# Patient Record
Sex: Male | Born: 1945 | Race: White | Hispanic: No | Marital: Married | Smoking: Never smoker
Health system: Southern US, Community
[De-identification: ages and names within clinical notes are randomized; demographics above are authoritative.]

## PROBLEM LIST (undated history)

## (undated) DIAGNOSIS — Z96653 Presence of artificial knee joint, bilateral: Secondary | ICD-10-CM

## (undated) DIAGNOSIS — N320 Bladder-neck obstruction: Secondary | ICD-10-CM

## (undated) DIAGNOSIS — N400 Enlarged prostate without lower urinary tract symptoms: Secondary | ICD-10-CM

## (undated) DIAGNOSIS — G43909 Migraine, unspecified, not intractable, without status migrainosus: Secondary | ICD-10-CM

## (undated) DIAGNOSIS — M179 Osteoarthritis of knee, unspecified: Secondary | ICD-10-CM

## (undated) DIAGNOSIS — E559 Vitamin D deficiency, unspecified: Secondary | ICD-10-CM

## (undated) DIAGNOSIS — Z973 Presence of spectacles and contact lenses: Secondary | ICD-10-CM

## (undated) DIAGNOSIS — M1710 Unilateral primary osteoarthritis, unspecified knee: Secondary | ICD-10-CM

## (undated) DIAGNOSIS — R399 Unspecified symptoms and signs involving the genitourinary system: Secondary | ICD-10-CM

## (undated) DIAGNOSIS — Z974 Presence of external hearing-aid: Secondary | ICD-10-CM

## (undated) DIAGNOSIS — N529 Male erectile dysfunction, unspecified: Secondary | ICD-10-CM

## (undated) DIAGNOSIS — H409 Unspecified glaucoma: Secondary | ICD-10-CM

## (undated) DIAGNOSIS — E785 Hyperlipidemia, unspecified: Secondary | ICD-10-CM

## (undated) HISTORY — DX: Osteoarthritis of knee, unspecified: M17.9

## (undated) HISTORY — PX: KNEE ARTHROSCOPY W/ MENISCECTOMY: SHX1879

## (undated) HISTORY — DX: Male erectile dysfunction, unspecified: N52.9

## (undated) HISTORY — DX: Presence of artificial knee joint, bilateral: Z96.653

## (undated) HISTORY — DX: Migraine, unspecified, not intractable, without status migrainosus: G43.909

## (undated) HISTORY — PX: APPENDECTOMY: SHX54

## (undated) HISTORY — PX: HERNIA REPAIR: SHX51

## (undated) HISTORY — PX: CARDIAC CATHETERIZATION: SHX172

## (undated) HISTORY — PX: VASECTOMY: SHX75

## (undated) HISTORY — DX: Unilateral primary osteoarthritis, unspecified knee: M17.10

---

## 1979-03-09 HISTORY — PX: VASECTOMY: SHX75

## 1979-03-09 HISTORY — PX: APPENDECTOMY: SHX54

## 2001-03-30 ENCOUNTER — Ambulatory Visit (HOSPITAL_COMMUNITY): Admission: RE | Admit: 2001-03-30 | Discharge: 2001-03-30 | Payer: Self-pay | Admitting: Gastroenterology

## 2003-11-23 ENCOUNTER — Encounter: Admission: RE | Admit: 2003-11-23 | Discharge: 2004-02-21 | Payer: Self-pay | Admitting: Family Medicine

## 2004-11-10 ENCOUNTER — Emergency Department (HOSPITAL_COMMUNITY): Admission: EM | Admit: 2004-11-10 | Discharge: 2004-11-10 | Payer: Self-pay | Admitting: Emergency Medicine

## 2007-11-09 ENCOUNTER — Ambulatory Visit (HOSPITAL_BASED_OUTPATIENT_CLINIC_OR_DEPARTMENT_OTHER): Admission: RE | Admit: 2007-11-09 | Discharge: 2007-11-09 | Payer: Self-pay | Admitting: Orthopedic Surgery

## 2007-11-09 ENCOUNTER — Encounter (INDEPENDENT_AMBULATORY_CARE_PROVIDER_SITE_OTHER): Payer: Self-pay | Admitting: Orthopedic Surgery

## 2008-01-26 ENCOUNTER — Inpatient Hospital Stay (HOSPITAL_BASED_OUTPATIENT_CLINIC_OR_DEPARTMENT_OTHER): Admission: RE | Admit: 2008-01-26 | Discharge: 2008-01-26 | Payer: Self-pay | Admitting: Cardiology

## 2008-10-04 ENCOUNTER — Ambulatory Visit (HOSPITAL_BASED_OUTPATIENT_CLINIC_OR_DEPARTMENT_OTHER): Admission: RE | Admit: 2008-10-04 | Discharge: 2008-10-04 | Payer: Self-pay | Admitting: Orthopedic Surgery

## 2010-10-18 LAB — POCT HEMOGLOBIN-HEMACUE: Hemoglobin: 15.7 g/dL (ref 13.0–17.0)

## 2010-11-20 NOTE — Cardiovascular Report (Signed)
NAME:  Nicholas Mahoney, Nicholas Mahoney NO.:  [id number]   MEDICAL RECORD NO.:  [id number]          PATIENT TYPE:  OIB   LOCATION:  1961                         FACILITY:  [facility]   PHYSICIAN:  Jake Bathe, MD      DATE OF BIRTH:  July 01, 1946   DATE OF PROCEDURE:  01/26/2008  DATE OF DISCHARGE:                            CARDIAC CATHETERIZATION   PROCEDURES:  1. Left heart catheterization.  2. Selective coronary angiography.  3. Left ventriculogram.   INDICATIONS:  A 65 year old male with symptoms of angina and abnormal  nuclear stress test showing inferior wall ischemia of mild degree with  normal ejection fraction.  He also has a small LDL particle size.   PROCEDURE DETAILS:  Informed consent was obtained.  The patient was  placed on the catheterization table and prepped in a sterile fashion.  Lidocaine 1% was used for infiltration of the right groin for local  anesthesia.  After visualizing the femoral head with fluoroscopy, a 4-  French sheath was placed in the right femoral artery using the modified  Seldinger technique.  A Judkins left #4 catheter was used to selectively  cannulate the left main artery.  Multiple views with hand injection of  Omnipaque were obtained.  This catheter was exchanged over the wire for  a no torque Williams right catheter, which was used to selectively  cannulate the right coronary artery.  Multiple views with hand injection  of Omnipaque were obtained.  This catheter was exchanged for an angled  pigtail over the wire, which was used across the aortic valve into the  left ventricle.  Hemodynamics obtained.  Utilizing 30 mL of dye with  power injection in the RAO position, the left ventriculogram was  obtained.  Following the procedure, sheath and catheters were removed.  Manual compression held.  The patient tolerated the procedure well with  no difficulties.   FINDINGS:  1. Left main artery - no angiographically significant coronary artery     disease.  2. Left anterior descending artery - no angiographically significant      coronary artery disease, two large diagonal branches wraps around      the apex.  3. Circumflex artery, no angiographically significant coronary artery      disease.  There are three obtuse marginal branches.  The first of      which has a very proximal takeoff.  If anything, there was very      minor narrowing of the ostium of the circumflex less than 10%.  4. Right coronary artery - this is a large dominant vessel giving rise      to the PDA.  There is no angiographically significant coronary      artery disease.  5. Left ventriculogram - normal left ventricular ejection fraction      estimated at 60% with no wall motion abnormalities.  No significant      mitral regurgitation.  6. Hemodynamics.  Left ventricular systolic pressure was 98 with a      left ventricular end-diastolic pressure of 13 mmHg.  Aortic      pressure was 98/52 with  a mean pressures at 72 mmHg.  There was no      gradient between the left ventricle and aorta.   IMPRESSION:  1. No angiographically significant coronary artery disease.  2. Normal left ventricular ejection fraction with no wall motion      abnormalities.  3. False positive nuclear stress test with no evidence of CAD in the      inferior wall distribution.   PLAN:  We will continue Zocor to hopefully increase LDL particle size,  may need to add Niaspan or fenofibrate.  We will be rechecking lipids in  the clinic.  Okay with me to stop metoprolol given that there is no  evidence of significant stenosis.  We will see back in 2 weeks.      Jake Bathe, MD  Electronically Signed     MCS/MEDQ  D:  01/26/2008  T:  01/27/2008  Job:  [id number]   cc:   Pam Drown, M.D.

## 2010-11-20 NOTE — Op Note (Signed)
NAME:  Nicholas Mahoney, Nicholas Mahoney NO.:  [id number]   MEDICAL RECORD NO.:  [id number]          PATIENT TYPE:  AMB   LOCATION:  DSC                          FACILITY:  [facility]   PHYSICIAN:  Robert A. Thurston Hole, M.D. DATE OF BIRTH:  Aug 22, 1945   DATE OF PROCEDURE:  11/09/2007  DATE OF DISCHARGE:                               OPERATIVE REPORT   PREOPERATIVE DIAGNOSES:  1. 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  2. Right knee possible gout.   POSTOPERATIVE DIAGNOSES:  1. 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  2. Right knee possible gout.   PROCEDURES:  1. Right knee examination under anesthesia followed by orthoscopic      partial medial and lateral meniscectomies.  2. Right knee chondroplasty with partial synovectomy.  3. Right knee synovial biopsy.   SURGEON:  Elana Alm. Thurston Hole, MD   ASSISTANT:  Julien Girt, PA   ANESTHESIA:  General   OPERATIVE TIME:  45 minutes.   COMPLICATIONS:  None.   INDICATION FOR PROCEDURE:  Mr. Roethler is a 65 year old man who has had  significant right knee pain for the past 3 months, much worse over the  past 2-3 weeks with examined x-rays consistent with meniscal tearing,  chondromalacia, and synovitis.  He has failed conservative care, and he  is now to undergo arthroscopy.   DESCRIPTION:  Mr. Graeff was brought to the operating room on Nov 09, 2007, after a knee block was placed in holding area by anesthesia.  He  was placed on operating table in supine position.  His right knee was  examined under anesthesia.  He had full range of motion with stable  ligamentous exam with normal patellar tracking.  The right leg was  prepped using sterile DuraPrep and draped using sterile technique.  Originally, through an anterolateral portal, the arthroscope with a pump  attached was placed and through an anteromedial portal, an arthroscopic  probe  was placed.  On initial inspection of the medial compartment, he  had 30-40% grade 3 chondromalacia with some articular cartilage  crystalline type deposits which was debrided and some of these were sent  for samples to evaluate him for possible gout.  Medial meniscus showed  tearing in the posterior and medial horn, of which 50%  was resected  back to stable rim.  This also had crystalline type deposits and it is  well and portions of this were also sent for biopsies as well.  Intercondylar notch inspected.  Anterior and posterior crucial ligaments  were normal.  Lateral compartment was inspected.  The articular  cartilage showed 30% grade 3 chondromalacia, which was debrided.  Lateral meniscus tearing of posterolateral and anterior horn of which 30-  40% was resected back to a stable rim.  Patellofemoral joint showed 30-  40% grade 3 chondromalacia, which was debrided.  The patella tracked  normally.  Moderate synovitis and medial and lateral gutters were  debrided and again, the synovial biopsies were taken to evaluate for  crystal deposits and  gout.  Otherwise, the medial and lateral gutters  were free of pathology.  At this point, it was felt that all pathology  have been satisfactorily addressed.  The instruments were removed.  The  portals were closed with 3-0 nylon suture and injected with 0.25%  Marcaine with epinephrine and 4 mg of morphine.  Sterile dressings were  applied, and the patient awakened and taken to the recovery room in  stable condition.   FOLLOWUP CARE:  Mr. Oelkers will be followed as an outpatient on  Vicodin and Indocin.  He will be seen back in the office in a week for  sutures out and followup.      Robert A. Thurston Hole, M.D.  Electronically Signed     RAW/MEDQ  D:  11/09/2007  T:  11/09/2007  Job:  [id number]

## 2010-11-20 NOTE — Op Note (Signed)
NAME:  Nicholas Mahoney, Nicholas Mahoney NO.:  [id number]   MEDICAL RECORD NO.:  [id number]          PATIENT TYPE:  AMB   LOCATION:  DSC                          FACILITY:  [facility]   PHYSICIAN:  Robert A. Thurston Hole, M.D. DATE OF BIRTH:  10-Jun-1946   DATE OF PROCEDURE:  10/04/2008  DATE OF DISCHARGE:                               OPERATIVE REPORT   PREOPERATIVE DIAGNOSES:  1. Left knee medial and lateral meniscal tears with chondromalacia and      synovitis.  2. Left knee chondrocalcinosis.   POSTOPERATIVE DIAGNOSES:  1. Left knee medial and lateral meniscal tears with chondromalacia and      synovitis.  2. Left knee chondrocalcinosis.   PROCEDURE:  1. Left knee examination under anesthesia followed by arthroscopic      partial medial and lateral meniscectomies.  2. Left knee chondroplasty with partial synovectomy.   SURGEON:  Elana Alm. Thurston Hole, M.D.   ASSISTANT:  Julien Girt, P.A.   ANESTHESIA:  General.   OPERATIVE TIME:  30 minutes.   COMPLICATIONS:  None.   INDICATION FOR PROCEDURE:  Nicholas Mahoney is a 65 year old gentleman who  has had significant left knee pain for the past 3-4 months, increasing  in nature with examined x-rays consistent with meniscal tearing with  chondromalacia, chondrocalcinosis, and synovitis.  He has failed  conservative care, and he is now to undergo arthroscopy.   DESCRIPTION:  Nicholas Mahoney was brought to the operating room on October 04, 2008, placed on operating table in supine position.  After a knee  block has been placed in holding area by anesthesia.  He was placed in  general anesthesia.  His left knee was examined.  He had full range of  motion.  He has stable at ligamentous exam with normal patellar  tracking.  He received Ancef 1 g IV preoperatively for prophylaxis.  His  left leg was prepped using sterile DuraPrep and draped using sterile  technique.  Originally, through an anterolateral portal, the arthroscope  with a pump  attached was placed and through an anteromedial portal, an  arthroscopic probe was placed.  On initial inspection of the medial  compartment, he was found to have 30-40% grade 3 chondromalacia which  was debrided.  Medial meniscus showed tearing in the posterior and  medial horn and along with calcific deposits consistent with  chondrocalcinosis.  A 50% of posterior medial horn was resected back to  a stable rim.  Intercondylar notch inspected.  The anterior and  posterior crucial ligaments were normal.  Lateral compartment was  inspected.  Deposits which 30-40% was resected back to a stable rim.  Patellofemoral joint showed grade 1 and 2 chondromalacia.  The patella  tracked normally.  Moderate synovitis and medial and lateral gutters  were debrided otherwise just free of pathology.  After this was done, it  was felt that all pathology have been satisfactorily addressed.  The  instruments were removed.  The portals were closed with 3-0 nylon  suture.  Sterile dressings were applied.  The patient awakened and taken  to the recovery room in stable condition.   FOLLOWUP  CARE:  Nicholas Mahoney will be followed as an outpatient on  Vicodin and Mobic.  Seen back in the office in a week for sutures out  and followup.      Robert A. Thurston Hole, M.D.  Electronically Signed     RAW/MEDQ  D:  10/04/2008  T:  10/05/2008  Job:  [id number]

## 2010-11-23 NOTE — Procedures (Signed)
[facility]  Patient:    Nicholas Mahoney, Nicholas Mahoney Visit Number: [id number] MRN: [id number]          Service Type: END Location: ENDO Attending Physician:  Orland Mustard Proc. Date: 03/30/01 Admit Date:  03/30/2001   CC:         Heather Roberts, M.D.   Procedure Report  PROCEDURE:  Colonoscopy.  SCOPE:  Olympus adult video colonoscope.  MEDICATIONS:  Fentanyl 75 mcg, Versed 6 mg IV.  INDICATION:  Colon cancer screening.  DESCRIPTION OF PROCEDURE:  The procedure had been explained to the patient and consent obtained.  With the patient in the left lateral decubitus position, the Olympus adult video colonoscope was inserted and advanced under direct visualization.  The prep was quite good.  We were able to reach the cecum without difficulty.  The appendiceal orifice and the ileocecal valve were seen.  The scope was withdrawn.  The cecum, ascending colon, hepatic flexure, transverse colon, splenic flexure, descending, and sigmoid colon were seen well upon removal.  No polyps or other lesions were seen.  There was no significant diverticulosis.  Only small if any internal hemorrhoids.  The scope was withdrawn.  The patient tolerated the procedure well.  ASSESSMENT:  Essentially normal colonoscopy.  PLAN:  Routine follow-up with yearly Hemoccults.  Sigmoid in five years. Consider colon in 10 years. Attending Physician:  Orland Mustard DD:  03/30/01 TD:  03/30/01 Job: [id number]

## 2011-07-23 DIAGNOSIS — F4321 Adjustment disorder with depressed mood: Secondary | ICD-10-CM | POA: Diagnosis not present

## 2011-08-12 DIAGNOSIS — D4959 Neoplasm of unspecified behavior of other genitourinary organ: Secondary | ICD-10-CM | POA: Diagnosis not present

## 2011-08-15 DIAGNOSIS — N139 Obstructive and reflux uropathy, unspecified: Secondary | ICD-10-CM | POA: Diagnosis not present

## 2011-08-15 DIAGNOSIS — N529 Male erectile dysfunction, unspecified: Secondary | ICD-10-CM | POA: Diagnosis not present

## 2011-08-22 DIAGNOSIS — L570 Actinic keratosis: Secondary | ICD-10-CM | POA: Diagnosis not present

## 2011-09-19 DIAGNOSIS — J069 Acute upper respiratory infection, unspecified: Secondary | ICD-10-CM | POA: Diagnosis not present

## 2011-10-03 DIAGNOSIS — H18519 Endothelial corneal dystrophy, unspecified eye: Secondary | ICD-10-CM | POA: Diagnosis not present

## 2011-11-28 DIAGNOSIS — Z0389 Encounter for observation for other suspected diseases and conditions ruled out: Secondary | ICD-10-CM | POA: Diagnosis not present

## 2011-11-28 DIAGNOSIS — E785 Hyperlipidemia, unspecified: Secondary | ICD-10-CM | POA: Diagnosis not present

## 2011-11-28 DIAGNOSIS — N529 Male erectile dysfunction, unspecified: Secondary | ICD-10-CM | POA: Diagnosis not present

## 2011-12-18 DIAGNOSIS — M109 Gout, unspecified: Secondary | ICD-10-CM | POA: Diagnosis not present

## 2011-12-18 DIAGNOSIS — M779 Enthesopathy, unspecified: Secondary | ICD-10-CM | POA: Diagnosis not present

## 2012-03-05 DIAGNOSIS — L819 Disorder of pigmentation, unspecified: Secondary | ICD-10-CM | POA: Diagnosis not present

## 2012-03-05 DIAGNOSIS — Z85828 Personal history of other malignant neoplasm of skin: Secondary | ICD-10-CM | POA: Diagnosis not present

## 2012-03-05 DIAGNOSIS — D239 Other benign neoplasm of skin, unspecified: Secondary | ICD-10-CM | POA: Diagnosis not present

## 2012-03-05 DIAGNOSIS — L570 Actinic keratosis: Secondary | ICD-10-CM | POA: Diagnosis not present

## 2012-03-05 DIAGNOSIS — L821 Other seborrheic keratosis: Secondary | ICD-10-CM | POA: Diagnosis not present

## 2012-03-05 DIAGNOSIS — D1801 Hemangioma of skin and subcutaneous tissue: Secondary | ICD-10-CM | POA: Diagnosis not present

## 2012-03-05 DIAGNOSIS — D485 Neoplasm of uncertain behavior of skin: Secondary | ICD-10-CM | POA: Diagnosis not present

## 2012-03-30 DIAGNOSIS — Z23 Encounter for immunization: Secondary | ICD-10-CM | POA: Diagnosis not present

## 2012-04-27 DIAGNOSIS — Z79899 Other long term (current) drug therapy: Secondary | ICD-10-CM | POA: Diagnosis not present

## 2012-04-27 DIAGNOSIS — Z0000 Encounter for general adult medical examination without abnormal findings: Secondary | ICD-10-CM | POA: Diagnosis not present

## 2012-04-27 DIAGNOSIS — E785 Hyperlipidemia, unspecified: Secondary | ICD-10-CM | POA: Diagnosis not present

## 2012-04-27 DIAGNOSIS — G609 Hereditary and idiopathic neuropathy, unspecified: Secondary | ICD-10-CM | POA: Diagnosis not present

## 2012-06-23 DIAGNOSIS — Z20828 Contact with and (suspected) exposure to other viral communicable diseases: Secondary | ICD-10-CM | POA: Diagnosis not present

## 2012-06-23 DIAGNOSIS — E785 Hyperlipidemia, unspecified: Secondary | ICD-10-CM | POA: Diagnosis not present

## 2012-06-23 DIAGNOSIS — G43009 Migraine without aura, not intractable, without status migrainosus: Secondary | ICD-10-CM | POA: Diagnosis not present

## 2012-06-23 DIAGNOSIS — N400 Enlarged prostate without lower urinary tract symptoms: Secondary | ICD-10-CM | POA: Diagnosis not present

## 2012-06-23 DIAGNOSIS — Z79899 Other long term (current) drug therapy: Secondary | ICD-10-CM | POA: Diagnosis not present

## 2012-06-23 DIAGNOSIS — N529 Male erectile dysfunction, unspecified: Secondary | ICD-10-CM | POA: Diagnosis not present

## 2012-06-23 DIAGNOSIS — Z131 Encounter for screening for diabetes mellitus: Secondary | ICD-10-CM | POA: Diagnosis not present

## 2012-06-23 DIAGNOSIS — M533 Sacrococcygeal disorders, not elsewhere classified: Secondary | ICD-10-CM | POA: Diagnosis not present

## 2012-07-11 DIAGNOSIS — J019 Acute sinusitis, unspecified: Secondary | ICD-10-CM | POA: Diagnosis not present

## 2012-08-24 DIAGNOSIS — N401 Enlarged prostate with lower urinary tract symptoms: Secondary | ICD-10-CM | POA: Diagnosis not present

## 2012-08-24 DIAGNOSIS — N138 Other obstructive and reflux uropathy: Secondary | ICD-10-CM | POA: Diagnosis not present

## 2012-08-25 DIAGNOSIS — R5383 Other fatigue: Secondary | ICD-10-CM | POA: Diagnosis not present

## 2012-08-25 DIAGNOSIS — R5381 Other malaise: Secondary | ICD-10-CM | POA: Diagnosis not present

## 2012-08-25 DIAGNOSIS — G4700 Insomnia, unspecified: Secondary | ICD-10-CM | POA: Diagnosis not present

## 2012-08-25 DIAGNOSIS — G43109 Migraine with aura, not intractable, without status migrainosus: Secondary | ICD-10-CM | POA: Diagnosis not present

## 2012-08-25 DIAGNOSIS — N529 Male erectile dysfunction, unspecified: Secondary | ICD-10-CM | POA: Diagnosis not present

## 2012-08-26 ENCOUNTER — Emergency Department (INDEPENDENT_AMBULATORY_CARE_PROVIDER_SITE_OTHER): Payer: Medicare Other

## 2012-08-26 ENCOUNTER — Emergency Department (INDEPENDENT_AMBULATORY_CARE_PROVIDER_SITE_OTHER)
Admission: EM | Admit: 2012-08-26 | Discharge: 2012-08-26 | Disposition: A | Discharge status: Home / Self Care | Payer: Medicare Other | Source: Home / Self Care | Attending: Family Medicine | Admitting: Family Medicine

## 2012-08-26 ENCOUNTER — Encounter (HOSPITAL_COMMUNITY): Payer: Self-pay

## 2012-08-26 DIAGNOSIS — S81009A Unspecified open wound, unspecified knee, initial encounter: Secondary | ICD-10-CM

## 2012-08-26 DIAGNOSIS — N138 Other obstructive and reflux uropathy: Secondary | ICD-10-CM | POA: Diagnosis not present

## 2012-08-26 DIAGNOSIS — N529 Male erectile dysfunction, unspecified: Secondary | ICD-10-CM | POA: Diagnosis not present

## 2012-08-26 DIAGNOSIS — S81811A Laceration without foreign body, right lower leg, initial encounter: Secondary | ICD-10-CM

## 2012-08-26 DIAGNOSIS — Z23 Encounter for immunization: Secondary | ICD-10-CM

## 2012-08-26 DIAGNOSIS — S99929A Unspecified injury of unspecified foot, initial encounter: Secondary | ICD-10-CM | POA: Diagnosis not present

## 2012-08-26 DIAGNOSIS — S8990XA Unspecified injury of unspecified lower leg, initial encounter: Secondary | ICD-10-CM | POA: Diagnosis not present

## 2012-08-26 DIAGNOSIS — S91009A Unspecified open wound, unspecified ankle, initial encounter: Secondary | ICD-10-CM | POA: Diagnosis not present

## 2012-08-26 MED ORDER — HYDROCODONE-ACETAMINOPHEN 5-325 MG PO TABS: 1.0000 | ORAL_TABLET | Freq: Three times a day (TID) | ORAL | Status: DC | PRN

## 2012-08-26 MED ORDER — ONDANSETRON 4 MG PO TBDP
4.0000 mg | ORAL_TABLET | Freq: Once | ORAL | Status: AC
  Administered 2012-08-26: 4 mg via ORAL

## 2012-08-26 MED ORDER — TETANUS-DIPHTH-ACELL PERTUSSIS 5-2.5-18.5 LF-MCG/0.5 IM SUSP
0.5000 mL | Freq: Once | INTRAMUSCULAR | Status: AC
  Administered 2012-08-26: 0.5 mL via INTRAMUSCULAR

## 2012-08-26 MED ORDER — CEPHALEXIN 500 MG PO CAPS: 500.0000 mg | ORAL_CAPSULE | Freq: Three times a day (TID) | ORAL | Status: DC

## 2012-08-26 MED ORDER — ONDANSETRON 4 MG PO TBDP
ORAL_TABLET | ORAL | Status: AC
  Filled 2012-08-26: qty 2

## 2012-08-26 MED ORDER — HYDROCODONE-ACETAMINOPHEN 5-325 MG PO TABS
ORAL_TABLET | ORAL | Status: AC
  Filled 2012-08-26: qty 1

## 2012-08-26 MED ORDER — IBUPROFEN 600 MG PO TABS: 600.0000 mg | ORAL_TABLET | Freq: Three times a day (TID) | ORAL | Status: DC | PRN

## 2012-08-26 MED ORDER — TETANUS-DIPHTH-ACELL PERTUSSIS 5-2.5-18.5 LF-MCG/0.5 IM SUSP
INTRAMUSCULAR | Status: AC
  Filled 2012-08-26: qty 0.5

## 2012-08-26 MED ORDER — HYDROCODONE-ACETAMINOPHEN 5-325 MG PO TABS
1.0000 | ORAL_TABLET | Freq: Once | ORAL | Status: AC
  Administered 2012-08-26: 1 via ORAL

## 2012-08-26 NOTE — ED Notes (Signed)
Vitals obtained by xray student 

## 2012-08-26 NOTE — ED Notes (Signed)
Volunteer for [employer], states he slipped, fell, struck right mid tibial prominence on building materials, sustained 10cm x 10 cm V shaped laceration, minimal bleeding at present, no current hematoma. NAD. Denies other injury

## 2012-08-31 NOTE — ED Provider Notes (Signed)
History     CSN: [id number]  Arrival date & time 08/26/12  1347   First MD Initiated Contact with Patient 08/26/12 1423      Chief Complaint  Patient presents with  . Extremity Laceration    (Consider location/radiation/quality/duration/timing/severity/associated sxs/prior treatment) HPI Comments: 67 y/o non diabetic, non smoker male comes with a laceration in right lower leg. Patient is a Agricultural consultant for [employer] and was standing over a bath tub when his foot slipped down hitting his right tibia against the tub causing a big cut. Patient states there was a lot of bleeding but it stooped now. He is weight bearing on right lower extremity. Needs tetanus booster. No head trauma or other body injuries.   Past Medical History  Diagnosis Date  . High cholesterol     History reviewed. No pertinent past surgical history.  History reviewed. No pertinent family history.  History  Substance Use Topics  . Smoking status: Never Smoker   . Smokeless tobacco: Not on file  . Alcohol Use: 0.0 oz/week    1-2 Glasses of wine per week      Review of Systems  HENT:       No head trauma  Eyes: Negative for visual disturbance.  Skin: Positive for wound.       As per HPI  Neurological: Negative for dizziness, seizures, syncope, weakness, numbness and headaches.  All other systems reviewed and are negative.    Allergies  Review of patient's allergies indicates no known allergies.  Home Medications   Current Outpatient Rx  Name  Route  Sig  Dispense  Refill  . aspirin 81 MG tablet   Oral   Take 81 mg by mouth daily.         . ergocalciferol (VITAMIN D2) 50000 UNITS capsule   Oral   Take 50,000 Units by mouth once a week.         . simvastatin (ZOCOR) 10 MG tablet   Oral   Take 10 mg by mouth at bedtime.         . cephALEXin (KEFLEX) 500 MG capsule   Oral   Take 1 capsule (500 mg total) by mouth 3 (three) times daily.   21 capsule   0   .  HYDROcodone-acetaminophen (NORCO/VICODIN) 5-325 MG per tablet   Oral   Take 1 tablet by mouth every 8 (eight) hours as needed for pain.   15 tablet   0   . ibuprofen (ADVIL,MOTRIN) 600 MG tablet   Oral   Take 1 tablet (600 mg total) by mouth every 8 (eight) hours as needed for pain.   20 tablet   0     BP 103/64  Pulse 66  Temp(Src) 98.1 F (36.7 C) (Oral)  Resp 20  SpO2 99%  Physical Exam  Nursing note and vitals reviewed. Constitutional: He is oriented to person, place, and time. He appears well-developed and well-nourished. No distress.  HENT:  Head: Normocephalic and atraumatic.  Eyes: EOM are normal. Pupils are equal, round, and reactive to light.  Neck: Neck supple.  Cardiovascular: Normal rate, regular rhythm and normal heart sounds.   Pulmonary/Chest: Effort normal and breath sounds normal.  Musculoskeletal:  Right lower extremity: there is a flap laceration abot 10x10cm (V shaped) located anterior to mid tibial bone. Flap is retracted exposing the muscle fascia. Mild bleeding. No obvious hematoma. Tibial bone appears intact.  Neurological: He is alert and oriented to person, place, and time.  Skin:  He is not diaphoretic.  Flap laceration in anterior mid right tibial bone as per Musculoeskeletal exam.    ED Course  LACERATION REPAIR Performed by: Sharin Grave Authorized by: Sharin Grave Consent: Verbal consent obtained. Risks and benefits: risks, benefits and alternatives were discussed Consent given by: patient Patient understanding: patient states understanding of the procedure being performed Patient consent: the patient's understanding of the procedure matches consent given Body area: lower extremity Location details: left lower leg Laceration length: 20 cm Foreign bodies: no foreign bodies Tendon involvement: none Nerve involvement: none Vascular damage: no Anesthesia: local infiltration Local anesthetic: lidocaine 1% with  epinephrine Anesthetic total: 5 ml Preparation: Patient was prepped and draped in the usual sterile fashion. Irrigation solution: saline Amount of cleaning: standard Degree of undermining: minimal Skin closure: 3-0 nylon and Steri-Strips Subcutaneous closure: 6-0 fast-absorbing plain gut Number of sutures: 19 Technique: simple Approximation: close Approximation difficulty: complex Dressing: non-adhesive packing strip Patient tolerance: Patient tolerated the procedure well with no immediate complications.   (including critical care time)  Labs Reviewed - No data to display No results found.   1. Laceration of lower leg, right       MDM  Large flap laceration s/p repair today. TDaP provided. No bone fx on x-rays. Patient was given hydrocodone/acetaminophen 325mg /5 mg and ondansetron here. Wife will drive home. Prescribed Norco, ibuprofen and cephalexin. Wound care instructions discussed with patient ans provided in writing.  Supportive care and red flags that should prompt his return discussed with patient and provided in writing.  Asked to return in 10 days for suture removal.        Sharin Grave, MD 08/31/12 [phone]

## 2012-09-07 ENCOUNTER — Encounter (HOSPITAL_COMMUNITY): Payer: Self-pay

## 2012-09-07 ENCOUNTER — Emergency Department (INDEPENDENT_AMBULATORY_CARE_PROVIDER_SITE_OTHER)
Admission: EM | Admit: 2012-09-07 | Discharge: 2012-09-07 | Disposition: A | Discharge status: Home / Self Care | Payer: Medicare Other | Source: Home / Self Care

## 2012-09-07 DIAGNOSIS — Z4802 Encounter for removal of sutures: Secondary | ICD-10-CM | POA: Diagnosis not present

## 2012-09-07 NOTE — ED Notes (Signed)
Here for wound check and poss suture removal; wound edges eschar present w reddened margins

## 2012-09-07 NOTE — ED Provider Notes (Signed)
History     CSN: [id number]  Arrival date & time 09/07/12  1321   First MD Initiated Contact with Patient 09/07/12 1330      Chief Complaint  Patient presents with  . Wound Check    (Consider location/radiation/quality/duration/timing/severity/associated sxs/prior treatment) HPI Comments: 67 year old man presents for suture removal. Approximately 12 days ago he fell from a ladder and produced a V-shaped laceration to the right shin. The wound was sutured with nonabsorbable nylon. A portion of the the superficial epidermis is not completely closed. He has been 12 days. There is mild hyperemia along the wound edges but no signs of infection, no puffiness or drainage.   Past Medical History  Diagnosis Date  . High cholesterol     History reviewed. No pertinent past surgical history.  History reviewed. No pertinent family history.  History  Substance Use Topics  . Smoking status: Never Smoker   . Smokeless tobacco: Not on file  . Alcohol Use: 0.0 oz/week    1-2 Glasses of wine per week      Review of Systems  All other systems reviewed and are negative.    Allergies  Review of patient's allergies indicates no known allergies.  Home Medications   Current Outpatient Rx  Name  Route  Sig  Dispense  Refill  . aspirin 81 MG tablet   Oral   Take 81 mg by mouth daily.         . cephALEXin (KEFLEX) 500 MG capsule   Oral   Take 1 capsule (500 mg total) by mouth 3 (three) times daily.   21 capsule   0   . ergocalciferol (VITAMIN D2) 50000 UNITS capsule   Oral   Take 50,000 Units by mouth once a week.         Marland Kitchen HYDROcodone-acetaminophen (NORCO/VICODIN) 5-325 MG per tablet   Oral   Take 1 tablet by mouth every 8 (eight) hours as needed for pain.   15 tablet   0   . ibuprofen (ADVIL,MOTRIN) 600 MG tablet   Oral   Take 1 tablet (600 mg total) by mouth every 8 (eight) hours as needed for pain.   20 tablet   0   . simvastatin (ZOCOR) 10 MG tablet   Oral  Take 10 mg by mouth at bedtime.           BP 133/81  Pulse 74  Temp(Src) 97.9 F (36.6 C) (Oral)  Resp 22  SpO2 98%  Physical Exam  Nursing note and vitals reviewed. Constitutional: He is oriented to person, place, and time. He appears well-developed and well-nourished. No distress.  Pulmonary/Chest: Effort normal.  Neurological: He is alert and oriented to person, place, and time.  Skin: Skin is warm and dry.  Psychiatric: He has a normal mood and affect.    ED Course  Procedures (including critical care time)  Labs Reviewed - No data to display No results found.   1. Visit for suture removal       MDM  All sutures were removed. Because the epidermis was not completely closed, although well approximated, I applied Steri-Strips along the wound edges. Maintain these for 3-4 more days. Try to keep it dry does; not need to apply any sort of ointment. Any signs of infection recheck promptly.         Hayden Rasmussen, NP 09/07/12 [phone]

## 2012-09-07 NOTE — ED Provider Notes (Signed)
Medical screening examination/treatment/procedure(s) were performed by resident physician or non-physician practitioner and as supervising physician I was immediately available for consultation/collaboration.   KINDL,JAMES DOUGLAS MD.   James D Kindl, MD 09/07/12 1638 

## 2012-09-22 DIAGNOSIS — N400 Enlarged prostate without lower urinary tract symptoms: Secondary | ICD-10-CM | POA: Diagnosis not present

## 2012-09-22 DIAGNOSIS — E291 Testicular hypofunction: Secondary | ICD-10-CM | POA: Diagnosis not present

## 2012-10-28 DIAGNOSIS — E785 Hyperlipidemia, unspecified: Secondary | ICD-10-CM | POA: Diagnosis not present

## 2012-10-28 DIAGNOSIS — Z79899 Other long term (current) drug therapy: Secondary | ICD-10-CM | POA: Diagnosis not present

## 2012-11-03 DIAGNOSIS — Z79899 Other long term (current) drug therapy: Secondary | ICD-10-CM | POA: Diagnosis not present

## 2012-11-03 DIAGNOSIS — E291 Testicular hypofunction: Secondary | ICD-10-CM | POA: Diagnosis not present

## 2012-11-03 DIAGNOSIS — N400 Enlarged prostate without lower urinary tract symptoms: Secondary | ICD-10-CM | POA: Diagnosis not present

## 2012-11-04 DIAGNOSIS — H2589 Other age-related cataract: Secondary | ICD-10-CM | POA: Diagnosis not present

## 2012-11-27 DIAGNOSIS — E785 Hyperlipidemia, unspecified: Secondary | ICD-10-CM | POA: Diagnosis not present

## 2012-11-27 DIAGNOSIS — Z0389 Encounter for observation for other suspected diseases and conditions ruled out: Secondary | ICD-10-CM | POA: Diagnosis not present

## 2012-11-27 DIAGNOSIS — R609 Edema, unspecified: Secondary | ICD-10-CM | POA: Diagnosis not present

## 2013-02-10 ENCOUNTER — Other Ambulatory Visit: Payer: Self-pay

## 2013-02-16 DIAGNOSIS — N400 Enlarged prostate without lower urinary tract symptoms: Secondary | ICD-10-CM | POA: Diagnosis not present

## 2013-02-16 DIAGNOSIS — E291 Testicular hypofunction: Secondary | ICD-10-CM | POA: Diagnosis not present

## 2013-02-16 DIAGNOSIS — N529 Male erectile dysfunction, unspecified: Secondary | ICD-10-CM | POA: Diagnosis not present

## 2013-03-11 DIAGNOSIS — L570 Actinic keratosis: Secondary | ICD-10-CM | POA: Diagnosis not present

## 2013-03-11 DIAGNOSIS — L819 Disorder of pigmentation, unspecified: Secondary | ICD-10-CM | POA: Diagnosis not present

## 2013-03-11 DIAGNOSIS — D1801 Hemangioma of skin and subcutaneous tissue: Secondary | ICD-10-CM | POA: Diagnosis not present

## 2013-03-11 DIAGNOSIS — Z85828 Personal history of other malignant neoplasm of skin: Secondary | ICD-10-CM | POA: Diagnosis not present

## 2013-03-11 DIAGNOSIS — L821 Other seborrheic keratosis: Secondary | ICD-10-CM | POA: Diagnosis not present

## 2013-03-11 DIAGNOSIS — D239 Other benign neoplasm of skin, unspecified: Secondary | ICD-10-CM | POA: Diagnosis not present

## 2013-03-30 DIAGNOSIS — E291 Testicular hypofunction: Secondary | ICD-10-CM | POA: Diagnosis not present

## 2013-04-03 ENCOUNTER — Other Ambulatory Visit: Payer: Self-pay | Admitting: Cardiology

## 2013-04-03 DIAGNOSIS — E785 Hyperlipidemia, unspecified: Secondary | ICD-10-CM

## 2013-04-03 DIAGNOSIS — Z79899 Other long term (current) drug therapy: Secondary | ICD-10-CM

## 2013-04-23 DIAGNOSIS — Z23 Encounter for immunization: Secondary | ICD-10-CM | POA: Diagnosis not present

## 2013-05-10 ENCOUNTER — Other Ambulatory Visit (INDEPENDENT_AMBULATORY_CARE_PROVIDER_SITE_OTHER): Payer: Medicare Other

## 2013-05-10 DIAGNOSIS — Z79899 Other long term (current) drug therapy: Secondary | ICD-10-CM | POA: Diagnosis not present

## 2013-05-10 DIAGNOSIS — E785 Hyperlipidemia, unspecified: Secondary | ICD-10-CM

## 2013-05-10 LAB — LIPID PANEL
Cholesterol: 140 mg/dL (ref 0–200)
VLDL: 13.6 mg/dL (ref 0.0–40.0)

## 2013-05-13 ENCOUNTER — Telehealth: Payer: Self-pay | Admitting: *Deleted

## 2013-05-13 ENCOUNTER — Other Ambulatory Visit: Payer: Self-pay

## 2013-05-13 ENCOUNTER — Telehealth: Payer: Self-pay | Admitting: Cardiology

## 2013-05-13 NOTE — Telephone Encounter (Signed)
Following up     Pt would like a  MyChart pass code and please call him back around 11:30am or later with his test results .   He can take the call then.

## 2013-05-13 NOTE — Telephone Encounter (Signed)
lmtcb for lab results. Number provided 

## 2013-05-14 ENCOUNTER — Encounter: Payer: Self-pay | Admitting: Cardiology

## 2013-05-17 NOTE — Telephone Encounter (Signed)
Left message for patient to call the office

## 2013-05-25 DIAGNOSIS — E291 Testicular hypofunction: Secondary | ICD-10-CM | POA: Diagnosis not present

## 2013-05-25 DIAGNOSIS — Z79899 Other long term (current) drug therapy: Secondary | ICD-10-CM | POA: Diagnosis not present

## 2013-05-31 DIAGNOSIS — H903 Sensorineural hearing loss, bilateral: Secondary | ICD-10-CM | POA: Diagnosis not present

## 2013-06-08 ENCOUNTER — Other Ambulatory Visit: Payer: Self-pay | Admitting: Cardiology

## 2013-06-08 ENCOUNTER — Telehealth: Payer: Self-pay | Admitting: Cardiology

## 2013-06-08 MED ORDER — SIMVASTATIN 20 MG PO TABS: 20.0000 mg | ORAL_TABLET | Freq: Every day | ORAL | Status: DC

## 2013-06-08 NOTE — Telephone Encounter (Signed)
Follow up      Pt has been trying for 7 days to get this refill [facility]. [address].   Pt  Needs this today. Pt is upset now.

## 2013-06-08 NOTE — Telephone Encounter (Signed)
New Problem:  Pt states he wants to speak with Nicholas Mahoney about his statin refill.

## 2013-06-08 NOTE — Telephone Encounter (Signed)
Patient needs simvastatin refill sent into [facility] on [address].  Patient actually on simvastatin 20 mg qd, not 10 mg qd.  Med list updated, and refill sent in.

## 2013-06-10 NOTE — Telephone Encounter (Signed)
Three attempts to contact patient if patient calls the office ,advise patient that he can call the MyChart customer service line and they can provide him with a access code to get into MyChart.

## 2013-06-11 DIAGNOSIS — L821 Other seborrheic keratosis: Secondary | ICD-10-CM | POA: Diagnosis not present

## 2013-06-24 DIAGNOSIS — E785 Hyperlipidemia, unspecified: Secondary | ICD-10-CM | POA: Diagnosis not present

## 2013-06-24 DIAGNOSIS — E559 Vitamin D deficiency, unspecified: Secondary | ICD-10-CM | POA: Diagnosis not present

## 2013-06-24 DIAGNOSIS — Z79899 Other long term (current) drug therapy: Secondary | ICD-10-CM | POA: Diagnosis not present

## 2013-06-24 DIAGNOSIS — G43109 Migraine with aura, not intractable, without status migrainosus: Secondary | ICD-10-CM | POA: Diagnosis not present

## 2013-06-24 DIAGNOSIS — N529 Male erectile dysfunction, unspecified: Secondary | ICD-10-CM | POA: Diagnosis not present

## 2013-06-24 DIAGNOSIS — G479 Sleep disorder, unspecified: Secondary | ICD-10-CM | POA: Diagnosis not present

## 2013-06-24 DIAGNOSIS — IMO0002 Reserved for concepts with insufficient information to code with codable children: Secondary | ICD-10-CM | POA: Diagnosis not present

## 2013-06-24 DIAGNOSIS — N400 Enlarged prostate without lower urinary tract symptoms: Secondary | ICD-10-CM | POA: Diagnosis not present

## 2013-09-01 DIAGNOSIS — N401 Enlarged prostate with lower urinary tract symptoms: Secondary | ICD-10-CM | POA: Diagnosis not present

## 2013-09-01 DIAGNOSIS — N138 Other obstructive and reflux uropathy: Secondary | ICD-10-CM | POA: Diagnosis not present

## 2013-09-01 DIAGNOSIS — N139 Obstructive and reflux uropathy, unspecified: Secondary | ICD-10-CM | POA: Diagnosis not present

## 2013-09-06 DIAGNOSIS — N401 Enlarged prostate with lower urinary tract symptoms: Secondary | ICD-10-CM | POA: Diagnosis not present

## 2013-09-06 DIAGNOSIS — N138 Other obstructive and reflux uropathy: Secondary | ICD-10-CM | POA: Diagnosis not present

## 2013-09-06 DIAGNOSIS — N529 Male erectile dysfunction, unspecified: Secondary | ICD-10-CM | POA: Diagnosis not present

## 2013-09-06 DIAGNOSIS — N139 Obstructive and reflux uropathy, unspecified: Secondary | ICD-10-CM | POA: Diagnosis not present

## 2013-09-07 DIAGNOSIS — R5381 Other malaise: Secondary | ICD-10-CM | POA: Diagnosis not present

## 2013-09-07 DIAGNOSIS — E291 Testicular hypofunction: Secondary | ICD-10-CM | POA: Diagnosis not present

## 2013-09-07 DIAGNOSIS — H918X9 Other specified hearing loss, unspecified ear: Secondary | ICD-10-CM | POA: Diagnosis not present

## 2013-09-07 DIAGNOSIS — R5383 Other fatigue: Secondary | ICD-10-CM | POA: Diagnosis not present

## 2013-09-07 DIAGNOSIS — N400 Enlarged prostate without lower urinary tract symptoms: Secondary | ICD-10-CM | POA: Diagnosis not present

## 2013-10-25 DIAGNOSIS — Z79899 Other long term (current) drug therapy: Secondary | ICD-10-CM | POA: Diagnosis not present

## 2013-10-25 DIAGNOSIS — E291 Testicular hypofunction: Secondary | ICD-10-CM | POA: Diagnosis not present

## 2013-10-28 DIAGNOSIS — Z79899 Other long term (current) drug therapy: Secondary | ICD-10-CM | POA: Diagnosis not present

## 2013-10-28 DIAGNOSIS — E291 Testicular hypofunction: Secondary | ICD-10-CM | POA: Diagnosis not present

## 2013-11-19 DIAGNOSIS — H2589 Other age-related cataract: Secondary | ICD-10-CM | POA: Diagnosis not present

## 2014-01-19 DIAGNOSIS — R198 Other specified symptoms and signs involving the digestive system and abdomen: Secondary | ICD-10-CM | POA: Diagnosis not present

## 2014-02-10 IMAGING — CR DG TIBIA/FIBULA 2V*R*
3 series · 3 of 3 positions shown · non-contrast
Comparison: None.

CLINICAL DATA: Injury

RIGHT TIBIA AND FIBULA - 2 VIEW

[view not recorded (1 of 3)]
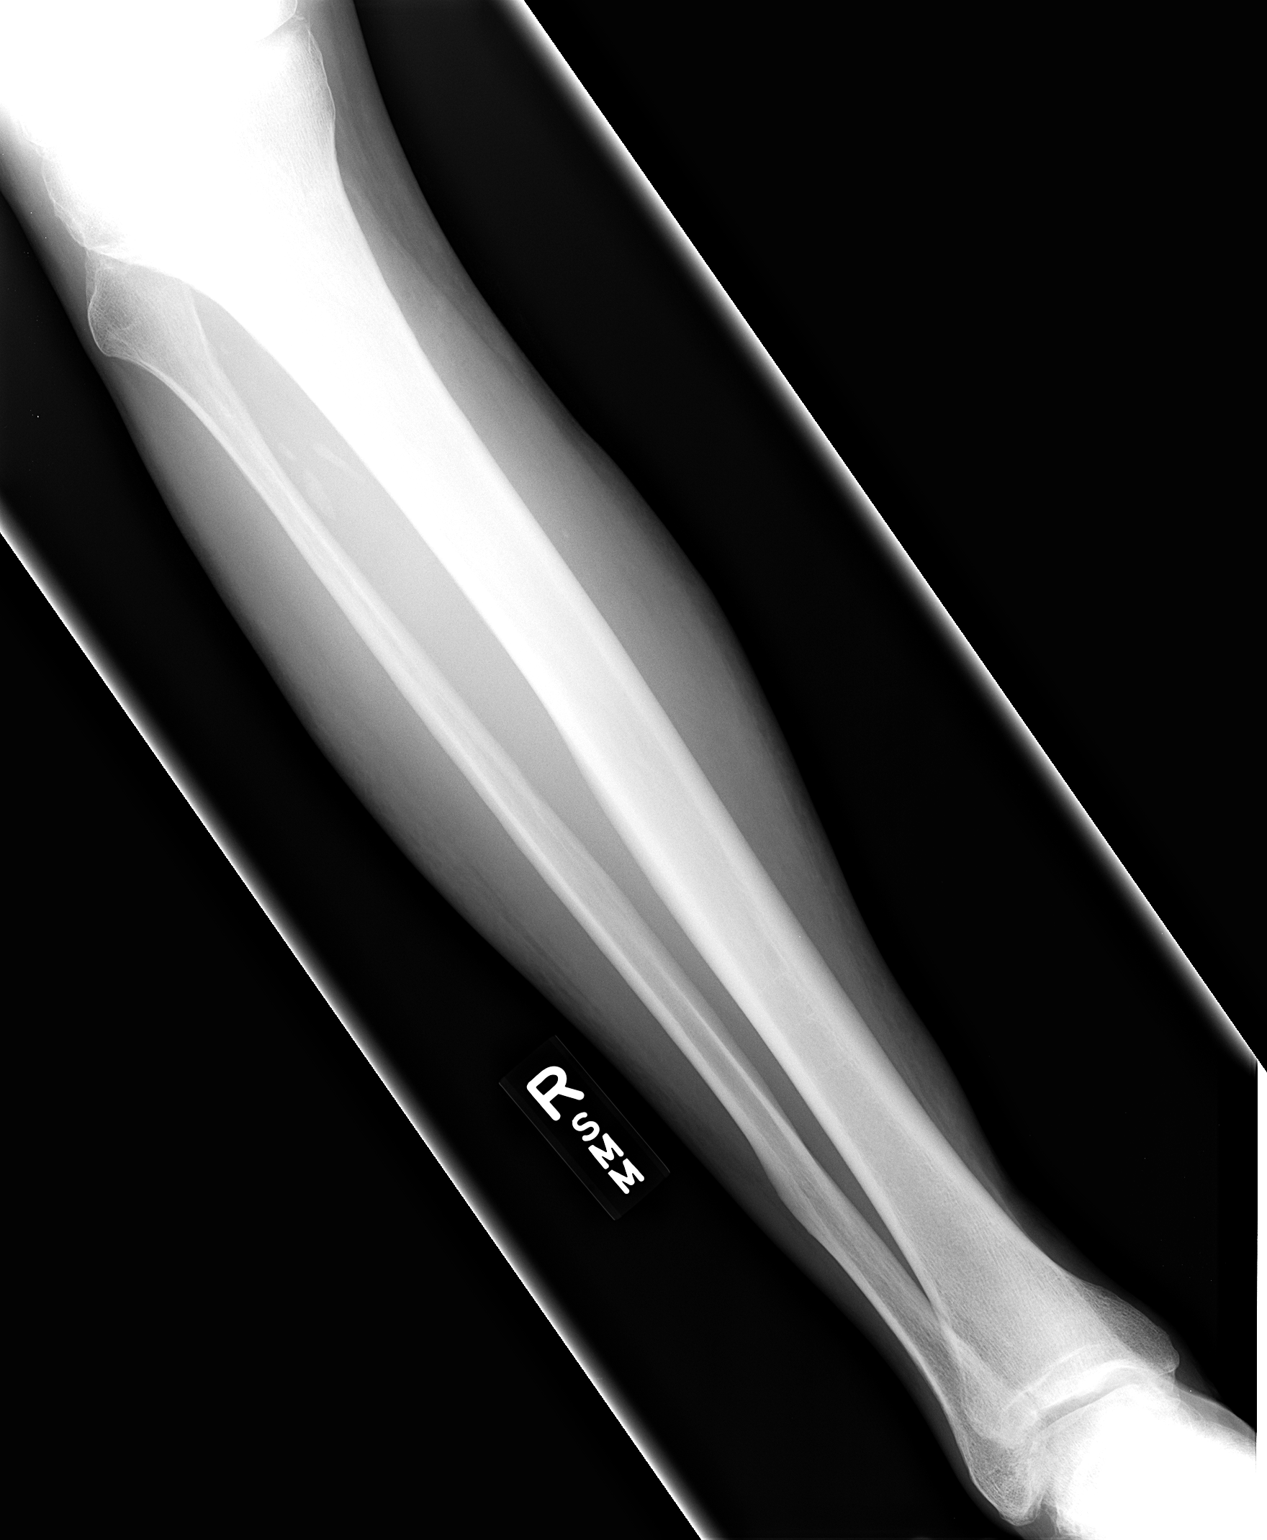

[view not recorded (2 of 3)]
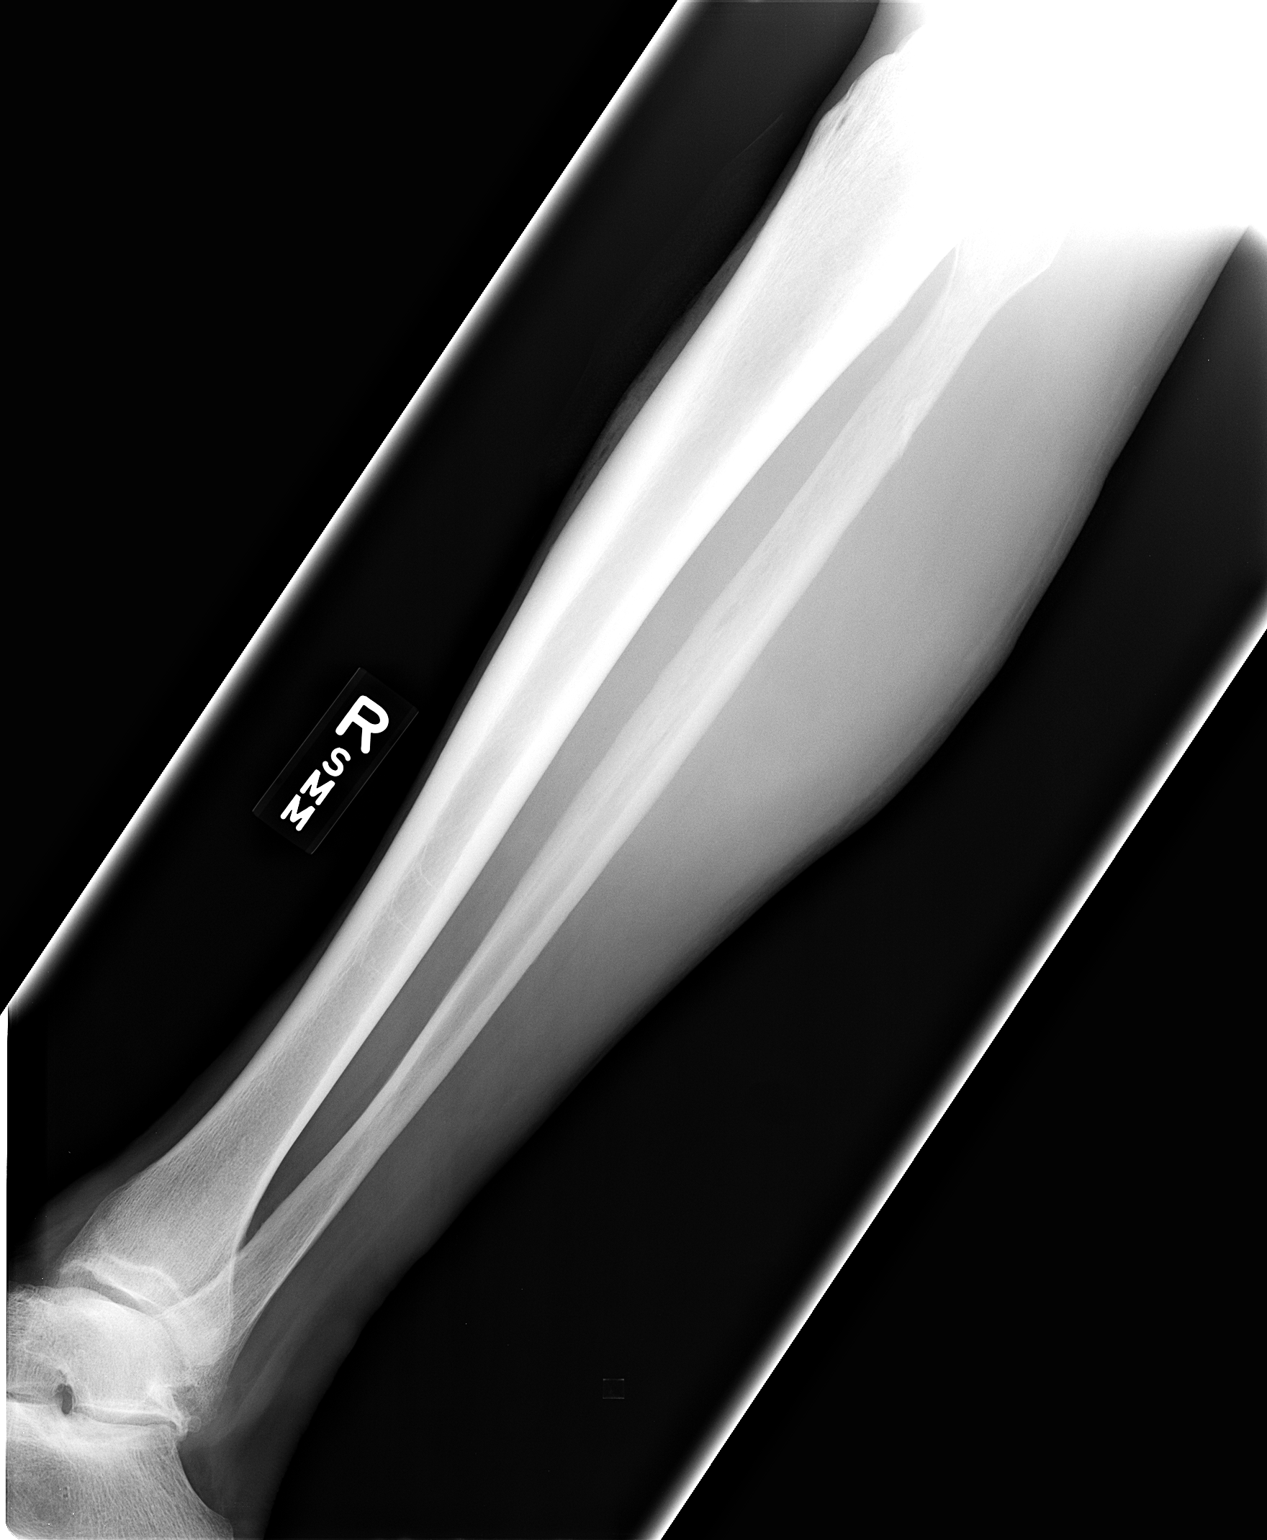

[view not recorded (3 of 3)]
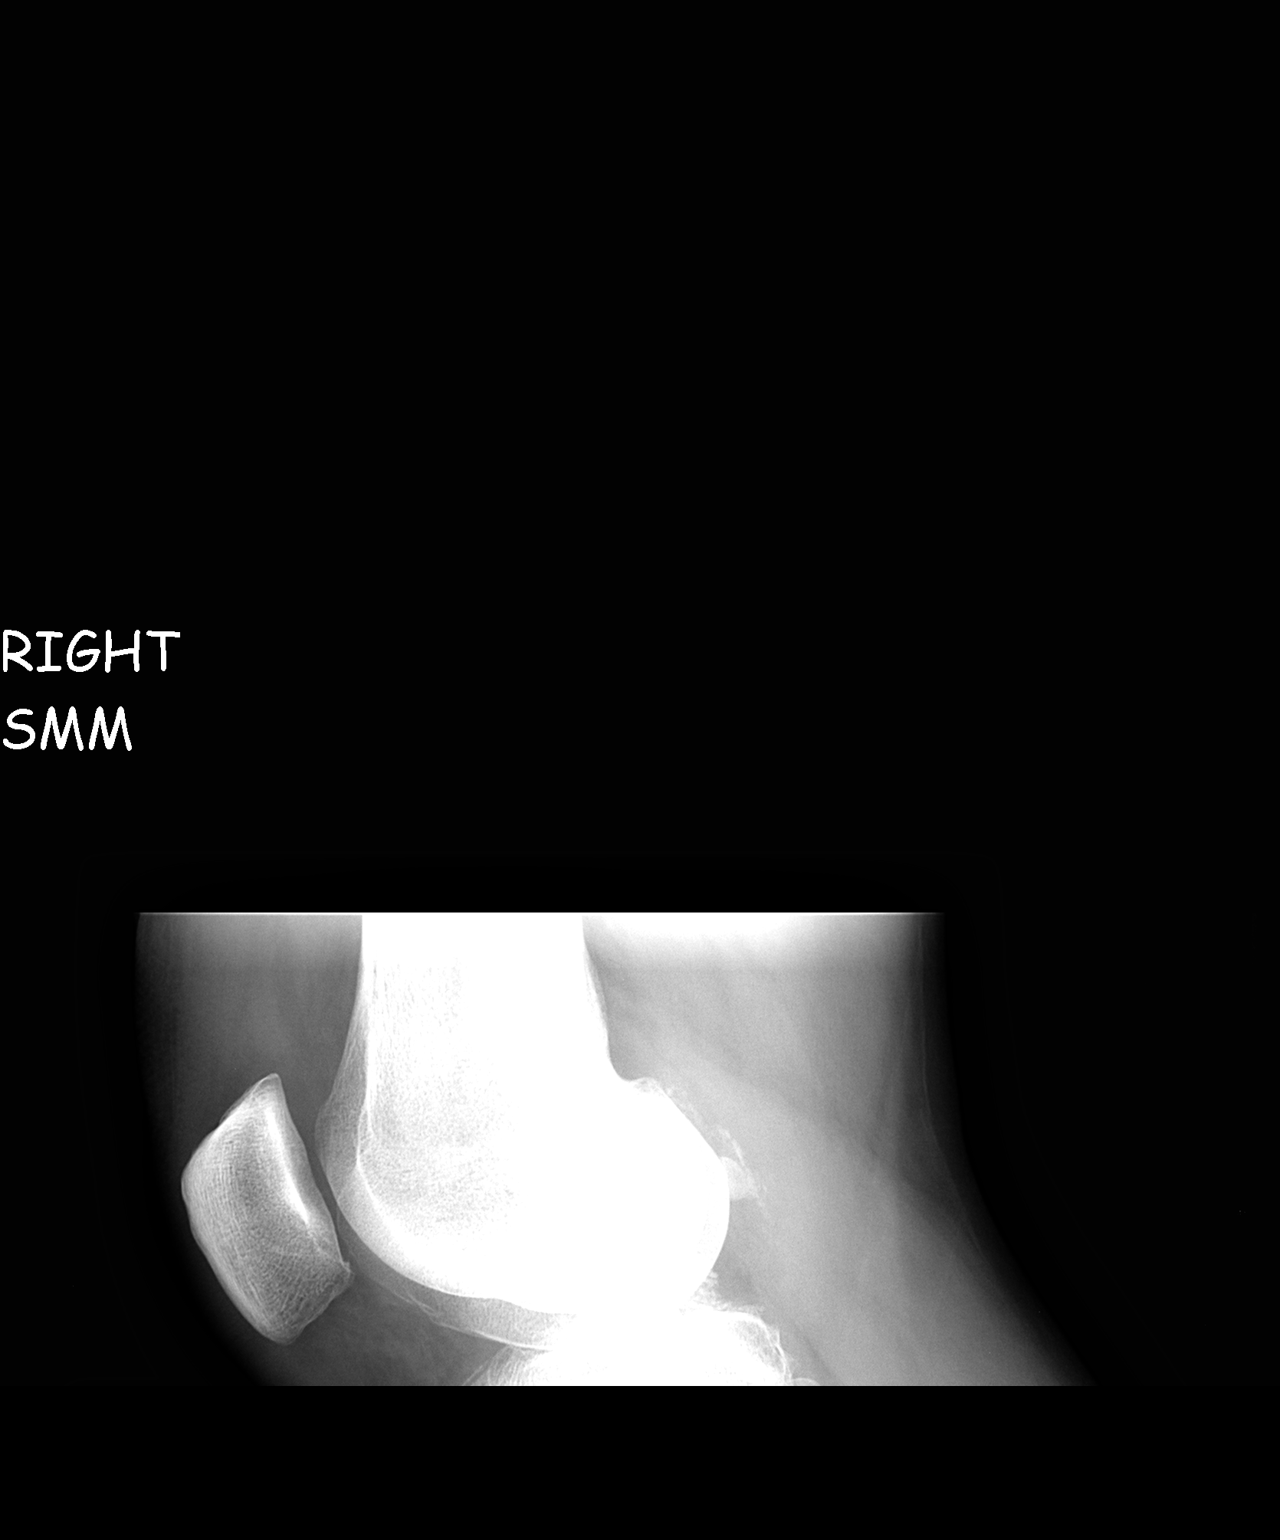

[3 of 3 positions shown; findings below may reference images not displayed]

FINDINGS: No acute fracture and no dislocation.  Vascular
calcifications are noted.
IMPRESSION: No acute bony pathology.

## 2014-02-22 DIAGNOSIS — E291 Testicular hypofunction: Secondary | ICD-10-CM | POA: Diagnosis not present

## 2014-02-22 DIAGNOSIS — N400 Enlarged prostate without lower urinary tract symptoms: Secondary | ICD-10-CM | POA: Diagnosis not present

## 2014-02-22 DIAGNOSIS — Z79899 Other long term (current) drug therapy: Secondary | ICD-10-CM | POA: Diagnosis not present

## 2014-02-22 DIAGNOSIS — R198 Other specified symptoms and signs involving the digestive system and abdomen: Secondary | ICD-10-CM | POA: Diagnosis not present

## 2014-02-22 DIAGNOSIS — Z1211 Encounter for screening for malignant neoplasm of colon: Secondary | ICD-10-CM | POA: Diagnosis not present

## 2014-03-08 DIAGNOSIS — Z1211 Encounter for screening for malignant neoplasm of colon: Secondary | ICD-10-CM | POA: Diagnosis not present

## 2014-04-12 DIAGNOSIS — Z23 Encounter for immunization: Secondary | ICD-10-CM | POA: Diagnosis not present

## 2014-04-30 ENCOUNTER — Encounter: Payer: Self-pay | Admitting: *Deleted

## 2014-05-31 DIAGNOSIS — D1801 Hemangioma of skin and subcutaneous tissue: Secondary | ICD-10-CM | POA: Diagnosis not present

## 2014-05-31 DIAGNOSIS — L821 Other seborrheic keratosis: Secondary | ICD-10-CM | POA: Diagnosis not present

## 2014-05-31 DIAGNOSIS — L814 Other melanin hyperpigmentation: Secondary | ICD-10-CM | POA: Diagnosis not present

## 2014-05-31 DIAGNOSIS — Z85828 Personal history of other malignant neoplasm of skin: Secondary | ICD-10-CM | POA: Diagnosis not present

## 2014-05-31 DIAGNOSIS — D239 Other benign neoplasm of skin, unspecified: Secondary | ICD-10-CM | POA: Diagnosis not present

## 2014-05-31 DIAGNOSIS — L570 Actinic keratosis: Secondary | ICD-10-CM | POA: Diagnosis not present

## 2014-05-31 DIAGNOSIS — I868 Varicose veins of other specified sites: Secondary | ICD-10-CM | POA: Diagnosis not present

## 2014-06-27 DIAGNOSIS — R51 Headache: Secondary | ICD-10-CM | POA: Diagnosis not present

## 2014-06-27 DIAGNOSIS — Z0000 Encounter for general adult medical examination without abnormal findings: Secondary | ICD-10-CM | POA: Diagnosis not present

## 2014-06-27 DIAGNOSIS — Z23 Encounter for immunization: Secondary | ICD-10-CM | POA: Diagnosis not present

## 2014-06-27 DIAGNOSIS — G479 Sleep disorder, unspecified: Secondary | ICD-10-CM | POA: Diagnosis not present

## 2014-06-27 DIAGNOSIS — G250 Essential tremor: Secondary | ICD-10-CM | POA: Diagnosis not present

## 2014-06-27 DIAGNOSIS — N529 Male erectile dysfunction, unspecified: Secondary | ICD-10-CM | POA: Diagnosis not present

## 2014-06-27 DIAGNOSIS — N400 Enlarged prostate without lower urinary tract symptoms: Secondary | ICD-10-CM | POA: Diagnosis not present

## 2014-06-27 DIAGNOSIS — E559 Vitamin D deficiency, unspecified: Secondary | ICD-10-CM | POA: Diagnosis not present

## 2014-06-27 DIAGNOSIS — E785 Hyperlipidemia, unspecified: Secondary | ICD-10-CM | POA: Diagnosis not present

## 2014-06-27 DIAGNOSIS — Z1389 Encounter for screening for other disorder: Secondary | ICD-10-CM | POA: Diagnosis not present

## 2014-07-21 DIAGNOSIS — E291 Testicular hypofunction: Secondary | ICD-10-CM | POA: Diagnosis not present

## 2014-07-21 DIAGNOSIS — N400 Enlarged prostate without lower urinary tract symptoms: Secondary | ICD-10-CM | POA: Diagnosis not present

## 2014-08-30 DIAGNOSIS — E291 Testicular hypofunction: Secondary | ICD-10-CM | POA: Diagnosis not present

## 2014-08-30 DIAGNOSIS — Z79899 Other long term (current) drug therapy: Secondary | ICD-10-CM | POA: Diagnosis not present

## 2014-09-08 DIAGNOSIS — N401 Enlarged prostate with lower urinary tract symptoms: Secondary | ICD-10-CM | POA: Diagnosis not present

## 2014-09-13 DIAGNOSIS — R3915 Urgency of urination: Secondary | ICD-10-CM | POA: Diagnosis not present

## 2014-09-13 DIAGNOSIS — N138 Other obstructive and reflux uropathy: Secondary | ICD-10-CM | POA: Diagnosis not present

## 2014-09-13 DIAGNOSIS — N5201 Erectile dysfunction due to arterial insufficiency: Secondary | ICD-10-CM | POA: Diagnosis not present

## 2014-09-13 DIAGNOSIS — N401 Enlarged prostate with lower urinary tract symptoms: Secondary | ICD-10-CM | POA: Diagnosis not present

## 2014-12-27 DIAGNOSIS — Z79899 Other long term (current) drug therapy: Secondary | ICD-10-CM | POA: Diagnosis not present

## 2014-12-27 DIAGNOSIS — E291 Testicular hypofunction: Secondary | ICD-10-CM | POA: Diagnosis not present

## 2015-01-02 ENCOUNTER — Other Ambulatory Visit: Payer: Self-pay

## 2015-01-03 DIAGNOSIS — Z79899 Other long term (current) drug therapy: Secondary | ICD-10-CM | POA: Diagnosis not present

## 2015-01-03 DIAGNOSIS — E291 Testicular hypofunction: Secondary | ICD-10-CM | POA: Diagnosis not present

## 2015-03-20 DIAGNOSIS — Z23 Encounter for immunization: Secondary | ICD-10-CM | POA: Diagnosis not present

## 2015-04-03 ENCOUNTER — Other Ambulatory Visit: Payer: Self-pay | Admitting: Family Medicine

## 2015-04-03 ENCOUNTER — Ambulatory Visit
Admission: RE | Admit: 2015-04-03 | Discharge: 2015-04-03 | Disposition: A | Discharge status: Home / Self Care | Payer: Medicare Other | Source: Ambulatory Visit | Attending: Family Medicine | Admitting: Family Medicine

## 2015-04-03 DIAGNOSIS — M545 Low back pain: Secondary | ICD-10-CM

## 2015-04-03 DIAGNOSIS — M5136 Other intervertebral disc degeneration, lumbar region: Secondary | ICD-10-CM | POA: Diagnosis not present

## 2015-04-19 DIAGNOSIS — N401 Enlarged prostate with lower urinary tract symptoms: Secondary | ICD-10-CM | POA: Diagnosis not present

## 2015-04-19 DIAGNOSIS — N138 Other obstructive and reflux uropathy: Secondary | ICD-10-CM | POA: Diagnosis not present

## 2015-04-19 DIAGNOSIS — R339 Retention of urine, unspecified: Secondary | ICD-10-CM | POA: Diagnosis not present

## 2015-04-19 DIAGNOSIS — N139 Obstructive and reflux uropathy, unspecified: Secondary | ICD-10-CM | POA: Diagnosis not present

## 2015-05-04 DIAGNOSIS — N139 Obstructive and reflux uropathy, unspecified: Secondary | ICD-10-CM | POA: Diagnosis not present

## 2015-05-04 DIAGNOSIS — R3915 Urgency of urination: Secondary | ICD-10-CM | POA: Diagnosis not present

## 2015-05-23 DIAGNOSIS — H52223 Regular astigmatism, bilateral: Secondary | ICD-10-CM | POA: Diagnosis not present

## 2015-05-23 DIAGNOSIS — H5203 Hypermetropia, bilateral: Secondary | ICD-10-CM | POA: Diagnosis not present

## 2015-05-23 DIAGNOSIS — H524 Presbyopia: Secondary | ICD-10-CM | POA: Diagnosis not present

## 2015-05-23 DIAGNOSIS — H25813 Combined forms of age-related cataract, bilateral: Secondary | ICD-10-CM | POA: Diagnosis not present

## 2015-05-23 DIAGNOSIS — H1851 Endothelial corneal dystrophy: Secondary | ICD-10-CM | POA: Diagnosis not present

## 2015-05-23 DIAGNOSIS — H0011 Chalazion right upper eyelid: Secondary | ICD-10-CM | POA: Diagnosis not present

## 2015-05-26 DIAGNOSIS — N401 Enlarged prostate with lower urinary tract symptoms: Secondary | ICD-10-CM | POA: Diagnosis not present

## 2015-05-26 DIAGNOSIS — N138 Other obstructive and reflux uropathy: Secondary | ICD-10-CM | POA: Diagnosis not present

## 2015-05-26 DIAGNOSIS — R8271 Bacteriuria: Secondary | ICD-10-CM | POA: Diagnosis not present

## 2015-05-26 DIAGNOSIS — R3915 Urgency of urination: Secondary | ICD-10-CM | POA: Diagnosis not present

## 2015-06-05 ENCOUNTER — Other Ambulatory Visit: Payer: Self-pay | Admitting: Urology

## 2015-06-07 DIAGNOSIS — D1800 Hemangioma unspecified site: Secondary | ICD-10-CM | POA: Diagnosis not present

## 2015-06-07 DIAGNOSIS — Z85828 Personal history of other malignant neoplasm of skin: Secondary | ICD-10-CM | POA: Diagnosis not present

## 2015-06-07 DIAGNOSIS — D485 Neoplasm of uncertain behavior of skin: Secondary | ICD-10-CM | POA: Diagnosis not present

## 2015-06-07 DIAGNOSIS — L821 Other seborrheic keratosis: Secondary | ICD-10-CM | POA: Diagnosis not present

## 2015-06-07 DIAGNOSIS — L570 Actinic keratosis: Secondary | ICD-10-CM | POA: Diagnosis not present

## 2015-06-07 DIAGNOSIS — L814 Other melanin hyperpigmentation: Secondary | ICD-10-CM | POA: Diagnosis not present

## 2015-06-08 ENCOUNTER — Encounter (HOSPITAL_BASED_OUTPATIENT_CLINIC_OR_DEPARTMENT_OTHER): Payer: Self-pay | Admitting: *Deleted

## 2015-06-08 DIAGNOSIS — C4441 Basal cell carcinoma of skin of scalp and neck: Secondary | ICD-10-CM | POA: Diagnosis not present

## 2015-06-08 NOTE — Progress Notes (Signed)
NPO AFTER MN.  ARRIVE AT 1000.  NEEDS HG.  

## 2015-06-12 NOTE — H&P (Signed)
Reason For Visit    History of Present Illness                             F/u --    1-BPH, lower urinary tract symptoms He has had some mild obstructive uropathy with occasional nocturia x1, He has a little bit of a slow stream and some start and stopping at times. He tried tamsulosin but it did not make much of a difference and he did not like the effect it had on his ejaculation. His urgency is bothersome at times.   -Jan 2012 PSA 2.32   -Feb 2013 took tamsulosin again for occasional hesitancy, frequency and urgency but stopped at due to inefficacy. PVR = 54 mL. PSA 2.22. He started daily Cialis.  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 Patient started sildenafil 20 mg in 2015.    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 Also an appendectomy in 1980.     He retired from [employer] in 2010         Nov 2016 interval hx   The patient returned to continue management of lower urinary tract symptoms and BPH. Tried Myrbetriq last time.     I reviewed October 2016 urodynamics- report and the tracing. Patient with episode of retention.  Findings: Lower capacity bladder,  275 mL, normal sensation. Mild instability. He voided with a pressure of 52-53 cm of water, Q max 8 cc/s for equivocal to obstructive flow pattern. He emptied to completion. There was some trabeculation and small diverticuli. EMG was quiet.    Consider again alpha blockers, finasteride or procedure depending on cystoscopic findings.     Estimated bacteria in the urine today and some mild dysuria. He has not been on antibiotics. He complains of a weak stream but also urgency and frequency.  He took tamsulosin for a while but has been off it for about a week. He is not sure he didn't notice much improvement on tamsulosin.    I recommended a urine culture and start antibiotics with follow-up for cystoscopy but they were adamant about going ahead with cystoscopy today although we did discuss the risk of serious infection.         Past Medical History Problems  1. History of Microscopic hematuria (R31.29) 2. History of No Medical Problems  Surgical History Problems  1. History of Appendectomy 2. History of Surgery Spermatic Cord Excision Of Hydrocele 3. History of Surgery Spermatic Cord Excision Of Varicocele 4. History of Surgery Testis 5. History of Surgery Vas Deferens Vasectomy  Current Meds 1. Advil TABS;  Therapy: (Recorded:18Nov2016) to Recorded 2. Aspirin Low Dose 81 MG TABS;  Therapy: (Recorded:29Jul2008) to Recorded 3. Butalbital-ASA-Caff-Codeine CAPS;  Therapy: (Recorded:27Jul2011) to Recorded 4. Sildenafil Citrate 20 MG Oral Tablet; take  2   -   5  tablets as needed and  instructed; Last Rx:08Mar2016 Ordered 5. Simvastatin 20 MG Oral Tablet;  Therapy: (Recorded:02Nov2009) to Recorded 6. Vitamin D3 CAPS;  Therapy: (Recorded:18Nov2016) to Recorded 7. Zolpidem Tartrate 10 MG Oral Tablet; 1  tab;HS;only as needed;  Therapy: [phone] to (Last Rx:27Jul2011) Ordered  Allergies Medication  1. No Known Drug Allergies  Family History Problems  1. Family history of Acute Myocardial Infarction : Brother 2. Family history of Death In The Family Father   53- pancreatic ca 3. Family history of Death In The Family Mother : Mother   41- heart failure & cancer 4. Family history of Diabetes Mellitus : Brother 5. Family history of Family Health Status Number Of Children : Mother   2 daughters 54. Family history of Lymphoma : Mother  Social History Problems  1. Alcohol Use (History)   1 per month 2. Caffeine Use   1-2 tea daily 3. Marital History  - Currently Married 4. Never A Smoker 5. 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 No acute distress.  Pulmonary: No respiratory distress and normal respiratory rhythm and effort.  Cardiovascular: Heart rate and rhythm are normal . No peripheral edema.  Neuro/Psych:. Mood and affect are appropriate.    Results/Data Urine [Data Includes: Last 1 Day]   [id number]  COLOR YELLOW   APPEARANCE CLOUDY   SPECIFIC GRAVITY 1.020   pH 5.5   GLUCOSE NEGATIVE   BILIRUBIN NEGATIVE   KETONE NEGATIVE   BLOOD 1+   PROTEIN NEGATIVE   NITRITE NEGATIVE   LEUKOCYTE ESTERASE 2+   SQUAMOUS EPITHELIAL/HPF 0-5 HPF  WBC >60 WBC/HPF  RBC 3-10 RBC/HPF  BACTERIA MODERATE HPF  CRYSTALS NONE SEEN HPF  CASTS NONE SEEN LPF  Yeast NONE SEEN HPF   Procedure  Procedure: Cystoscopy   Informed Consent: Risks, benefits, and potential adverse events were discussed and informed consent was obtained from the patient.  Prep: The patient was prepped with betadine.  Procedure Note:  Urethral meatus:. No abnormalities.  Anterior urethra: No abnormalities.  Prostatic urethra: No abnormalities . The lateral prostatic lobes were enlarged.  Bladder: Visulization was clear. The ureteral orifices were in the normal anatomic position bilaterally and had clear efflux of urine. A systematic survey of the bladder demonstrated no bladder tumors or stones. The mucosa was smooth without abnormalities. The patient tolerated the procedure well.  Complications: None.    Assessment Assessed  1. Benign prostatic hyperplasia with urinary obstruction (N40.1,N13.8) 2. Urinary urgency (R39.15) 3. Bacteriuria, asymptomatic (R82.71)  Plan Bacteriuria, asymptomatic  1. URINE CULTURE; Status:In Progress - Specimen/Data Collected;   Done:  [id number] Benign prostatic hyperplasia with urinary obstruction  2. Cysto; Status:Canceled - Appointment,Date of Service;  Health Maintenance  3. UA With REFLEX; [Do Not Release]; Status:Complete;   [id number] 12:41PM  Discussion/Summary      BPH with a urgency-discussed with patient and wife he has a mix of overactive bladder and obstructive symptoms with visual obstruction on cystoscopy. We went over the nature risks and benefits of tamsulosin, adding finasteride, overactive bladder medications and procedures such as ureteral left, greenlight or TURP. All questions answered. He is tried several different medications and would like something more definitive. The patient like to proceed with greenlight photo vaporization. I discussed risk of bleeding, infection, stricture, ED, RGE and incontinence among others. Discussed in most cases flow symptoms improve, urgency and frequency can also improve but sometimes urgency and frequency remain the same and even worsened. Also discussed need for multiple procedures. All questions answered and again they would like  to proceed. I covered him with his Cipro today and 2 for tomorrow. Urine culture pending.      Signatures Electronically signed by : [name], M.D.; May 26 2015  2:36PM EST  Add: Urine Cx negative.

## 2015-06-13 ENCOUNTER — Ambulatory Visit (HOSPITAL_BASED_OUTPATIENT_CLINIC_OR_DEPARTMENT_OTHER): Payer: Medicare Other | Admitting: Anesthesiology

## 2015-06-13 ENCOUNTER — Encounter (HOSPITAL_BASED_OUTPATIENT_CLINIC_OR_DEPARTMENT_OTHER): Payer: Self-pay | Admitting: *Deleted

## 2015-06-13 ENCOUNTER — Encounter (HOSPITAL_BASED_OUTPATIENT_CLINIC_OR_DEPARTMENT_OTHER)
Admission: RE | Disposition: A | Discharge status: Home / Self Care | Payer: Self-pay | Source: Ambulatory Visit | Attending: Urology

## 2015-06-13 ENCOUNTER — Ambulatory Visit (HOSPITAL_BASED_OUTPATIENT_CLINIC_OR_DEPARTMENT_OTHER)
Admission: RE | Admit: 2015-06-13 | Discharge: 2015-06-13 | Disposition: A | Discharge status: Home / Self Care | Payer: Medicare Other | Source: Ambulatory Visit | Attending: Urology | Admitting: Urology

## 2015-06-13 DIAGNOSIS — N401 Enlarged prostate with lower urinary tract symptoms: Secondary | ICD-10-CM | POA: Diagnosis not present

## 2015-06-13 DIAGNOSIS — N138 Other obstructive and reflux uropathy: Secondary | ICD-10-CM | POA: Insufficient documentation

## 2015-06-13 DIAGNOSIS — R3912 Poor urinary stream: Secondary | ICD-10-CM | POA: Diagnosis not present

## 2015-06-13 DIAGNOSIS — N320 Bladder-neck obstruction: Secondary | ICD-10-CM | POA: Diagnosis not present

## 2015-06-13 DIAGNOSIS — Z791 Long term (current) use of non-steroidal anti-inflammatories (NSAID): Secondary | ICD-10-CM | POA: Diagnosis not present

## 2015-06-13 DIAGNOSIS — K219 Gastro-esophageal reflux disease without esophagitis: Secondary | ICD-10-CM | POA: Insufficient documentation

## 2015-06-13 DIAGNOSIS — R350 Frequency of micturition: Secondary | ICD-10-CM | POA: Insufficient documentation

## 2015-06-13 DIAGNOSIS — Z7982 Long term (current) use of aspirin: Secondary | ICD-10-CM | POA: Diagnosis not present

## 2015-06-13 DIAGNOSIS — R3915 Urgency of urination: Secondary | ICD-10-CM | POA: Diagnosis not present

## 2015-06-13 DIAGNOSIS — Z79899 Other long term (current) drug therapy: Secondary | ICD-10-CM | POA: Diagnosis not present

## 2015-06-13 DIAGNOSIS — R351 Nocturia: Secondary | ICD-10-CM | POA: Diagnosis not present

## 2015-06-13 DIAGNOSIS — K449 Diaphragmatic hernia without obstruction or gangrene: Secondary | ICD-10-CM | POA: Insufficient documentation

## 2015-06-13 HISTORY — DX: Benign prostatic hyperplasia without lower urinary tract symptoms: N40.0

## 2015-06-13 HISTORY — DX: Presence of external hearing-aid: Z97.4

## 2015-06-13 HISTORY — PX: GREEN LIGHT LASER TURP (TRANSURETHRAL RESECTION OF PROSTATE: SHX6260

## 2015-06-13 HISTORY — DX: Vitamin D deficiency, unspecified: E55.9

## 2015-06-13 HISTORY — DX: Bladder-neck obstruction: N32.0

## 2015-06-13 HISTORY — DX: Unspecified glaucoma: H40.9

## 2015-06-13 HISTORY — DX: Hyperlipidemia, unspecified: E78.5

## 2015-06-13 HISTORY — DX: Unspecified symptoms and signs involving the genitourinary system: R39.9

## 2015-06-13 HISTORY — DX: Presence of spectacles and contact lenses: Z97.3

## 2015-06-13 LAB — POCT HEMOGLOBIN-HEMACUE: HEMOGLOBIN: 15.9 g/dL (ref 13.0–17.0)

## 2015-06-13 SURGERY — GREEN LIGHT LASER TURP (TRANSURETHRAL RESECTION OF PROSTATE
Anesthesia: General

## 2015-06-13 MED ORDER — CEFAZOLIN SODIUM-DEXTROSE 2-3 GM-% IV SOLR
2.0000 g | INTRAVENOUS | Status: AC
  Administered 2015-06-13: 2 g via INTRAVENOUS
  Filled 2015-06-13: qty 50

## 2015-06-13 MED ORDER — TAMSULOSIN HCL 0.4 MG PO CAPS: 0.4000 mg | ORAL_CAPSULE | Freq: Every day | ORAL | Status: DC

## 2015-06-13 MED ORDER — CEFAZOLIN SODIUM-DEXTROSE 2-3 GM-% IV SOLR
INTRAVENOUS | Status: AC
Start: 2015-06-13 — End: 2015-06-13
  Filled 2015-06-13: qty 50

## 2015-06-13 MED ORDER — EPHEDRINE SULFATE 50 MG/ML IJ SOLN
INTRAMUSCULAR | Status: AC
  Filled 2015-06-13: qty 1

## 2015-06-13 MED ORDER — FENTANYL CITRATE (PF) 100 MCG/2ML IJ SOLN
INTRAMUSCULAR | Status: DC | PRN
  Administered 2015-06-13 (×2): 50 ug via INTRAVENOUS

## 2015-06-13 MED ORDER — CEFAZOLIN SODIUM 1-5 GM-% IV SOLN
1.0000 g | INTRAVENOUS | Status: DC
  Filled 2015-06-13: qty 50

## 2015-06-13 MED ORDER — DEXAMETHASONE SODIUM PHOSPHATE 10 MG/ML IJ SOLN
INTRAMUSCULAR | Status: AC
  Filled 2015-06-13: qty 1

## 2015-06-13 MED ORDER — EPHEDRINE SULFATE 50 MG/ML IJ SOLN
INTRAMUSCULAR | Status: DC | PRN
  Administered 2015-06-13: 10 mg via INTRAVENOUS
  Administered 2015-06-13: 5 mg via INTRAVENOUS

## 2015-06-13 MED ORDER — ASPIRIN 81 MG PO TABS: 81.0000 mg | ORAL_TABLET | Freq: Every day | ORAL | Status: AC

## 2015-06-13 MED ORDER — MIDAZOLAM HCL 2 MG/2ML IJ SOLN
INTRAMUSCULAR | Status: AC
  Filled 2015-06-13: qty 2

## 2015-06-13 MED ORDER — FENTANYL CITRATE (PF) 250 MCG/5ML IJ SOLN
INTRAMUSCULAR | Status: AC
  Filled 2015-06-13: qty 5

## 2015-06-13 MED ORDER — SODIUM CHLORIDE 0.9 % IR SOLN
Status: DC | PRN
  Administered 2015-06-13: 15000 mL via INTRAVESICAL

## 2015-06-13 MED ORDER — MIDAZOLAM HCL 5 MG/5ML IJ SOLN
INTRAMUSCULAR | Status: DC | PRN
  Administered 2015-06-13: 2 mg via INTRAVENOUS

## 2015-06-13 MED ORDER — FENTANYL CITRATE (PF) 100 MCG/2ML IJ SOLN
25.0000 ug | INTRAMUSCULAR | Status: DC | PRN
  Administered 2015-06-13 (×2): 25 ug via INTRAVENOUS
  Filled 2015-06-13: qty 1

## 2015-06-13 MED ORDER — CEPHALEXIN 500 MG PO CAPS: 500.0000 mg | ORAL_CAPSULE | Freq: Every day | ORAL | Status: DC

## 2015-06-13 MED ORDER — PROPOFOL 10 MG/ML IV BOLUS
INTRAVENOUS | Status: DC | PRN
Start: 2015-06-13 — End: 2015-06-13
  Administered 2015-06-13: 150 mg via INTRAVENOUS

## 2015-06-13 MED ORDER — OXYCODONE-ACETAMINOPHEN 5-325 MG PO TABS
ORAL_TABLET | ORAL | Status: AC
  Filled 2015-06-13: qty 1

## 2015-06-13 MED ORDER — ONDANSETRON HCL 4 MG/2ML IJ SOLN
INTRAMUSCULAR | Status: AC
  Filled 2015-06-13: qty 2

## 2015-06-13 MED ORDER — OXYCODONE-ACETAMINOPHEN 5-325 MG PO TABS
1.0000 | ORAL_TABLET | Freq: Once | ORAL | Status: AC
  Administered 2015-06-13: 1 via ORAL
  Filled 2015-06-13: qty 1

## 2015-06-13 MED ORDER — DEXAMETHASONE SODIUM PHOSPHATE 4 MG/ML IJ SOLN
INTRAMUSCULAR | Status: DC | PRN
  Administered 2015-06-13 (×2): 10 mg via INTRAVENOUS

## 2015-06-13 MED ORDER — PROPOFOL 10 MG/ML IV BOLUS
INTRAVENOUS | Status: AC
  Filled 2015-06-13: qty 20

## 2015-06-13 MED ORDER — LIDOCAINE HCL (CARDIAC) 20 MG/ML IV SOLN
INTRAVENOUS | Status: AC
  Filled 2015-06-13: qty 5

## 2015-06-13 MED ORDER — FENTANYL CITRATE (PF) 100 MCG/2ML IJ SOLN
INTRAMUSCULAR | Status: AC
  Filled 2015-06-13: qty 2

## 2015-06-13 MED ORDER — OXYCODONE-ACETAMINOPHEN 5-325 MG PO TABS: 1.0000 | ORAL_TABLET | ORAL | Status: DC | PRN

## 2015-06-13 MED ORDER — LACTATED RINGERS IV SOLN
INTRAVENOUS | Status: DC
  Administered 2015-06-13 (×2): via INTRAVENOUS
  Filled 2015-06-13: qty 1000

## 2015-06-13 MED ORDER — MEPERIDINE HCL 25 MG/ML IJ SOLN
6.2500 mg | INTRAMUSCULAR | Status: DC | PRN
  Filled 2015-06-13: qty 1

## 2015-06-13 MED ORDER — PROMETHAZINE HCL 25 MG/ML IJ SOLN
6.2500 mg | INTRAMUSCULAR | Status: DC | PRN
  Filled 2015-06-13: qty 1

## 2015-06-13 MED ORDER — LIDOCAINE HCL (CARDIAC) 20 MG/ML IV SOLN
INTRAVENOUS | Status: DC | PRN
  Administered 2015-06-13: 60 mg via INTRAVENOUS

## 2015-06-13 MED ORDER — CEFAZOLIN SODIUM-DEXTROSE 2-3 GM-% IV SOLR
INTRAVENOUS | Status: AC
  Filled 2015-06-13: qty 50

## 2015-06-13 MED ORDER — ONDANSETRON HCL 4 MG/2ML IJ SOLN
INTRAMUSCULAR | Status: DC | PRN
  Administered 2015-06-13: 4 mg via INTRAVENOUS

## 2015-06-13 SURGICAL SUPPLY — 27 items
BAG URINE DRAINAGE (UROLOGICAL SUPPLIES) ×2 IMPLANT
BAG URO CATCHER STRL LF (DRAPE) ×2 IMPLANT
CATH COUDE FOLEY 2W 5CC 18FR (CATHETERS) ×1 IMPLANT
CATH FOLEY 2WAY SLVR  5CC 18FR (CATHETERS)
CATH FOLEY 2WAY SLVR 5CC 18FR (CATHETERS) IMPLANT
CLOTH BEACON ORANGE TIMEOUT ST (SAFETY) ×2 IMPLANT
ELECT BIVAP BIPO 22/24 DONUT (ELECTROSURGICAL)
ELECT LOOP MED HF 24F 12D (CUTTING LOOP) IMPLANT
ELECTRD BIVAP BIPO 22/24 DONUT (ELECTROSURGICAL) IMPLANT
FEE RENTAL LASER GREENLIGHT (Laser) ×1 IMPLANT
GLOVE BIO SURGEON STRL SZ7.5 (GLOVE) ×4 IMPLANT
GOWN STRL REUS W/ TWL XL LVL3 (GOWN DISPOSABLE) ×1 IMPLANT
GOWN STRL REUS W/TWL XL LVL3 (GOWN DISPOSABLE) ×4
HOLDER FOLEY CATH W/STRAP (MISCELLANEOUS) ×1 IMPLANT
IV NS 1000ML (IV SOLUTION) ×2
IV NS 1000ML BAXH (IV SOLUTION) ×1 IMPLANT
IV NS IRRIG 3000ML ARTHROMATIC (IV SOLUTION) ×6 IMPLANT
IV SET EXTENSION GRAVITY 40 LF (IV SETS) ×2 IMPLANT
KIT ROOM TURNOVER WOR (KITS) ×2 IMPLANT
LASER FIBER /GREENLIGHT LASER (Laser) ×2 IMPLANT
LASER GREENLIGHT RENTAL P/PROC (Laser) ×2 IMPLANT
LOOP CUT BIPOLAR 24F LRG (ELECTROSURGICAL) IMPLANT
MANIFOLD NEPTUNE II (INSTRUMENTS) ×1 IMPLANT
PACK CYSTO (CUSTOM PROCEDURE TRAY) ×2 IMPLANT
SYR 30ML LL (SYRINGE) IMPLANT
SYRINGE IRR TOOMEY STRL 70CC (SYRINGE) ×1 IMPLANT
TUBE CONNECTING 12X1/4 (SUCTIONS) ×1 IMPLANT

## 2015-06-13 NOTE — Interval H&P Note (Signed)
History and Physical Interval Note:  06/13/2015 11:48 AM  Nicholas Mahoney  has presented today for surgery, with the diagnosis of BENIGN PROSTATIC HYPERPLASIA WITH BLADDER OUTLET OBSTRUCTION  The various methods of treatment have been discussed with the patient and family. After consideration of risks, benefits and other options for treatment, the patient has consented to  Procedure(s): GREEN LIGHT LASER TURP (TRANSURETHRAL RESECTION OF PROSTATE (N/A) as a surgical intervention .  The patient's history has been reviewed, patient examined, no change in status, stable for surgery.  I have reviewed the patient's chart and labs.  Questions were answered to the patient's satisfaction.  He's noted some gross hematuria since to cystoscopy. He asked if we would be doing a "biopsy of the prostate". I told him no, we would not be getting prostate tissue and also a greenlight or TURP does not "biopsy" the peripheral zone like a traditional transrectal ultrasound biopsy. We discussed the difference between the peripheral zone and transition zone and the need for continued follow-up for prostate cancer even after these procedures. I reviewed given his prior normal PSA and normal DRE the suspicion for prostate cancer would be low. Patient has had no dysuria or fever. He elects to proceed.   Katrinia Straker

## 2015-06-13 NOTE — Op Note (Signed)
Preoperative diagnosis: BPH Postoperative diagnosis: BPH  Procedure: Exam under anesthesia, greenlight photo vaporization of the prostate  Surgeon: Junious Silk  Anesthesia: General  Indication for procedure: 69 year old with history of BPH and voiding symptoms such as weak stream.  He had a history of gross hematuria and urinary tract infection we discussed management options and the nature risk and benefits of greenlight photo vaporization of the prostate and he elected to proceed.  Findings: On exam under anesthesia the penis was circumcised and without mass or lesion.  The testicles were descended bilaterally and palpably normal.  On digital rectal exam the prostate was enlarged to about 50 g without hard area or nodule.  All landmarks preserved.    On cystoscopy, The urethra was normal, the prostatic urethra was slightly elongated and there was lateral lobe hypertrophy with visual obstruction.  There was no median lobe.  The trigone and ureteral orifice easily in the normal orthotopic position and exhibited clear efflux.  The bladder appeared normal without mass or lesion.  There was no stone or foreign body in the bladder.  The mucosa appeared normal.  Description of procedure: After consent was obtained the patient was brought to the operating room.  After adequate anesthesia he was placed in lithotomy position and prepped and draped in the usual sterile fashion.  A timeout was performed to confirm the patient and procedure.  An exam under anesthesia was performed.  The cystoscope was passed per urethra and the bladder inspected.  The laser fiber was then passed and given the patient's anatomy I opted to make an incision at 6:00 down to the bladder neck and prostatic capsule to develop the depth.  This was brought down in an inverted Y-shaped in the usual hockey-stick incisions at the apex.  Because there was no median lobe but did not vaporize posteriorly.  The lateral lobes were then vaporized  from anterior to posterior bladder neck down to veru and back as needed.  This created an excellent channel.  There was good hemostasis at low pressure.  The bladder was refilled and the ureteral orifice season bladder again inspected and noted to be without injury.  The scope was removed and an 79 [place] coude Catheter was advanced without difficulty.  The balloon was inflated and seated at the bladder neck.  Drainage was light red and I held some gentle traction for a couple of minutes and the irrigation returned clear.  The patient was then placed supine and awakened and taken to the recovery room in stable condition.  Complications: None Blood loss: Minimal Specimens: None  Drains: 80 French coude catheter

## 2015-06-13 NOTE — Discharge Instructions (Addendum)
Foley Catheter Care, Adult A Foley catheter is a soft, flexible tube. This tube is placed into your bladder to drain pee (urine). If you go home with this catheter in place, follow the instructions below. TAKING CARE OF THE CATHETER  Wash your hands with soap and water.  Put soap and water on a clean washcloth.  Clean the skin where the tube goes into your body.  Clean away from the tube site.  Never wipe toward the tube.  Clean the area using a circular motion.  Remove all the soap. Pat the area dry with a clean towel. For males, reposition the skin that covers the end of the penis (foreskin).  Attach the tube to your leg with tape or a leg strap. Do not stretch the tube tight. If you are using tape, remove any stickiness left behind by past tape you used.  Keep the drainage bag below your hips. Keep it off the floor.  Check your tube during the day. Make sure it is working and draining. Make sure the tube does not curl, twist, or bend.  Do not pull on the tube or try to take it out. TAKING CARE OF THE DRAINAGE BAGS You will have a large overnight drainage bag and a small leg bag. You may wear the overnight bag any time. Never wear the small bag at night. Follow the directions below. Emptying the Drainage Bag Empty your drainage bag when it is  - full or at least 2-3 times a day.  Wash your hands with soap and water.  Keep the drainage bag below your hips.  Hold the dirty bag over the toilet or clean container.  Open the pour spout at the bottom of the bag. Empty the pee into the toilet or container. Do not let the pour spout touch anything.  Clean the pour spout with a gauze pad or cotton ball that has rubbing alcohol on it.  Close the pour spout.  Attach the bag to your leg with tape or a leg strap.  Wash your hands well. Changing the Drainage Bag Change your bag once a month or sooner if it starts to smell or look dirty.   Wash your hands with soap and  water.  Pinch the rubber tube so that pee does not spill out.  Disconnect the catheter tube from the drainage tube at the connection valve. Do not let the tubes touch anything.  Clean the end of the catheter tube with an alcohol wipe. Clean the end of a the drainage tube with a different alcohol wipe.  Connect the catheter tube to the drainage tube of the clean drainage bag.  Attach the new bag to the leg with tape or a leg strap. Avoid attaching the new bag too tightly.  Wash your hands well. Cleaning the Drainage Bag  Wash your hands with soap and water.  Wash the bag in warm, soapy water.  Rinse the bag with warm water.  Fill the bag with a mixture of white vinegar and water (1 cup vinegar to 1 quart warm water [.2 liter vinegar to 1 liter warm water]). Close the bag and soak it for 30 minutes in the solution.  Rinse the bag with warm water.  Hang the bag to dry with the pour spout open and hanging downward.  Store the clean bag (once it is dry) in a clean plastic bag.  Wash your hands well. PREVENT INFECTION  Wash your hands before and after touching your tube.  Take showers every day. Wash the skin where the tube enters your body. Do not take baths. Replace wet leg straps with dry ones, if this applies.  Do not use powders, sprays, or lotions on the genital area. Only use creams, lotions, or ointments as told by your doctor.  For females, wipe from front to back after going to the bathroom.  Drink enough fluids to keep your pee clear or pale yellow unless you are told not to have too much fluid (fluid restriction).  Do not let the drainage bag or tubing touch or lie on the floor.  Wear cotton underwear to keep the area dry. GET HELP IF:  Your pee is cloudy or smells unusually bad.  Your tube becomes clogged.  You are not draining pee into the bag or your bladder feels full.  Your tube starts to leak. GET HELP RIGHT AWAY IF:  You have pain, puffiness  (swelling), redness, or yellowish-white fluid (pus) where the tube enters the body.  You have pain in the belly (abdomen), legs, lower back, or bladder.  You have a fever.  You see blood fill the tube, or your pee is pink or red.  You feel sick to your stomach (nauseous), throw up (vomit), or have chills.  Your tube gets pulled out. MAKE SURE YOU:   Understand these instructions.  Will watch your condition.  Will get help right away if you are not doing well or get worse.   This information is not intended to replace advice given to you by your health care provider. Make sure you discuss any questions you have with your health care provider.   Document Released: 10/19/2012 Document Revised: 07/15/2014 Document Reviewed: 10/19/2012 Elsevier Interactive Patient Education 2016 [place] Instructions  Activity: Get plenty of rest for the remainder of the day. A responsible adult should stay with you for 24 hours following the procedure.  For the next 24 hours, DO NOT: -Drive a car -Paediatric nurse -Drink alcoholic beverages -Take any medication unless instructed by your physician -Make any legal decisions or sign important papers.  Meals: Start with liquid foods such as gelatin or soup. Progress to regular foods as tolerated. Avoid greasy, spicy, heavy foods. If nausea and/or vomiting occur, drink only clear liquids until the nausea and/or vomiting subsides. Call your physician if vomiting continues.  Special Instructions/Symptoms: Your throat may feel dry or sore from the anesthesia or the breathing tube placed in your throat during surgery. If this causes discomfort, gargle with warm salt water. The discomfort should disappear within 24 hours.  If you had a scopolamine patch placed behind your ear for the management of post- operative nausea and/or vomiting:  1. The medication in the patch is effective for 72 hours, after which it should be  removed.  Wrap patch in a tissue and discard in the trash. Wash hands thoroughly with soap and water. 2. You may remove the patch earlier than 72 hours if you experience unpleasant side effects which may include dry mouth, dizziness or visual disturbances. 3. Avoid touching the patch. Wash your hands with soap and water after contact with the patch.   Transurethral Resection of the Prostate, Care After Refer to this sheet in the next few weeks. These instructions provide you with information on caring for yourself after your procedure. Your caregiver also may give you specific instructions. Your treatment has been planned according to current medical practices, but complications sometimes occur. Call your caregiver  if you have any problems or questions after your procedure. HOME CARE INSTRUCTIONS  Recovery can take 4-6 weeks. Avoid alcohol, caffeinated drinks, and spicy foods for 2 weeks after your procedure. Drink enough fluids to keep your urine clear or pale yellow. Urinate as soon as you feel the urge to do so. Do not try to hold your urine for long periods of time. During recovery you may experience pain caused by bladder spasms, which result in a very intense urge to urinate. Take all medicines as directed by your caregiver, including medicines for pain. Try to limit the amount of pain medicines you take because it can cause constipation. If you do become constipated, do not strain to move your bowels. Straining can increase bleeding. Constipation can be minimized by increasing the amount fluids and fiber in your diet. Your caregiver also may prescribe a stool softener. Do not lift heavy objects (more than 5 lb [2.25 kg]) or perform exercises that cause you to strain for at least 1 month after your procedure. When sitting, you may want to sit in a soft chair or use a cushion. For the first 10 days after your procedure, avoid the following activities:  Running.  Strenuous work.  Long  walks.  Riding in a car for extended periods.  Sex. SEEK MEDICAL CARE IF:  You have difficulty urinating.  You have blood in your urine that does not go away after you rest or increase your fluid intake.  You have swelling in your penis or scrotum. SEEK IMMEDIATE MEDICAL CARE IF:   You are suddenly unable to urinate.  You notice blood clots in your urine.  You have chills.  You have a fever.  You have pain in your back or lower abdomen.  You have pain or swelling in your legs. MAKE SURE YOU:   Understand these instructions.  Will watch your condition.  Will get help right away if you are not doing well or get worse.   This information is not intended to replace advice given to you by your health care provider. Make sure you discuss any questions you have with your health care provider.   Document Released: 06/24/2005 Document Revised: 07/15/2014 Document Reviewed: 08/02/2011 Elsevier Interactive Patient Education [employer].

## 2015-06-13 NOTE — Brief Op Note (Signed)
06/13/2015  1:14 PM  PATIENT:  Nicholas Mahoney  69 y.o. male  PRE-OPERATIVE DIAGNOSIS:  BENIGN PROSTATIC HYPERPLASIA WITH BLADDER OUTLET OBSTRUCTION  POST-OPERATIVE DIAGNOSIS:  BENIGN PROSTATIC HYPERPLASIA WITH BLADDER OUTLET OBSTRUCTION  PROCEDURE:  Procedure(s): GREEN LIGHT LASER TURP (TRANSURETHRAL RESECTION OF PROSTATE (N/A) EUA - normal GU, nl DRE   SURGEON:  Surgeon(s) and Role:    * [name], MD - Primary   ANESTHESIA:   general  EBL:  Total I/O In: 1000 [I.V.:1000] Out: - minimal  DRAINS: 18 fr coude foley    SPECIMEN:  none    PATIENT DISPOSITION:  PACU - hemodynamically stable.   Plan d/c home

## 2015-06-13 NOTE — Transfer of Care (Signed)
Immediate Anesthesia Transfer of Care Note  Patient: Nicholas Mahoney  Procedure(s) Performed: Procedure(s) (LRB): GREEN LIGHT LASER TURP (TRANSURETHRAL RESECTION OF PROSTATE (N/A)  Patient Location: PACU  Anesthesia Type: General  Level of Consciousness: awake, oriented, sedated and patient cooperative  Airway & Oxygen Therapy: Patient Spontanous Breathing and Patient connected to face mask oxygen  Post-op Assessment: Report given to PACU RN and Post -op Vital signs reviewed and stable  Post vital signs: Reviewed and stable  Complications: No apparent anesthesia complications

## 2015-06-13 NOTE — Anesthesia Preprocedure Evaluation (Addendum)
Anesthesia Evaluation  Patient identified by MRN, date of birth, ID band Patient awake    Reviewed: Allergy & Precautions, NPO status , Patient's Chart, lab work & pertinent test results  Airway Mallampati: II  TM Distance: >3 FB Neck ROM: Full    Dental no notable dental hx. (+) Teeth Intact, Dental Advisory Given   Pulmonary neg pulmonary ROS,    Pulmonary exam normal breath sounds clear to auscultation       Cardiovascular Exercise Tolerance: Good negative cardio ROS Normal cardiovascular exam Rhythm:Regular Rate:Normal  Clean cardiac cath 2009   Neuro/Psych negative neurological ROS  negative psych ROS   GI/Hepatic negative GI ROS, Neg liver ROS, hiatal hernia, neg GERD  ,  Endo/Other  negative endocrine ROS  Renal/GU negative Renal ROS  negative genitourinary   Musculoskeletal negative musculoskeletal ROS (+)   Abdominal   Peds negative pediatric ROS (+)  Hematology negative hematology ROS (+)   Anesthesia Other Findings   Reproductive/Obstetrics negative OB ROS                            Anesthesia Physical Anesthesia Plan  ASA: II  Anesthesia Plan: General   Post-op Pain Management:    Induction: Intravenous  Airway Management Planned: LMA  Additional Equipment:   Intra-op Plan:   Post-operative Plan: Extubation in OR  Informed Consent: I have reviewed the patients History and Physical, chart, labs and discussed the procedure including the risks, benefits and alternatives for the proposed anesthesia with the patient or authorized representative who has indicated his/her understanding and acceptance.   Dental advisory given  Plan Discussed with: CRNA  Anesthesia Plan Comments:         Anesthesia Quick Evaluation

## 2015-06-13 NOTE — Anesthesia Procedure Notes (Signed)
Procedure Name: LMA Insertion Date/Time: 06/13/2015 12:18 PM Performed by: Wanita Chamberlain Pre-anesthesia Checklist: Patient identified, Timeout performed, Emergency Drugs available, Suction available and Patient being monitored Patient Re-evaluated:Patient Re-evaluated prior to inductionOxygen Delivery Method: Circle system utilized Preoxygenation: Pre-oxygenation with 100% oxygen Intubation Type: IV induction Ventilation: Mask ventilation without difficulty LMA: LMA inserted LMA Size: 4.0 Number of attempts: 1 Airway Equipment and Method: Bite block Placement Confirmation: breath sounds checked- equal and bilateral and positive ETCO2 Tube secured with: Tape Dental Injury: Teeth and Oropharynx as per pre-operative assessment

## 2015-06-14 ENCOUNTER — Encounter (HOSPITAL_BASED_OUTPATIENT_CLINIC_OR_DEPARTMENT_OTHER): Payer: Self-pay | Admitting: Urology

## 2015-06-14 NOTE — Anesthesia Postprocedure Evaluation (Signed)
Anesthesia Post Note  Patient: Nicholas Mahoney  Procedure(s) Performed: Procedure(s) (LRB): GREEN LIGHT LASER TURP (TRANSURETHRAL RESECTION OF PROSTATE (N/A)  Patient location during evaluation: PACU Anesthesia Type: General Level of consciousness: awake and alert Pain management: pain level controlled Vital Signs Assessment: post-procedure vital signs reviewed and stable Respiratory status: spontaneous breathing, nonlabored ventilation and respiratory function stable Cardiovascular status: blood pressure returned to baseline and stable Postop Assessment: no signs of nausea or vomiting Anesthetic complications: no    Last Vitals:  Filed Vitals:   06/13/15 1415 06/13/15 1515  BP: 142/76 133/65  Pulse: 67 67  Temp:  36 C  Resp: 10 14    Last Pain:  Filed Vitals:   06/13/15 1526  PainSc: 4                  Montez Hageman

## 2015-06-23 DIAGNOSIS — C4441 Basal cell carcinoma of skin of scalp and neck: Secondary | ICD-10-CM | POA: Diagnosis not present

## 2015-06-26 DIAGNOSIS — C4441 Basal cell carcinoma of skin of scalp and neck: Secondary | ICD-10-CM | POA: Diagnosis not present

## 2015-07-14 DIAGNOSIS — R197 Diarrhea, unspecified: Secondary | ICD-10-CM | POA: Diagnosis not present

## 2015-07-14 DIAGNOSIS — M7061 Trochanteric bursitis, right hip: Secondary | ICD-10-CM | POA: Diagnosis not present

## 2015-07-14 DIAGNOSIS — G250 Essential tremor: Secondary | ICD-10-CM | POA: Diagnosis not present

## 2015-07-14 DIAGNOSIS — G479 Sleep disorder, unspecified: Secondary | ICD-10-CM | POA: Diagnosis not present

## 2015-07-14 DIAGNOSIS — E785 Hyperlipidemia, unspecified: Secondary | ICD-10-CM | POA: Diagnosis not present

## 2015-07-14 DIAGNOSIS — N400 Enlarged prostate without lower urinary tract symptoms: Secondary | ICD-10-CM | POA: Diagnosis not present

## 2015-07-14 DIAGNOSIS — E559 Vitamin D deficiency, unspecified: Secondary | ICD-10-CM | POA: Diagnosis not present

## 2015-07-14 DIAGNOSIS — C4491 Basal cell carcinoma of skin, unspecified: Secondary | ICD-10-CM | POA: Diagnosis not present

## 2015-07-14 DIAGNOSIS — Z1389 Encounter for screening for other disorder: Secondary | ICD-10-CM | POA: Diagnosis not present

## 2015-07-14 DIAGNOSIS — N529 Male erectile dysfunction, unspecified: Secondary | ICD-10-CM | POA: Diagnosis not present

## 2015-07-14 DIAGNOSIS — Z0000 Encounter for general adult medical examination without abnormal findings: Secondary | ICD-10-CM | POA: Diagnosis not present

## 2015-07-24 DIAGNOSIS — R143 Flatulence: Secondary | ICD-10-CM | POA: Diagnosis not present

## 2015-07-24 DIAGNOSIS — Z1211 Encounter for screening for malignant neoplasm of colon: Secondary | ICD-10-CM | POA: Diagnosis not present

## 2015-07-24 DIAGNOSIS — R194 Change in bowel habit: Secondary | ICD-10-CM | POA: Diagnosis not present

## 2015-07-31 DIAGNOSIS — N401 Enlarged prostate with lower urinary tract symptoms: Secondary | ICD-10-CM | POA: Diagnosis not present

## 2015-07-31 DIAGNOSIS — Z0000 Encounter for general adult medical examination without abnormal findings: Secondary | ICD-10-CM | POA: Diagnosis not present

## 2015-07-31 DIAGNOSIS — N138 Other obstructive and reflux uropathy: Secondary | ICD-10-CM | POA: Diagnosis not present

## 2015-08-22 DIAGNOSIS — Z6822 Body mass index (BMI) 22.0-22.9, adult: Secondary | ICD-10-CM | POA: Diagnosis not present

## 2015-08-22 DIAGNOSIS — E291 Testicular hypofunction: Secondary | ICD-10-CM | POA: Diagnosis not present

## 2015-08-22 DIAGNOSIS — N400 Enlarged prostate without lower urinary tract symptoms: Secondary | ICD-10-CM | POA: Diagnosis not present

## 2015-08-22 DIAGNOSIS — Z79899 Other long term (current) drug therapy: Secondary | ICD-10-CM | POA: Diagnosis not present

## 2015-09-18 DIAGNOSIS — N401 Enlarged prostate with lower urinary tract symptoms: Secondary | ICD-10-CM | POA: Diagnosis not present

## 2015-09-20 ENCOUNTER — Other Ambulatory Visit: Payer: Self-pay | Admitting: Gastroenterology

## 2015-09-20 DIAGNOSIS — K640 First degree hemorrhoids: Secondary | ICD-10-CM | POA: Diagnosis not present

## 2015-09-20 DIAGNOSIS — K921 Melena: Secondary | ICD-10-CM | POA: Diagnosis not present

## 2015-09-20 DIAGNOSIS — K5730 Diverticulosis of large intestine without perforation or abscess without bleeding: Secondary | ICD-10-CM | POA: Diagnosis not present

## 2015-09-20 DIAGNOSIS — D128 Benign neoplasm of rectum: Secondary | ICD-10-CM | POA: Diagnosis not present

## 2015-09-20 DIAGNOSIS — D122 Benign neoplasm of ascending colon: Secondary | ICD-10-CM | POA: Diagnosis not present

## 2015-09-20 DIAGNOSIS — Z1211 Encounter for screening for malignant neoplasm of colon: Secondary | ICD-10-CM | POA: Diagnosis not present

## 2015-09-20 DIAGNOSIS — D126 Benign neoplasm of colon, unspecified: Secondary | ICD-10-CM | POA: Diagnosis not present

## 2015-09-20 DIAGNOSIS — K621 Rectal polyp: Secondary | ICD-10-CM | POA: Diagnosis not present

## 2015-09-20 DIAGNOSIS — Z86010 Personal history of colonic polyps: Secondary | ICD-10-CM | POA: Diagnosis not present

## 2015-09-25 DIAGNOSIS — N400 Enlarged prostate without lower urinary tract symptoms: Secondary | ICD-10-CM | POA: Diagnosis not present

## 2015-09-25 DIAGNOSIS — N138 Other obstructive and reflux uropathy: Secondary | ICD-10-CM | POA: Diagnosis not present

## 2015-09-25 DIAGNOSIS — Z0000 Encounter for general adult medical examination without abnormal findings: Secondary | ICD-10-CM | POA: Diagnosis not present

## 2015-12-26 DIAGNOSIS — E291 Testicular hypofunction: Secondary | ICD-10-CM | POA: Diagnosis not present

## 2015-12-26 DIAGNOSIS — Z79899 Other long term (current) drug therapy: Secondary | ICD-10-CM | POA: Diagnosis not present

## 2015-12-26 DIAGNOSIS — N400 Enlarged prostate without lower urinary tract symptoms: Secondary | ICD-10-CM | POA: Diagnosis not present

## 2016-03-12 DIAGNOSIS — Z23 Encounter for immunization: Secondary | ICD-10-CM | POA: Diagnosis not present

## 2016-04-18 DIAGNOSIS — H5203 Hypermetropia, bilateral: Secondary | ICD-10-CM | POA: Diagnosis not present

## 2016-04-18 DIAGNOSIS — H1851 Endothelial corneal dystrophy: Secondary | ICD-10-CM | POA: Diagnosis not present

## 2016-04-18 DIAGNOSIS — H52223 Regular astigmatism, bilateral: Secondary | ICD-10-CM | POA: Diagnosis not present

## 2016-04-18 DIAGNOSIS — H524 Presbyopia: Secondary | ICD-10-CM | POA: Diagnosis not present

## 2016-04-18 DIAGNOSIS — H2513 Age-related nuclear cataract, bilateral: Secondary | ICD-10-CM | POA: Diagnosis not present

## 2016-04-23 DIAGNOSIS — N401 Enlarged prostate with lower urinary tract symptoms: Secondary | ICD-10-CM | POA: Diagnosis not present

## 2016-04-23 DIAGNOSIS — R351 Nocturia: Secondary | ICD-10-CM | POA: Diagnosis not present

## 2016-04-23 DIAGNOSIS — N5201 Erectile dysfunction due to arterial insufficiency: Secondary | ICD-10-CM | POA: Diagnosis not present

## 2016-06-11 DIAGNOSIS — L570 Actinic keratosis: Secondary | ICD-10-CM | POA: Diagnosis not present

## 2016-06-11 DIAGNOSIS — Z85828 Personal history of other malignant neoplasm of skin: Secondary | ICD-10-CM | POA: Diagnosis not present

## 2016-06-11 DIAGNOSIS — D225 Melanocytic nevi of trunk: Secondary | ICD-10-CM | POA: Diagnosis not present

## 2016-06-11 DIAGNOSIS — D1800 Hemangioma unspecified site: Secondary | ICD-10-CM | POA: Diagnosis not present

## 2016-06-11 DIAGNOSIS — D485 Neoplasm of uncertain behavior of skin: Secondary | ICD-10-CM | POA: Diagnosis not present

## 2016-06-11 DIAGNOSIS — Z23 Encounter for immunization: Secondary | ICD-10-CM | POA: Diagnosis not present

## 2016-06-11 DIAGNOSIS — L821 Other seborrheic keratosis: Secondary | ICD-10-CM | POA: Diagnosis not present

## 2016-06-11 DIAGNOSIS — L814 Other melanin hyperpigmentation: Secondary | ICD-10-CM | POA: Diagnosis not present

## 2016-06-20 DIAGNOSIS — E559 Vitamin D deficiency, unspecified: Secondary | ICD-10-CM | POA: Diagnosis not present

## 2016-06-20 DIAGNOSIS — Z79899 Other long term (current) drug therapy: Secondary | ICD-10-CM | POA: Diagnosis not present

## 2016-06-20 DIAGNOSIS — E291 Testicular hypofunction: Secondary | ICD-10-CM | POA: Diagnosis not present

## 2016-06-25 DIAGNOSIS — M1711 Unilateral primary osteoarthritis, right knee: Secondary | ICD-10-CM | POA: Diagnosis not present

## 2016-08-06 DIAGNOSIS — R310 Gross hematuria: Secondary | ICD-10-CM | POA: Diagnosis not present

## 2016-08-13 DIAGNOSIS — Z79899 Other long term (current) drug therapy: Secondary | ICD-10-CM | POA: Diagnosis not present

## 2016-08-13 DIAGNOSIS — S83242A Other tear of medial meniscus, current injury, left knee, initial encounter: Secondary | ICD-10-CM | POA: Diagnosis not present

## 2016-08-13 DIAGNOSIS — E291 Testicular hypofunction: Secondary | ICD-10-CM | POA: Diagnosis not present

## 2016-08-13 DIAGNOSIS — S83241A Other tear of medial meniscus, current injury, right knee, initial encounter: Secondary | ICD-10-CM | POA: Diagnosis not present

## 2016-08-15 ENCOUNTER — Encounter: Payer: Self-pay | Admitting: Cardiology

## 2016-08-20 ENCOUNTER — Ambulatory Visit (INDEPENDENT_AMBULATORY_CARE_PROVIDER_SITE_OTHER): Payer: Medicare Other | Admitting: Cardiology

## 2016-08-20 ENCOUNTER — Encounter: Payer: Self-pay | Admitting: Cardiology

## 2016-08-20 ENCOUNTER — Encounter (INDEPENDENT_AMBULATORY_CARE_PROVIDER_SITE_OTHER): Payer: Self-pay

## 2016-08-20 VITALS — BP 126/70 | HR 72 | Ht 73.5 in | Wt 168.1 lb

## 2016-08-20 DIAGNOSIS — Z8249 Family history of ischemic heart disease and other diseases of the circulatory system: Secondary | ICD-10-CM | POA: Diagnosis not present

## 2016-08-20 DIAGNOSIS — R0789 Other chest pain: Secondary | ICD-10-CM | POA: Diagnosis not present

## 2016-08-20 DIAGNOSIS — M25561 Pain in right knee: Secondary | ICD-10-CM | POA: Diagnosis not present

## 2016-08-20 DIAGNOSIS — Z7689 Persons encountering health services in other specified circumstances: Secondary | ICD-10-CM | POA: Diagnosis not present

## 2016-08-20 NOTE — Progress Notes (Signed)
Cardiology Office Note    Date:  08/20/2016   ID:  Nicholas Mahoney, DOB 08/03/1945, MRN [id number]  PCP:  [name], MD  Cardiologist:   Candee Furbish, MD   Chief Complaint  Patient presents with  . Establish Care    History of Present Illness:  Nicholas Mahoney is a 71 y.o. male here for evaluation of hyperlipidemia, cardiac risk factors at the request of Dr. [name]. Back in 2009, July, we performed cardiac catheterization which showed no significant CAD had normal ejection fraction after false positive inferior wall abnormality on nuclear stress test.  He was last seen by me on 11/27/12. He went to come in to get a cardiac checkup. At that time, he was taking his simvastatin without any difficulties. He was traveling internationally, [place], [place], [place].  His mother died of an MI at age 28, brother had MI at age 60. Nonsmoker, retired Comptroller.  His brother recently had "3 valves replaced ". His brothers heart doctor was surprised by this finding. He did not want to be caught off guard. He comes in today for this evaluation.  Prior AST minimally elevated at 40.  Prior EKG showed normal sinus rhythm with no changes from prior.  He has been at high altitude 8-10,000 feet able to traverse that territory without any difficulty. He did however have one episode of heartburn atypical chest pain for which she took antacids and slowly resolved itself.    Past Medical History:  Diagnosis Date  . Bladder outlet obstruction   . BPH (benign prostatic hyperplasia)   . ED (erectile dysfunction)   . Hyperlipidemia   . Lower urinary tract symptoms (LUTS)   . Migraines   . 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 OA (osteoarthritis) of knee   . Vitamin D deficiency   . Wears glasses   . Wears hearing aid    bilateral    Past Surgical History:  Procedure Laterality Date  . APPENDECTOMY  1980's  . CARDIAC CATHETERIZATION  01-26-2008  dr Marlou Porch     abnormal myoview/  no sig. cad,  normal LVF,  ef 60%,  false positive myoview  . GREEN LIGHT LASER TURP (TRANSURETHRAL RESECTION OF PROSTATE N/A 06/13/2015   Procedure: GREEN LIGHT LASER TURP (TRANSURETHRAL RESECTION OF PROSTATE;  Surgeon: [name], MD;  Location: [facility];  Service: Urology;  Laterality: N/A;  . HERNIA REPAIR  infant  . KNEE ARTHROSCOPY W/ MENISCECTOMY Bilateral left  10-04-2008/  right 11-09-2007   and chondraplasty/  synovectomy  . VASECTOMY  1980's    Current Medications: Outpatient Medications Prior to Visit  Medication Sig Dispense Refill  . aspirin 81 MG tablet Take 1 tablet (81 mg total) by mouth daily. 30 tablet   . Cholecalciferol (VITAMIN D3) 2000 UNITS TABS Take 1 tablet by mouth daily.    . Ibuprofen (ADVIL) 200 MG CAPS Take by mouth as needed.    . simvastatin (ZOCOR) 20 MG tablet Take 1 tablet (20 mg total) by mouth daily. 90 tablet 3  . tamsulosin (FLOMAX) 0.4 MG CAPS capsule Take 1 capsule (0.4 mg total) by mouth daily after supper. 30 capsule 0  . cephALEXin (KEFLEX) 500 MG capsule Take 1 capsule (500 mg total) by mouth at bedtime. 10 capsule 0  . oxyCODONE-acetaminophen (ROXICET) 5-325 MG tablet Take 1 tablet by mouth every 4 (four) hours as needed for severe pain. 30 tablet 0   No facility-administered medications prior  to visit.      Allergies:   Patient has no allergy information on record.   Social History   Social History  . Marital status: Married    Spouse name: N/A  . Number of children: N/A  . Years of education: N/A   Social History Main Topics  . Smoking status: Never Smoker  . Smokeless tobacco: Never Used  . Alcohol use 0.0 oz/week    1 - 2 Glasses of wine per week  . Drug use: No  . Sexual activity: Not Asked     Comment: vasectomy   Other Topics Concern  . None   Social History Narrative  . None     Family History:  The patient's family history includes Cancer in his father and mother;  Coronary artery disease in his brother and mother; Diabetes in his brother and mother; Heart attack in his father.   ROS:   Please see the history of present illness.    ROS All other systems reviewed and are negative.   PHYSICAL EXAM:   VS:  BP 126/70   Pulse 72   Ht 6' 1.5" (1.867 m)   Wt 168 lb 1.9 oz (76.3 kg)   SpO2 98%   BMI 21.88 kg/m    GEN: Well nourished, well developed, in no acute distress  HEENT: normal  Neck: no JVD, carotid bruits, or masses Cardiac: RRR; no murmurs, rubs, or gallops,no edema  Respiratory:  clear to auscultation bilaterally, normal work of breathing GI: soft, nontender, nondistended, + BS MS: no deformity or atrophy  Skin: warm and dry, no rash Neuro:  Alert and Oriented x 3, Strength and sensation are intact Psych: euthymic mood, full affect  Wt Readings from Last 3 Encounters:  08/20/16 168 lb 1.9 oz (76.3 kg)  06/13/15 166 lb 8 oz (75.5 kg)      Studies/Labs Reviewed:   EKG:  EKG is ordered today.  The ekg ordered today demonstrates Sinus rhythm 68 no changes from prior. Personally viewed. No ST changes.  Recent Labs: No results found for requested labs within last 8760 hours.   Lipid Panel    Component Value Date/Time   CHOL 140 05/10/2013 0744   TRIG 68.0 05/10/2013 0744   HDL 54.40 05/10/2013 0744   CHOLHDL 3 05/10/2013 0744   VLDL 13.6 05/10/2013 0744   LDLCALC 72 05/10/2013 0744    Additional studies/ records that were reviewed today include:  Cardiac catheterization in July 2009 showed no significant CAD. False positive nuclear stress test prompted this catheterization.    ASSESSMENT:    1. Atypical chest pain   2. Encounter to establish care with new doctor   3. Family history of coronary artery disease      PLAN:  In order of problems listed above:  Hyperlipidemia  - Overall doing well on simvastatin. No complaints. Labs are continued to be followed by Dr. Addison Lank.  - No symptoms. Doing well.  Atypical  chest pain  - We will check an echocardiogram to ensure proper structure and function of his heart especially in light of his brother who just had 3 valves replaced. I do not appreciate any significant murmurs on exam. He did have an episode of atypical chest pain, GERD-like symptoms. These have resolved.  - Prior cardiac catheterization in 2009 looked excellent.  Family history of coronary disease/valvular disease  - Continue with excellent primary prevention.  - He is currently holding his aspirin because of mild hematuria. He sees Dr.  Eskridge with urology, prior TURP procedure after urinary retention led to delirium   Medication Adjustments/Labs and Tests Ordered: Current medicines are reviewed at length with the patient today.  Concerns regarding medicines are outlined above.  Medication changes, Labs and Tests ordered today are listed in the Patient Instructions below. Patient Instructions  Medication Instructions:  The current medical regimen is effective;  continue present plan and medications.  Testing/Procedures: Your physician has requested that you have an echocardiogram. Echocardiography is a painless test that uses sound waves to create images of your heart. It provides your doctor with information about the size and shape of your heart and how well your heart's chambers and valves are working. This procedure takes approximately one hour. There are no restrictions for this procedure.  Follow-Up: Follow up as needed after the above testing.  If you need a refill on your cardiac medications before your next appointment, please call your pharmacy.  Thank you for choosing [facility]!!        Signed, Candee Furbish, MD  08/20/2016 2:38 PM    [address], [place], [place]  [address] Phone: [phone]; Fax: [phone]

## 2016-08-20 NOTE — Patient Instructions (Signed)
Medication Instructions:  The current medical regimen is effective;  continue present plan and medications.  Testing/Procedures: Your physician has requested that you have an echocardiogram. Echocardiography is a painless test that uses sound waves to create images of your heart. It provides your doctor with information about the size and shape of your heart and how well your heart's chambers and valves are working. This procedure takes approximately one hour. There are no restrictions for this procedure.  Follow-Up: Follow up as needed after the above testing.  If you need a refill on your cardiac medications before your next appointment, please call your pharmacy.  Thank you for choosing [facility]!!     

## 2016-08-30 ENCOUNTER — Other Ambulatory Visit: Payer: Self-pay

## 2016-08-30 ENCOUNTER — Ambulatory Visit (HOSPITAL_COMMUNITY): Payer: Medicare Other | Attending: Cardiology

## 2016-08-30 DIAGNOSIS — I071 Rheumatic tricuspid insufficiency: Secondary | ICD-10-CM | POA: Insufficient documentation

## 2016-08-30 DIAGNOSIS — R0789 Other chest pain: Secondary | ICD-10-CM

## 2016-08-30 DIAGNOSIS — Z8249 Family history of ischemic heart disease and other diseases of the circulatory system: Secondary | ICD-10-CM | POA: Insufficient documentation

## 2016-09-03 ENCOUNTER — Telehealth: Payer: Self-pay | Admitting: Cardiology

## 2016-09-03 ENCOUNTER — Encounter: Payer: Self-pay | Admitting: Cardiology

## 2016-09-03 NOTE — Telephone Encounter (Signed)
Results of echo were reviewed with pt who states understanding.

## 2016-09-03 NOTE — Telephone Encounter (Signed)
New message   Pt is leaving the country on Thursday morning. Would like someone to interpret his echo results if possible before then. He says he needs to better understand the results.

## 2016-09-03 NOTE — Telephone Encounter (Signed)
Dr Marlou Porch called this patient and reviewed his echo results in full detail.  All questions were answered to the patient's satisfaction.  Please see the following t/p note if any questions.

## 2016-09-03 NOTE — Telephone Encounter (Signed)
Discussed echocardiogram results. Left ventricle appears normal. Reassuring. His right ventricle and right atrium personally viewed are mildly enlarged. Perhaps this is because of his tall stature. He does not complain of any significant shortness of breath. In fact he is troubled at times at high altitudes and has not had any significant issues. He does spend some time on airplanes, up to 3 times per year and he does get up and walk the length of the plane on the strips. I did discuss with him the possibility that if thromboembolism were to occur this could result in increased right ventricular size. If he has symptoms of shortness of breath or significant unilateral leg swelling for instance or chest pain in this regard, we will I will low threshold for ordering CT scan. He feels comfortable currently. He will let me know if any symptoms change. He appreciated the phone call. Candee Furbish, MD

## 2016-09-17 IMAGING — CR DG LUMBAR SPINE 2-3V
3 series · 3 of 3 positions shown · non-contrast
Comparison: None in PACs

CLINICAL DATA: Chronic low back pain with increased symptoms over
the past 6 weeks especially with prolonged sitting, no radicular
symptoms.

EXAM:
LUMBAR SPINE - 2-3 VIEW

[w lumbar spine ap]
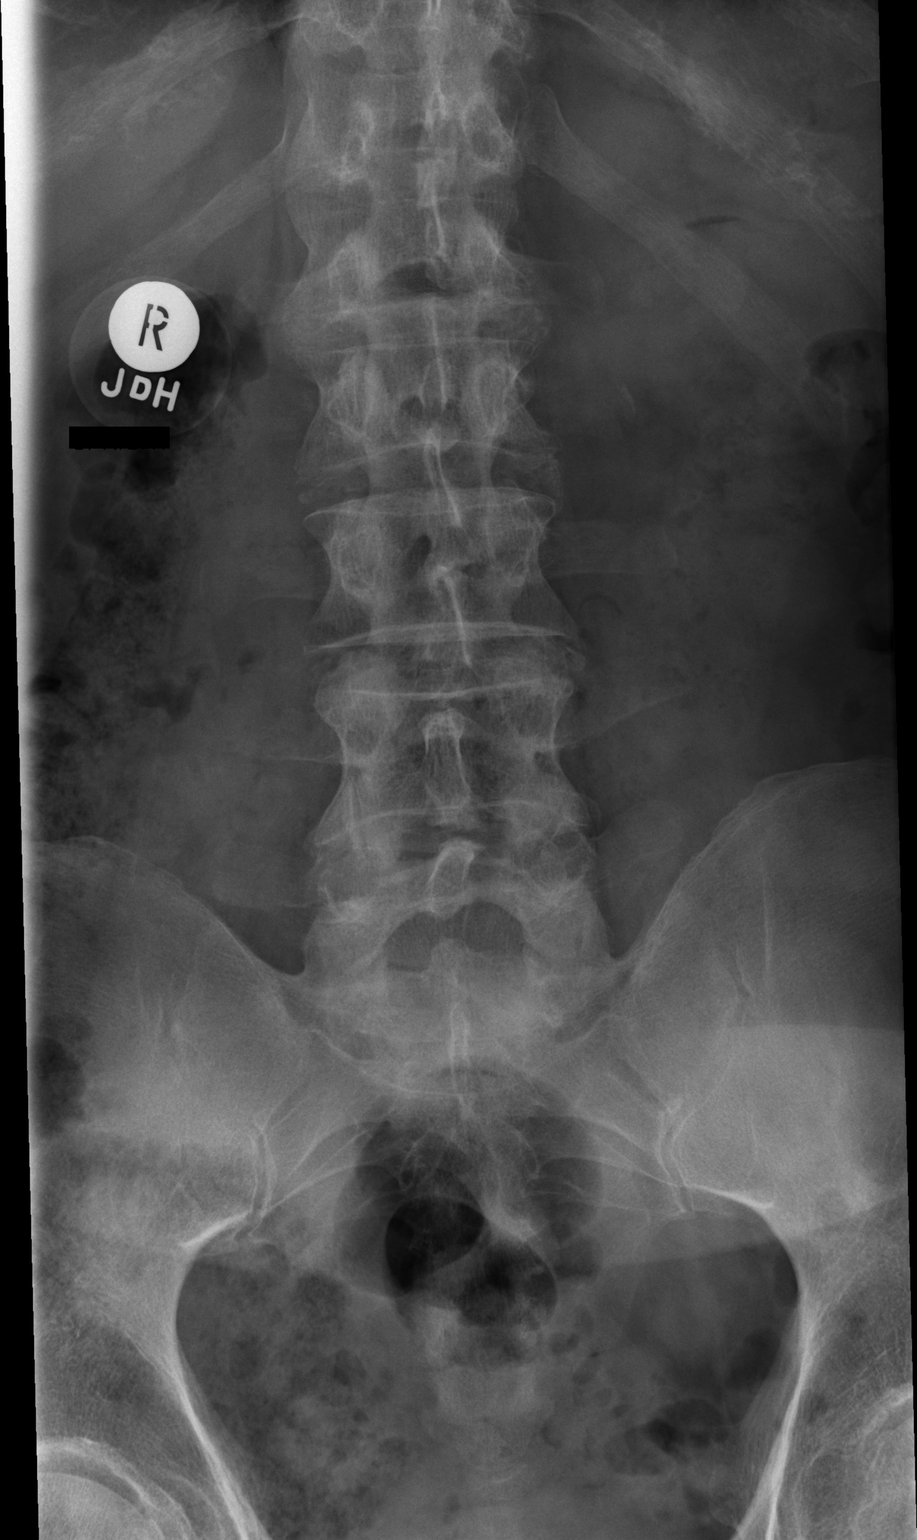

[w lumbar spine lat]
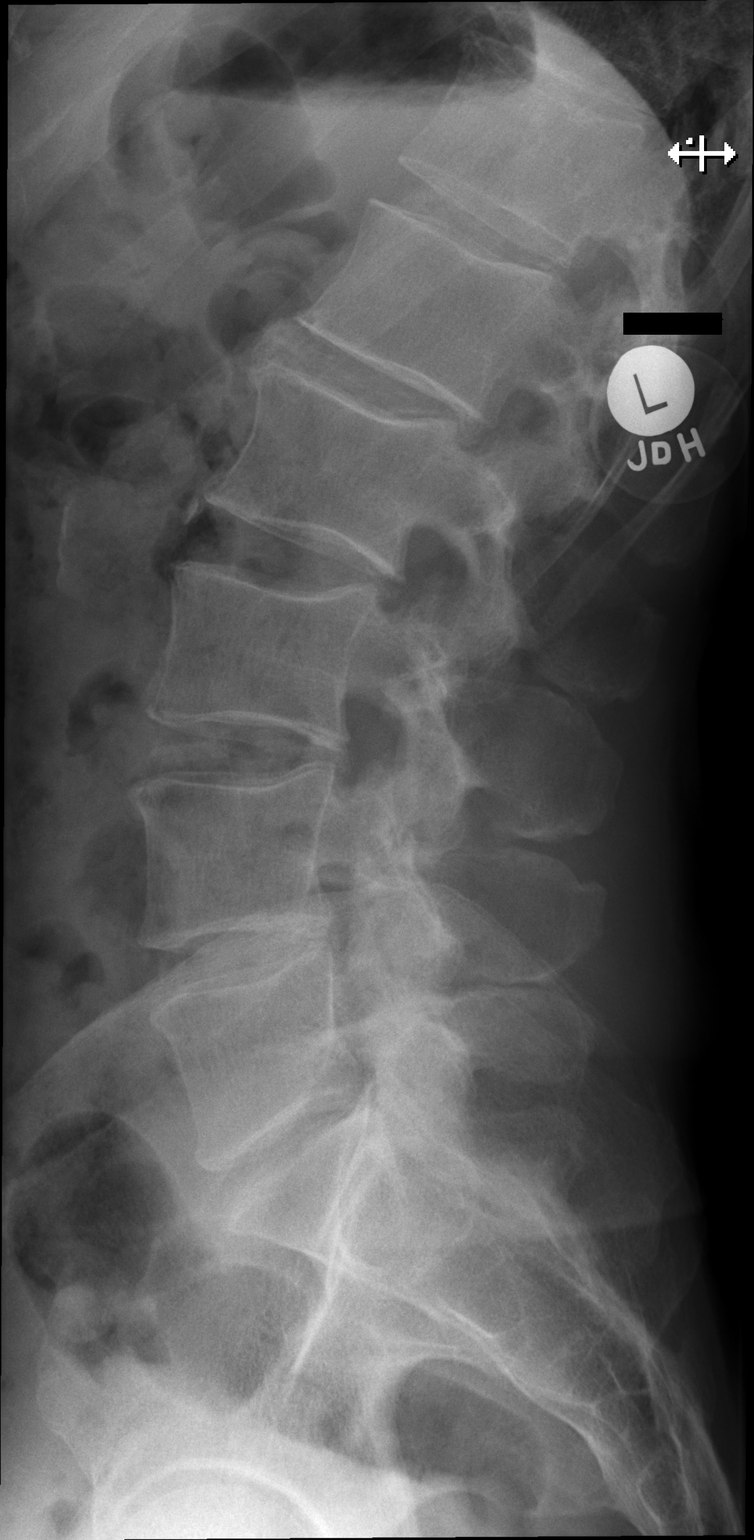

[w lumbar l-5 s-1 spot]
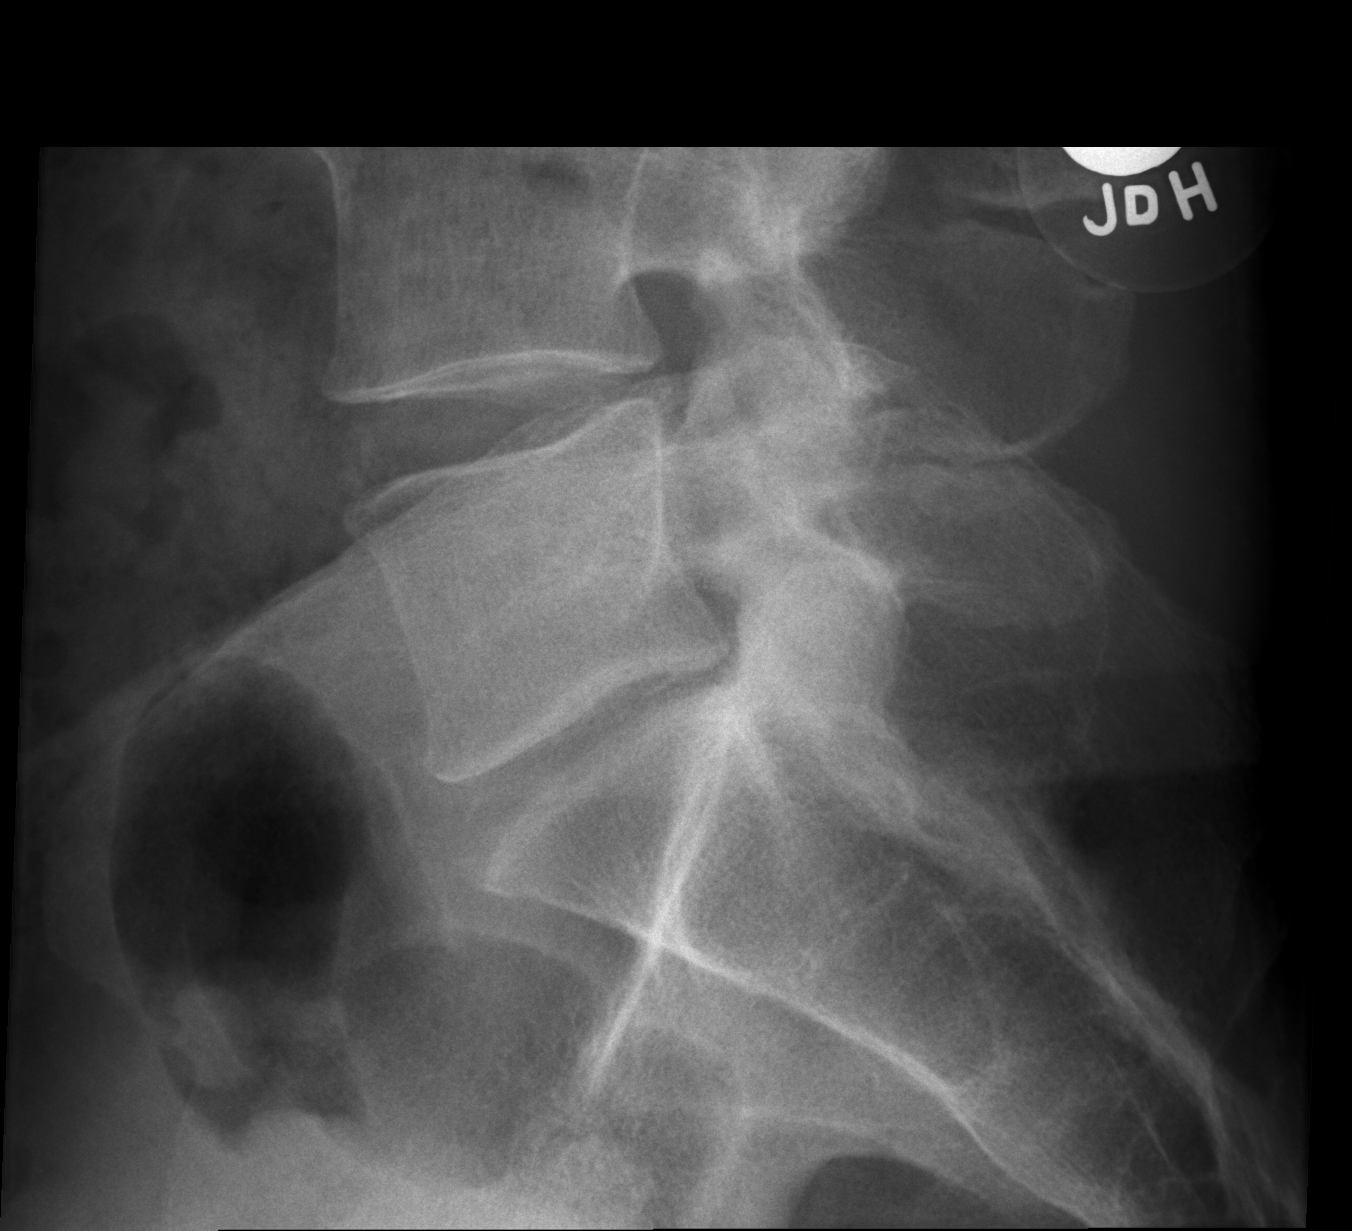

[3 of 3 positions shown; findings below may reference images not displayed]

FINDINGS: The lumbar vertebral bodies are preserved in height. There is mild
disc space narrowing at L4-5 and at L5-S1. There is facet joint
hypertrophy at L3-4, L4-5, and L5-S1. There is no spondylolisthesis.
The pedicles and transverse processes are intact. The observed
portions of the sacrum are unremarkable.
IMPRESSION: Degenerative narrowing of the L4-5 and L5-S1 discs with facet joint
hypertrophy at these levels as well as L3-4. There is no acute bony
abnormality.

## 2016-10-01 DIAGNOSIS — N529 Male erectile dysfunction, unspecified: Secondary | ICD-10-CM | POA: Diagnosis not present

## 2016-10-01 DIAGNOSIS — N400 Enlarged prostate without lower urinary tract symptoms: Secondary | ICD-10-CM | POA: Diagnosis not present

## 2016-10-01 DIAGNOSIS — R51 Headache: Secondary | ICD-10-CM | POA: Diagnosis not present

## 2016-10-01 DIAGNOSIS — Z86010 Personal history of colonic polyps: Secondary | ICD-10-CM | POA: Diagnosis not present

## 2016-10-01 DIAGNOSIS — G479 Sleep disorder, unspecified: Secondary | ICD-10-CM | POA: Diagnosis not present

## 2016-10-01 DIAGNOSIS — E785 Hyperlipidemia, unspecified: Secondary | ICD-10-CM | POA: Diagnosis not present

## 2016-10-01 DIAGNOSIS — E559 Vitamin D deficiency, unspecified: Secondary | ICD-10-CM | POA: Diagnosis not present

## 2016-10-01 DIAGNOSIS — M25569 Pain in unspecified knee: Secondary | ICD-10-CM | POA: Diagnosis not present

## 2016-10-01 DIAGNOSIS — R197 Diarrhea, unspecified: Secondary | ICD-10-CM | POA: Diagnosis not present

## 2016-10-01 DIAGNOSIS — Z0000 Encounter for general adult medical examination without abnormal findings: Secondary | ICD-10-CM | POA: Diagnosis not present

## 2016-10-01 DIAGNOSIS — Z1389 Encounter for screening for other disorder: Secondary | ICD-10-CM | POA: Diagnosis not present

## 2016-10-02 DIAGNOSIS — R310 Gross hematuria: Secondary | ICD-10-CM | POA: Diagnosis not present

## 2016-10-07 DIAGNOSIS — N2889 Other specified disorders of kidney and ureter: Secondary | ICD-10-CM | POA: Diagnosis not present

## 2016-10-07 DIAGNOSIS — N400 Enlarged prostate without lower urinary tract symptoms: Secondary | ICD-10-CM | POA: Diagnosis not present

## 2016-10-07 DIAGNOSIS — N200 Calculus of kidney: Secondary | ICD-10-CM | POA: Diagnosis not present

## 2016-10-07 DIAGNOSIS — N281 Cyst of kidney, acquired: Secondary | ICD-10-CM | POA: Diagnosis not present

## 2016-10-07 DIAGNOSIS — R310 Gross hematuria: Secondary | ICD-10-CM | POA: Diagnosis not present

## 2016-10-17 ENCOUNTER — Other Ambulatory Visit: Payer: Self-pay | Admitting: Urology

## 2016-10-17 DIAGNOSIS — R310 Gross hematuria: Secondary | ICD-10-CM

## 2016-10-17 DIAGNOSIS — N281 Cyst of kidney, acquired: Secondary | ICD-10-CM

## 2016-11-05 ENCOUNTER — Ambulatory Visit (HOSPITAL_COMMUNITY)
Admission: RE | Admit: 2016-11-05 | Discharge: 2016-11-05 | Disposition: A | Discharge status: Home / Self Care | Payer: Medicare Other | Source: Ambulatory Visit | Attending: Urology | Admitting: Urology

## 2016-11-05 DIAGNOSIS — M5136 Other intervertebral disc degeneration, lumbar region: Secondary | ICD-10-CM | POA: Diagnosis not present

## 2016-11-05 DIAGNOSIS — K5730 Diverticulosis of large intestine without perforation or abscess without bleeding: Secondary | ICD-10-CM | POA: Insufficient documentation

## 2016-11-05 DIAGNOSIS — N281 Cyst of kidney, acquired: Secondary | ICD-10-CM | POA: Diagnosis not present

## 2016-11-05 DIAGNOSIS — I700 Atherosclerosis of aorta: Secondary | ICD-10-CM | POA: Insufficient documentation

## 2016-11-05 DIAGNOSIS — R310 Gross hematuria: Secondary | ICD-10-CM

## 2016-11-05 MED ORDER — GADOBENATE DIMEGLUMINE 529 MG/ML IV SOLN
15.0000 mL | Freq: Once | INTRAVENOUS | Status: AC | PRN
  Administered 2016-11-05: 15 mL via INTRAVENOUS

## 2016-11-29 DIAGNOSIS — M25569 Pain in unspecified knee: Secondary | ICD-10-CM | POA: Diagnosis not present

## 2016-11-29 DIAGNOSIS — E559 Vitamin D deficiency, unspecified: Secondary | ICD-10-CM | POA: Diagnosis not present

## 2016-11-29 DIAGNOSIS — R51 Headache: Secondary | ICD-10-CM | POA: Diagnosis not present

## 2016-11-29 DIAGNOSIS — N529 Male erectile dysfunction, unspecified: Secondary | ICD-10-CM | POA: Diagnosis not present

## 2016-11-29 DIAGNOSIS — R197 Diarrhea, unspecified: Secondary | ICD-10-CM | POA: Diagnosis not present

## 2016-11-29 DIAGNOSIS — N400 Enlarged prostate without lower urinary tract symptoms: Secondary | ICD-10-CM | POA: Diagnosis not present

## 2016-11-29 DIAGNOSIS — E785 Hyperlipidemia, unspecified: Secondary | ICD-10-CM | POA: Diagnosis not present

## 2016-11-29 DIAGNOSIS — Z0000 Encounter for general adult medical examination without abnormal findings: Secondary | ICD-10-CM | POA: Diagnosis not present

## 2016-11-29 DIAGNOSIS — Z86010 Personal history of colonic polyps: Secondary | ICD-10-CM | POA: Diagnosis not present

## 2016-11-29 DIAGNOSIS — G479 Sleep disorder, unspecified: Secondary | ICD-10-CM | POA: Diagnosis not present

## 2017-01-21 DIAGNOSIS — R238 Other skin changes: Secondary | ICD-10-CM | POA: Diagnosis not present

## 2017-01-21 DIAGNOSIS — R310 Gross hematuria: Secondary | ICD-10-CM | POA: Diagnosis not present

## 2017-01-21 DIAGNOSIS — S70361A Insect bite (nonvenomous), right thigh, initial encounter: Secondary | ICD-10-CM | POA: Diagnosis not present

## 2017-01-21 DIAGNOSIS — W57XXXA Bitten or stung by nonvenomous insect and other nonvenomous arthropods, initial encounter: Secondary | ICD-10-CM | POA: Diagnosis not present

## 2017-01-21 DIAGNOSIS — S90862A Insect bite (nonvenomous), left foot, initial encounter: Secondary | ICD-10-CM | POA: Diagnosis not present

## 2017-01-27 ENCOUNTER — Other Ambulatory Visit: Payer: Self-pay

## 2017-01-27 DIAGNOSIS — N281 Cyst of kidney, acquired: Secondary | ICD-10-CM | POA: Diagnosis not present

## 2017-01-27 DIAGNOSIS — N400 Enlarged prostate without lower urinary tract symptoms: Secondary | ICD-10-CM | POA: Diagnosis not present

## 2017-01-28 DIAGNOSIS — Z79899 Other long term (current) drug therapy: Secondary | ICD-10-CM | POA: Diagnosis not present

## 2017-01-28 DIAGNOSIS — E291 Testicular hypofunction: Secondary | ICD-10-CM | POA: Diagnosis not present

## 2017-02-04 DIAGNOSIS — E291 Testicular hypofunction: Secondary | ICD-10-CM | POA: Diagnosis not present

## 2017-02-04 DIAGNOSIS — Z6822 Body mass index (BMI) 22.0-22.9, adult: Secondary | ICD-10-CM | POA: Diagnosis not present

## 2017-02-04 DIAGNOSIS — R945 Abnormal results of liver function studies: Secondary | ICD-10-CM | POA: Diagnosis not present

## 2017-02-04 DIAGNOSIS — N400 Enlarged prostate without lower urinary tract symptoms: Secondary | ICD-10-CM | POA: Diagnosis not present

## 2017-03-18 DIAGNOSIS — K769 Liver disease, unspecified: Secondary | ICD-10-CM | POA: Diagnosis not present

## 2017-03-18 DIAGNOSIS — R197 Diarrhea, unspecified: Secondary | ICD-10-CM | POA: Diagnosis not present

## 2017-03-18 DIAGNOSIS — R143 Flatulence: Secondary | ICD-10-CM | POA: Diagnosis not present

## 2017-03-18 DIAGNOSIS — R932 Abnormal findings on diagnostic imaging of liver and biliary tract: Secondary | ICD-10-CM | POA: Diagnosis not present

## 2017-03-18 DIAGNOSIS — Z86010 Personal history of colonic polyps: Secondary | ICD-10-CM | POA: Diagnosis not present

## 2017-03-19 ENCOUNTER — Telehealth: Payer: Self-pay | Admitting: *Deleted

## 2017-03-19 NOTE — Telephone Encounter (Signed)
Spoke with patient who is aware Dr Marlou Porch reviewed the lipid results he dropped by and it is OK for pt to continue with diet only.  He does not need to restart a statin medication at this time.  Pt was very grateful for the call and the information.

## 2017-03-26 DIAGNOSIS — R197 Diarrhea, unspecified: Secondary | ICD-10-CM | POA: Diagnosis not present

## 2017-04-04 ENCOUNTER — Encounter: Payer: Self-pay | Admitting: Nurse Practitioner

## 2017-04-04 ENCOUNTER — Telehealth: Payer: Self-pay | Admitting: Nurse Practitioner

## 2017-04-04 DIAGNOSIS — Z23 Encounter for immunization: Secondary | ICD-10-CM | POA: Diagnosis not present

## 2017-04-04 NOTE — Telephone Encounter (Signed)
Appt has been scheduled for the pt to see Lattie Haw on 10/3 at 9am, labs at 830am. Left the appt information on the pt's vm. Letter mailed to the pt and faxed to the referring.

## 2017-04-09 ENCOUNTER — Ambulatory Visit: Payer: Medicare Other | Admitting: Nurse Practitioner

## 2017-04-10 ENCOUNTER — Ambulatory Visit (HOSPITAL_BASED_OUTPATIENT_CLINIC_OR_DEPARTMENT_OTHER): Payer: Medicare Other

## 2017-04-10 ENCOUNTER — Telehealth: Payer: Self-pay | Admitting: Oncology

## 2017-04-10 ENCOUNTER — Ambulatory Visit (HOSPITAL_BASED_OUTPATIENT_CLINIC_OR_DEPARTMENT_OTHER): Payer: Medicare Other | Admitting: Oncology

## 2017-04-10 DIAGNOSIS — E785 Hyperlipidemia, unspecified: Secondary | ICD-10-CM | POA: Diagnosis not present

## 2017-04-10 HISTORY — DX: Hereditary hemochromatosis: E83.110

## 2017-04-10 NOTE — Telephone Encounter (Signed)
Scheduled appt per 10/4 los - Gave patient AVS and calender per los.  

## 2017-04-10 NOTE — Progress Notes (Signed)
Nicholas Mahoney New Patient Consult   Referring MD: Daundre Biel 71 y.o.  Aug 31, 1945    Reason for Referral: Hereditary hemochromatosis   HPI: Nicholas Mahoney is followed by Dr. Junious Silk for prostatic hypertrophy. He underwent a TUR in 2016. He had an episode of gross hematuria in January 2018. 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 He was referred for an MRI of the abdomen on 11/05/2016. There was evidence of iron overload in the liver. Multiple benign-appearing cysts were noted in the kidneys.  He was referred to Dr. Oletta Lamas for evaluation of the liver finding. Serum iron studies on 03/18/2017. Remarkable for a serum iron of 158, transferrin 186, and percent transferrin saturation of 61. The ferritin returned elevated at 444.6. A liver panel found the albumin 4.3, bilirubin 0.5, alkaline phosphatase 65, AST 35, ALT 30. 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  Nicholas Mahoney is referred for phlebotomy therapy.   Past Medical History:  Diagnosis Date  . Bladder outlet obstruction   . BPH (benign prostatic hyperplasia)   . ED (erectile dysfunction)   . Hyperlipidemia   . Lower urinary tract symptoms (LUTS)   . Migraines   . 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 OA (osteoarthritis) of knee   . Vitamin D deficiency   . Wears glasses   . Wears hearing aid    bilateral    .  Hereditary hemachromatosis   .  Chronic diarrhea   .  Positional vertigo  Past Surgical History:  Procedure Laterality Date  . APPENDECTOMY  1980's  . CARDIAC CATHETERIZATION  01-26-2008  dr Marlou Porch   abnormal myoview/  no sig. cad,  normal LVF,  ef 60%,  false positive myoview  . GREEN LIGHT LASER TURP (TRANSURETHRAL RESECTION OF PROSTATE N/A 06/13/2015   Procedure: GREEN LIGHT LASER TURP (TRANSURETHRAL RESECTION OF PROSTATE;  Surgeon:  [name], MD;  Location: [facility];  Service: Urology;  Laterality: N/A;  . HERNIA REPAIR  infant  . KNEE ARTHROSCOPY W/ MENISCECTOMY Bilateral left  10-04-2008/  right 11-09-2007   and chondraplasty/  synovectomy  . VASECTOMY  1980's    Medications: Reviewed  Allergies: Not on File  Family history: His father died of pancreas cancer dates 22. His mother had "cancer "and died suddenly at age 52. No other family history of cancer.   Social History:   He is retired from the [employer] bi[employer]. He lives with his wife. He travels frequently. He does not use cigarettes. Rare alcohol use. He has been a blood donor. No history of transfusions. No risk factor for HIV or hepatitis.  ROS:   Positives include:Urinary urgency and decreased stream, nocturia, episode of gross hematuria January 2018-evaluated by urology, chronic knee pain, chronic irregular bowel habits with diarrhea  A complete ROS was otherwise negative.  Physical Exam:  Blood pressure (!) 121/57, pulse 73, temperature 98.9 F (37.2 C), temperature source Oral, resp. rate 18, height 6' 1.5" (1.867 m), weight 171 lb 11.2 oz (77.9 kg), SpO2 96 %.  HEENT: Oropharynx without visible mass, neck without mass  Lungs: Clear bilaterally  Cardiac: Regular rate and rhythm  Abdomen: No hepatosplenomegaly, no apparent ascites  GU: Testes without mass  Vascular: No leg edema, bilateral lower leg varicosities, slight enlargement of the right compared to the  left lower leg.  Lymph nodes: No cervical, supraclavicular, axillary, or inguinal nodes  Neurologic: Alert and oriented, the motor exam appears intact in the upper and lower extremities  Skin: Brown discoloration at the pretibial areas bilaterally  Musculoskeletal: No spine tenderness  LAB:  As per history of present illness  Imaging:  As per history of present illness   Assessment/Plan:   1. 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   BPH, status post TUR  3.   History of positional vertigo  4.   Bilateral hearing loss   Disposition:   Nicholas Mahoney has been diagnosed with hereditary hemochromatosis. The ferritin level is elevated and there is MRI evidence of hepatic iron overload. I discussed the diagnosis of hereditary hemochromatosis, inherited pattern, and treatment options with him today. We discussed the systemic signs of iron overload. He does not manifest evidence of end organ damage, unless the elevated MCV, pretibial discoloration, and MRI indicate liver damage.  I recommend phlebotomy therapy. He agrees. The plan is to begin phlebotomy treatment today. The goal ferritin is less than 50. He understands this may take greater than 10 phlebotomy treatments.  He will return for an office visit, CBC, and ferritin level in approximately one month.  I will defer further evaluation for chronic liver disease to Dr. Oletta Lamas.  50 minutes were spent with the patient today. The majority of the time was used for counseling and coordination of care.  Nicholas Romberg, MD  04/10/2017, 3:52 PM

## 2017-04-10 NOTE — Patient Instructions (Signed)
Therapeutic Phlebotomy Therapeutic phlebotomy is the controlled removal of blood from a person's body for the purpose of treating a medical condition. The procedure is similar to donating blood. Usually, about a pint (470 mL, or 0.47L) of blood is removed. The average adult has 9-12 pints (4.3-5.7 L) of blood. Therapeutic phlebotomy may be used to treat the following medical conditions:  Hemochromatosis. This is a condition in which the blood contains too much iron.  Polycythemia vera. This is a condition in which the blood contains too many red blood cells.  Porphyria cutanea tarda. This is a disease in which an important part of hemoglobin is not made properly. It results in the buildup of abnormal amounts of porphyrins in the body.  Sickle cell disease. This is a condition in which the red blood cells form an abnormal crescent shape rather than a round shape.  Tell a health care provider about:  Any allergies you have.  All medicines you are taking, including vitamins, herbs, eye drops, creams, and over-the-counter medicines.  Any problems you or family members have had with anesthetic medicines.  Any blood disorders you have.  Any surgeries you have had.  Any medical conditions you have. What are the risks? Generally, this is a safe procedure. However, problems may occur, including:  Nausea or light-headedness.  Low blood pressure.  Soreness, bleeding, swelling, or bruising at the needle insertion site.  Infection.  What happens before the procedure?  Follow instructions from your health care provider about eating or drinking restrictions.  Ask your health care provider about changing or stopping your regular medicines. This is especially important if you are taking diabetes medicines or blood thinners.  Wear clothing with sleeves that can be raised above the elbow.  Plan to have someone take you home after the procedure.  You may have a blood sample taken. What  happens during the procedure?  A needle will be inserted into one of your veins.  Tubing and a collection bag will be attached to that needle.  Blood will flow through the needle and tubing into the collection bag.  You may be asked to open and close your hand slowly and continually during the entire collection.  After the specified amount of blood has been removed from your body, the collection bag and tubing will be clamped.  The needle will be removed from your vein.  Pressure will be held on the site of the needle insertion to stop the bleeding.  A bandage (dressing) will be placed over the needle insertion site. The procedure may vary among health care providers and hospitals. What happens after the procedure?  Your recovery will be assessed and monitored.  You can return to your normal activities as directed by your health care provider. This information is not intended to replace advice given to you by your health care provider. Make sure you discuss any questions you have with your health care provider. Document Released: 11/26/2010 Document Revised: 02/24/2016 Document Reviewed: 06/20/2014 Elsevier Interactive Patient Education  2018 [employer] Inc.  

## 2017-04-17 ENCOUNTER — Ambulatory Visit (HOSPITAL_BASED_OUTPATIENT_CLINIC_OR_DEPARTMENT_OTHER): Payer: Medicare Other

## 2017-04-17 NOTE — Patient Instructions (Signed)
Therapeutic Phlebotomy Therapeutic phlebotomy is the controlled removal of blood from a person's body for the purpose of treating a medical condition. The procedure is similar to donating blood. Usually, about a pint (470 mL, or 0.47L) of blood is removed. The average adult has 9-12 pints (4.3-5.7 L) of blood. Therapeutic phlebotomy may be used to treat the following medical conditions:  Hemochromatosis. This is a condition in which the blood contains too much iron.  Polycythemia vera. This is a condition in which the blood contains too many red blood cells.  Porphyria cutanea tarda. This is a disease in which an important part of hemoglobin is not made properly. It results in the buildup of abnormal amounts of porphyrins in the body.  Sickle cell disease. This is a condition in which the red blood cells form an abnormal crescent shape rather than a round shape.  Tell a health care provider about:  Any allergies you have.  All medicines you are taking, including vitamins, herbs, eye drops, creams, and over-the-counter medicines.  Any problems you or family members have had with anesthetic medicines.  Any blood disorders you have.  Any surgeries you have had.  Any medical conditions you have. What are the risks? Generally, this is a safe procedure. However, problems may occur, including:  Nausea or light-headedness.  Low blood pressure.  Soreness, bleeding, swelling, or bruising at the needle insertion site.  Infection.  What happens before the procedure?  Follow instructions from your health care provider about eating or drinking restrictions.  Ask your health care provider about changing or stopping your regular medicines. This is especially important if you are taking diabetes medicines or blood thinners.  Wear clothing with sleeves that can be raised above the elbow.  Plan to have someone take you home after the procedure.  You may have a blood sample taken. What  happens during the procedure?  A needle will be inserted into one of your veins.  Tubing and a collection bag will be attached to that needle.  Blood will flow through the needle and tubing into the collection bag.  You may be asked to open and close your hand slowly and continually during the entire collection.  After the specified amount of blood has been removed from your body, the collection bag and tubing will be clamped.  The needle will be removed from your vein.  Pressure will be held on the site of the needle insertion to stop the bleeding.  A bandage (dressing) will be placed over the needle insertion site. The procedure may vary among health care providers and hospitals. What happens after the procedure?  Your recovery will be assessed and monitored.  You can return to your normal activities as directed by your health care provider. This information is not intended to replace advice given to you by your health care provider. Make sure you discuss any questions you have with your health care provider. Document Released: 11/26/2010 Document Revised: 02/24/2016 Document Reviewed: 06/20/2014 Elsevier Interactive Patient Education  2018 [employer] Inc.  

## 2017-04-21 ENCOUNTER — Encounter: Payer: Self-pay | Admitting: Oncology

## 2017-04-22 DIAGNOSIS — K591 Functional diarrhea: Secondary | ICD-10-CM | POA: Diagnosis not present

## 2017-04-24 ENCOUNTER — Ambulatory Visit: Payer: Medicare Other

## 2017-04-24 ENCOUNTER — Other Ambulatory Visit (HOSPITAL_BASED_OUTPATIENT_CLINIC_OR_DEPARTMENT_OTHER): Payer: Medicare Other

## 2017-04-24 LAB — FERRITIN: Ferritin: 379 ng/ml — ABNORMAL HIGH (ref 22–316)

## 2017-04-24 NOTE — Progress Notes (Signed)
Patient presented to clinic for phlebotomy. This RN attempted to initiate phlebotomy with 16g to L AC unsuccessfully. Amy S, RN attempted 16g to R AC unsuccessfully. Patient then reported feeling faint. Cold rag applied to forehead, and drink given and observation period initiated. Patient preferred to reschedule appointment with no further attempts at phlebotomy today. Dr. Gearldine Shown nurse alerted and patient instructed to go to scheduling after discharge. Patient left without nurse knowledge in stable condition.   Wylene Simmer, BSN, RN 04/24/2017

## 2017-05-07 ENCOUNTER — Telehealth: Payer: Self-pay | Admitting: *Deleted

## 2017-05-07 NOTE — Telephone Encounter (Signed)
Call from pt reporting he woke up with chest pain overnight. He had no other symptoms. Pt reports he has significant bruising and tenderness to B forearms since last attempt at phlebotomy on 10/18. Pt wonders if the "internal bleeding" in his arms could lead to a blood clot or cause the chest pain somehow. He requests "a more experienced nurse" for his next phlebotomy appointment. Discussed with Dr. Benay Spice: Not likely the unsuccessful venipuncture caused the chest pain. Pt should see PCP if this persists.  Returned call to pt with above information. Reminded him to increase oral hydration the morning of his phlebotomies. He voiced understanding.

## 2017-05-13 ENCOUNTER — Other Ambulatory Visit (HOSPITAL_BASED_OUTPATIENT_CLINIC_OR_DEPARTMENT_OTHER): Payer: Medicare Other

## 2017-05-13 ENCOUNTER — Ambulatory Visit (HOSPITAL_BASED_OUTPATIENT_CLINIC_OR_DEPARTMENT_OTHER): Payer: Medicare Other | Admitting: Nurse Practitioner

## 2017-05-13 ENCOUNTER — Telehealth: Payer: Self-pay

## 2017-05-13 ENCOUNTER — Encounter: Payer: Self-pay | Admitting: Nurse Practitioner

## 2017-05-13 ENCOUNTER — Ambulatory Visit (HOSPITAL_BASED_OUTPATIENT_CLINIC_OR_DEPARTMENT_OTHER): Payer: Medicare Other

## 2017-05-13 LAB — CBC WITH DIFFERENTIAL/PLATELET
BASO%: 1.1 % (ref 0.0–2.0)
Basophils Absolute: 0.1 10*3/uL (ref 0.0–0.1)
EOS%: 3.7 % (ref 0.0–7.0)
Eosinophils Absolute: 0.3 10*3/uL (ref 0.0–0.5)
HCT: 43.6 % (ref 38.4–49.9)
HGB: 14.6 g/dL (ref 13.0–17.1)
LYMPH%: 27.1 % (ref 14.0–49.0)
MCH: 32.8 pg (ref 27.2–33.4)
MCHC: 33.5 g/dL (ref 32.0–36.0)
MCV: 98 fL (ref 79.3–98.0)
MONO#: 0.7 10*3/uL (ref 0.1–0.9)
MONO%: 9.6 % (ref 0.0–14.0)
NEUT%: 58.5 % (ref 39.0–75.0)
NEUTROS ABS: 4.2 10*3/uL (ref 1.5–6.5)
PLATELETS: 234 10*3/uL (ref 140–400)
RBC: 4.45 10*6/uL (ref 4.20–5.82)
RDW: 12.6 % (ref 11.0–14.6)
WBC: 7.3 10*3/uL (ref 4.0–10.3)
lymph#: 2 10*3/uL (ref 0.9–3.3)

## 2017-05-13 LAB — FERRITIN: Ferritin: 387 ng/ml — ABNORMAL HIGH (ref 22–316)

## 2017-05-13 NOTE — Progress Notes (Signed)
Nicholas Mahoney presents today for phlebotomy per MD orders. Phlebotomy procedure started at 1530 and ended at 1545 using 16g needle in L AC. Performed by Rodney Langton, RN. 500 g removed. IV needle removed intact. Patient tolerated procedure well. Patient provided with post-procedure nourishments. Patient declined waiting 30 minutes. VSS at 20 minute mark.

## 2017-05-13 NOTE — Telephone Encounter (Signed)
Printed avs and calender for upcoming appointment. Per 11/6 los 

## 2017-05-13 NOTE — Progress Notes (Addendum)
  Nicholas Mahoney   Diagnosis: Hereditary hemochromatosis  INTERVAL HISTORY:    Nicholas Mahoney returns as scheduled.  He completed phlebotomy treatments on 04/10/2017 and 04/17/2017. He had no problems with either phlebotomy.  A phlebotomy was scheduled 04/24/2017 but IV access could not be obtained.   He overall feels well.  He notes that he sweats when he is in a hot and humid environment.  Objective:  Vital signs in last 24 hours:  Blood pressure (!) 123/58, pulse 76, temperature 98.6 F (37 C), temperature source Oral, resp. rate 18, height 6' 1.5" (1.867 m), weight 171 lb 11.2 oz (77.9 kg), SpO2 98 %.    HEENT: No thrush or ulcers. Resp: Lungs clear bilaterally. Cardio: Regular rate and rhythm. GI: Abdomen soft and nontender.  No hepatosplenomegaly. Vascular: No leg edema. Skin: Brown discoloration of the pretibial regions bilaterally.   Lab Results:  Lab Results  Component Value Date   WBC 7.3 05/13/2017   HGB 14.6 05/13/2017   HCT 43.6 05/13/2017   MCV 98.0 05/13/2017   PLT 234 05/13/2017   NEUTROABS 4.2 05/13/2017    Imaging:  No results found.  Medications: I have reviewed the patient's current medications.  Assessment/Plan: 1. 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 MRI abdomen 11/05/2016 consistent with liver iron deposition ? Elevated ferritin 03/18/2017 (446) ? Phlebotomy 04/10/2017, 04/17/2017 ? Ferritin 379 04/24/2017 ? Phlebotomy 05/13/2017  2.   BPH, status post TUR  3.   History of positional vertigo  4.   Bilateral hearing loss     Disposition: Nicholas Mahoney appears stable.  He has completed 2 phlebotomy treatments.  The ferritin was improved on 04/24/2017.  Plan to continue phlebotomy therapy with a goal ferritin of less than 50.  He will have a phlebotomy treatment today, 05/20/2017 and 06/05/2017.  We will see him in follow-up on 06/17/2017.  He will contact the office in the  interim with any problems.  Patient seen with Dr. Benay Spice.    Ned Card ANP/GNP-BC   05/13/2017  3:02 PM  This was a shared visit with Ned Card.  Nicholas Mahoney has completed 2 phlebotomy treatments.  He will likely need to complete up to 10-15 more phlebotomy treatments before the ferritin is in goal range.  We discussed the treatment plan with him.  He will complete a phlebotomy treatment today.  Julieanne Manson, MD

## 2017-05-20 ENCOUNTER — Ambulatory Visit (HOSPITAL_BASED_OUTPATIENT_CLINIC_OR_DEPARTMENT_OTHER): Payer: Medicare Other

## 2017-06-05 ENCOUNTER — Ambulatory Visit (HOSPITAL_BASED_OUTPATIENT_CLINIC_OR_DEPARTMENT_OTHER): Payer: Medicare Other

## 2017-06-05 ENCOUNTER — Other Ambulatory Visit (HOSPITAL_BASED_OUTPATIENT_CLINIC_OR_DEPARTMENT_OTHER): Payer: Medicare Other

## 2017-06-05 LAB — FERRITIN: Ferritin: 310 ng/ml (ref 22–316)

## 2017-06-05 NOTE — Progress Notes (Signed)
[name] presents today for phlebotomy per MD orders. Drink and snack offered prior to start. Patient refused. Vitals WNL.   Phlebotomy procedure started at 1342 and ended at 1348. 527 grams removed.  Patient tolerated procedure well. IV needle removed intact.  Patient observed for 30 minutes, blood pressure 79/45 at 1410.   Dr. Benay Spice stated for patient to push fluids, to monitor for a little longer and to recheck BP. Pt drank two cups of ate crackers, bp now 92/52. Patient feels better. Will wait ten more minutes and recheck to ensure BP is continuing to elevate. At 1440 pt BP is now 99/50. Pt feels good to go. No concerns with dizziness or lightheadedness.   Cyndia Bent RN

## 2017-06-05 NOTE — Patient Instructions (Signed)

## 2017-06-10 DIAGNOSIS — D225 Melanocytic nevi of trunk: Secondary | ICD-10-CM | POA: Diagnosis not present

## 2017-06-10 DIAGNOSIS — Z85828 Personal history of other malignant neoplasm of skin: Secondary | ICD-10-CM | POA: Diagnosis not present

## 2017-06-10 DIAGNOSIS — Z23 Encounter for immunization: Secondary | ICD-10-CM | POA: Diagnosis not present

## 2017-06-10 DIAGNOSIS — L570 Actinic keratosis: Secondary | ICD-10-CM | POA: Diagnosis not present

## 2017-06-10 DIAGNOSIS — L821 Other seborrheic keratosis: Secondary | ICD-10-CM | POA: Diagnosis not present

## 2017-06-10 DIAGNOSIS — L814 Other melanin hyperpigmentation: Secondary | ICD-10-CM | POA: Diagnosis not present

## 2017-06-10 DIAGNOSIS — D1800 Hemangioma unspecified site: Secondary | ICD-10-CM | POA: Diagnosis not present

## 2017-06-17 ENCOUNTER — Other Ambulatory Visit (HOSPITAL_BASED_OUTPATIENT_CLINIC_OR_DEPARTMENT_OTHER): Payer: Medicare Other

## 2017-06-17 ENCOUNTER — Encounter: Payer: Self-pay | Admitting: Oncology

## 2017-06-17 ENCOUNTER — Ambulatory Visit: Payer: Medicare Other

## 2017-06-17 ENCOUNTER — Other Ambulatory Visit: Payer: Self-pay | Admitting: *Deleted

## 2017-06-17 ENCOUNTER — Ambulatory Visit (HOSPITAL_BASED_OUTPATIENT_CLINIC_OR_DEPARTMENT_OTHER): Payer: Medicare Other | Admitting: Oncology

## 2017-06-17 ENCOUNTER — Telehealth: Payer: Self-pay | Admitting: Oncology

## 2017-06-17 DIAGNOSIS — H9193 Unspecified hearing loss, bilateral: Secondary | ICD-10-CM | POA: Diagnosis not present

## 2017-06-17 LAB — CBC WITH DIFFERENTIAL/PLATELET
BASO%: 0.9 % (ref 0.0–2.0)
BASOS ABS: 0.1 10*3/uL (ref 0.0–0.1)
EOS%: 2.1 % (ref 0.0–7.0)
Eosinophils Absolute: 0.2 10*3/uL (ref 0.0–0.5)
HCT: 43.5 % (ref 38.4–49.9)
HEMOGLOBIN: 14.4 g/dL (ref 13.0–17.1)
LYMPH%: 24.2 % (ref 14.0–49.0)
MCH: 32.6 pg (ref 27.2–33.4)
MCHC: 33.1 g/dL (ref 32.0–36.0)
MCV: 98.4 fL — AB (ref 79.3–98.0)
MONO#: 0.6 10*3/uL (ref 0.1–0.9)
MONO%: 8.1 % (ref 0.0–14.0)
NEUT#: 5.1 10*3/uL (ref 1.5–6.5)
NEUT%: 64.7 % (ref 39.0–75.0)
Platelets: 256 10*3/uL (ref 140–400)
RBC: 4.42 10*6/uL (ref 4.20–5.82)
RDW: 12.6 % (ref 11.0–14.6)
WBC: 7.9 10*3/uL (ref 4.0–10.3)
lymph#: 1.9 10*3/uL (ref 0.9–3.3)

## 2017-06-17 NOTE — Progress Notes (Signed)
  [facility] OFFICE PROGRESS NOTE   Diagnosis: Hemochromatosis  INTERVAL HISTORY:   [name] completed another phlebotomy treatment on 06/05/2017.  He has completed a total of 5 phlebotomy treatments.  He reports tolerating the phlebotomy well. He has experienced several episodes of substernal chest discomfort over the last few weeks.  The episodes happen in the evening and at rest.  No significant relief with "Tums".  No associated symptoms.  He is unclear whether this represents reflux or potentially angina.   Objective:  Vital signs in last 24 hours:  Blood pressure (!) 124/55, pulse 64, temperature 98.4 F (36.9 C), temperature source Oral, resp. rate 18, height 6' (1.829 m), weight 171 lb 4.8 oz (77.7 kg), SpO2 100 %.   Resp: Lungs clear bilaterally Cardio: Regular rate and rhythm GI: No hepatosplenomegaly, nontender, no mass Vascular: Chronic stasis change at the lower leg bilaterally with varicosities   Lab Results:  Lab Results  Component Value Date   WBC 7.9 06/17/2017   HGB 14.4 06/17/2017   HCT 43.5 06/17/2017   MCV 98.4 (H) 06/17/2017   PLT 256 06/17/2017   NEUTROABS 5.1 06/17/2017   Ferritin on 06/05/2017-  310  Medications: I have reviewed the patient's current medications.  Assessment/Plan: 1. 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 MRI abdomen 11/05/2016 consistent with liver iron deposition ? Elevated ferritin 03/18/2017 (446) ? Phlebotomy 04/10/2017, 04/17/2017, 05/13/2017, 05/20/2017, 06/05/2017, 06/17/2017  2.BPH, status post TUR  3.History of positional vertigo  4.Bilateral hearing loss   Disposition:  [name] has completed 5 phlebotomy treatments.  The ferritin remained elevated on 06/05/2017.  He will complete another phlebotomy today.  He will be out of town for the next several weeks.  He will return for the next phlebotomy on 07/15/2016.  We will check the ferritin level when  he returns on 07/15/2016.  He will be scheduled for phlebotomy 07/29/2016 and an office visit/phlebotomy 08/12/2016.  The anterior chest discomfort is most likely related to gastroesophageal reflux.  He will try over-the-counter antacids.  He will follow-up with his cardiologist and Dr. Oletta Lamas if these symptoms persist.   Betsy Coder, MD  06/17/2017  2:33 PM

## 2017-06-17 NOTE — Progress Notes (Signed)
Phlebotomy attempt x 3 sticks - left, right AC with phlebotomy kit both clotted off and 3rd attempt with 18g needle.  Pt declined to be stuck again.  Pt instructed to go to scheduling to reschedule lab/phlebotomy appt.  Pt agreeable to reschedule. 

## 2017-06-17 NOTE — Telephone Encounter (Signed)
Scheduled appt per 12/11 los - Gave patient AVS and calender per los. - per patient usually gets labs every other phlebotomy.

## 2017-06-19 DIAGNOSIS — R413 Other amnesia: Secondary | ICD-10-CM | POA: Diagnosis not present

## 2017-06-19 DIAGNOSIS — Z79899 Other long term (current) drug therapy: Secondary | ICD-10-CM | POA: Diagnosis not present

## 2017-06-19 DIAGNOSIS — S83242A Other tear of medial meniscus, current injury, left knee, initial encounter: Secondary | ICD-10-CM | POA: Diagnosis not present

## 2017-06-19 DIAGNOSIS — E291 Testicular hypofunction: Secondary | ICD-10-CM | POA: Diagnosis not present

## 2017-06-19 DIAGNOSIS — M25562 Pain in left knee: Secondary | ICD-10-CM | POA: Diagnosis not present

## 2017-06-20 ENCOUNTER — Telehealth: Payer: Self-pay | Admitting: Oncology

## 2017-06-20 NOTE — Telephone Encounter (Signed)
Called patient regarding 12/11 sch message - patient said he is unable to come in next week - his next scheduled appt is when he will be back in town ,.

## 2017-07-15 ENCOUNTER — Inpatient Hospital Stay: Payer: Medicare Other | Attending: Oncology

## 2017-07-15 ENCOUNTER — Inpatient Hospital Stay: Payer: Medicare Other

## 2017-07-15 DIAGNOSIS — S83242D Other tear of medial meniscus, current injury, left knee, subsequent encounter: Secondary | ICD-10-CM | POA: Diagnosis not present

## 2017-07-15 LAB — CBC WITH DIFFERENTIAL/PLATELET
ABS GRANULOCYTE: 4.6 10*3/uL (ref 1.5–6.5)
BASOS PCT: 1 %
Basophils Absolute: 0.1 10*3/uL (ref 0.0–0.1)
Eosinophils Absolute: 0.1 10*3/uL (ref 0.0–0.5)
Eosinophils Relative: 2 %
HEMATOCRIT: 46.3 % (ref 38.4–49.9)
Hemoglobin: 15.4 g/dL (ref 13.0–17.1)
LYMPHS ABS: 1.7 10*3/uL (ref 0.9–3.3)
LYMPHS PCT: 24 %
MCH: 32.4 pg (ref 27.2–33.4)
MCHC: 33.4 g/dL (ref 32.0–36.0)
MCV: 97 fL (ref 79.3–98.0)
MONOS PCT: 7 %
Monocytes Absolute: 0.5 10*3/uL (ref 0.1–0.9)
NEUTROS ABS: 4.6 10*3/uL (ref 1.5–6.5)
Neutrophils Relative %: 66 %
Platelets: 229 10*3/uL (ref 140–400)
RBC: 4.77 MIL/uL (ref 4.20–5.82)
RDW: 12.6 % (ref 11.0–15.6)
WBC: 6.9 10*3/uL (ref 4.0–10.3)

## 2017-07-15 LAB — FERRITIN: Ferritin: 266 ng/mL (ref 22–316)

## 2017-07-15 NOTE — Patient Instructions (Signed)
Therapeutic Phlebotomy Therapeutic phlebotomy is the controlled removal of blood from a person's body for the purpose of treating a medical condition. The procedure is similar to donating blood. Usually, about a pint (470 mL, or 0.47L) of blood is removed. The average adult has 9-12 pints (4.3-5.7 L) of blood. Therapeutic phlebotomy may be used to treat the following medical conditions:  Hemochromatosis. This is a condition in which the blood contains too much iron.  Polycythemia vera. This is a condition in which the blood contains too many red blood cells.  Porphyria cutanea tarda. This is a disease in which an important part of hemoglobin is not made properly. It results in the buildup of abnormal amounts of porphyrins in the body.  Sickle cell disease. This is a condition in which the red blood cells form an abnormal crescent shape rather than a round shape.  Tell a health care provider about:  Any allergies you have.  All medicines you are taking, including vitamins, herbs, eye drops, creams, and over-the-counter medicines.  Any problems you or family members have had with anesthetic medicines.  Any blood disorders you have.  Any surgeries you have had.  Any medical conditions you have. What are the risks? Generally, this is a safe procedure. However, problems may occur, including:  Nausea or light-headedness.  Low blood pressure.  Soreness, bleeding, swelling, or bruising at the needle insertion site.  Infection.  What happens before the procedure?  Follow instructions from your health care provider about eating or drinking restrictions.  Ask your health care provider about changing or stopping your regular medicines. This is especially important if you are taking diabetes medicines or blood thinners.  Wear clothing with sleeves that can be raised above the elbow.  Plan to have someone take you home after the procedure.  You may have a blood sample taken. What  happens during the procedure?  A needle will be inserted into one of your veins.  Tubing and a collection bag will be attached to that needle.  Blood will flow through the needle and tubing into the collection bag.  You may be asked to open and close your hand slowly and continually during the entire collection.  After the specified amount of blood has been removed from your body, the collection bag and tubing will be clamped.  The needle will be removed from your vein.  Pressure will be held on the site of the needle insertion to stop the bleeding.  A bandage (dressing) will be placed over the needle insertion site. The procedure may vary among health care providers and hospitals. What happens after the procedure?  Your recovery will be assessed and monitored.  You can return to your normal activities as directed by your health care provider. This information is not intended to replace advice given to you by your health care provider. Make sure you discuss any questions you have with your health care provider. Document Released: 11/26/2010 Document Revised: 02/24/2016 Document Reviewed: 06/20/2014 Elsevier Interactive Patient Education  2018 [employer] Inc.  

## 2017-07-16 ENCOUNTER — Telehealth: Payer: Self-pay | Admitting: Emergency Medicine

## 2017-07-16 NOTE — Telephone Encounter (Addendum)
Pt verbalized understanding of this note.   ----- Message from Ladell Pier, MD sent at 07/15/2017  6:54 PM EST ----- Please call patient, ferritin improving, but still above goal range, continue phlebotomy as scheduled

## 2017-07-16 NOTE — Telephone Encounter (Addendum)
Left Vm with patient to call back regarding this note.   ----- Message from Ladell Pier, MD sent at 07/15/2017  6:54 PM EST ----- Please call patient, ferritin improving, but still above goal range, continue phlebotomy as scheduled

## 2017-07-21 DIAGNOSIS — M25562 Pain in left knee: Secondary | ICD-10-CM | POA: Diagnosis not present

## 2017-07-22 DIAGNOSIS — E291 Testicular hypofunction: Secondary | ICD-10-CM | POA: Diagnosis not present

## 2017-07-29 ENCOUNTER — Inpatient Hospital Stay: Payer: Medicare Other

## 2017-07-29 NOTE — Patient Instructions (Signed)

## 2017-08-12 ENCOUNTER — Inpatient Hospital Stay: Payer: Medicare Other

## 2017-08-12 ENCOUNTER — Inpatient Hospital Stay: Payer: Medicare Other | Attending: Oncology

## 2017-08-12 ENCOUNTER — Encounter: Payer: Self-pay | Admitting: Nurse Practitioner

## 2017-08-12 ENCOUNTER — Inpatient Hospital Stay (HOSPITAL_BASED_OUTPATIENT_CLINIC_OR_DEPARTMENT_OTHER): Payer: Medicare Other | Admitting: Nurse Practitioner

## 2017-08-12 ENCOUNTER — Telehealth: Payer: Self-pay | Admitting: Oncology

## 2017-08-12 DIAGNOSIS — R12 Heartburn: Secondary | ICD-10-CM

## 2017-08-12 DIAGNOSIS — H9193 Unspecified hearing loss, bilateral: Secondary | ICD-10-CM | POA: Diagnosis not present

## 2017-08-12 DIAGNOSIS — R7989 Other specified abnormal findings of blood chemistry: Secondary | ICD-10-CM

## 2017-08-12 LAB — CBC WITH DIFFERENTIAL/PLATELET
Basophils Absolute: 0.1 10*3/uL (ref 0.0–0.1)
Basophils Relative: 1 %
Eosinophils Absolute: 0.1 10*3/uL (ref 0.0–0.5)
Eosinophils Relative: 2 %
HEMATOCRIT: 43.3 % (ref 38.4–49.9)
Hemoglobin: 14.5 g/dL (ref 13.0–17.1)
LYMPHS PCT: 32 %
Lymphs Abs: 1.8 10*3/uL (ref 0.9–3.3)
MCH: 32.3 pg (ref 27.2–33.4)
MCHC: 33.5 g/dL (ref 32.0–36.0)
MCV: 96.2 fL (ref 79.3–98.0)
MONO ABS: 0.5 10*3/uL (ref 0.1–0.9)
MONOS PCT: 9 %
NEUTROS ABS: 3.2 10*3/uL (ref 1.5–6.5)
Neutrophils Relative %: 56 %
Platelets: 206 10*3/uL (ref 140–400)
RBC: 4.5 MIL/uL (ref 4.20–5.82)
RDW: 12.5 % (ref 11.0–14.6)
WBC: 5.7 10*3/uL (ref 4.0–10.3)

## 2017-08-12 LAB — FERRITIN: Ferritin: 125 ng/mL (ref 22–316)

## 2017-08-12 NOTE — Progress Notes (Signed)
  [facility] OFFICE PROGRESS NOTE   Diagnosis: Hemochromatosis  INTERVAL HISTORY:   Nicholas Mahoney returns as scheduled.  He completed another phlebotomy treatment on 07/29/2017.  He reports tolerating the phlebotomy well.  He continues to have intermittent "heartburn".  He is keeping a record of the episodes and his interventions.  He has follow-up with his PCP in the near future.  Objective:  Vital signs in last 24 hours:  Blood pressure 118/61, pulse 72, temperature 98.7 F (37.1 C), temperature source Oral, resp. rate 18, height 6' (1.829 m), weight 171 lb 4.8 oz (77.7 kg), SpO2 98 %.   Resp: Lungs clear bilaterally. Cardio: Regular rate and rhythm. GI: Abdomen soft and nontender.  No hepatosplenomegaly. Vascular: No leg edema.   Lab Results:  Lab Results  Component Value Date   WBC 5.7 08/12/2017   HGB 14.5 08/12/2017   HCT 43.3 08/12/2017   MCV 96.2 08/12/2017   PLT 206 08/12/2017   NEUTROABS 3.2 08/12/2017   07/15/2017 ferritin 266 Imaging:  No results found.  Medications: I have reviewed the patient's current medications.  Assessment/Plan: 1. 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 MRI abdomen 11/05/2016 consistent with liver iron deposition ? Elevated ferritin 03/18/2017(446) ? Phlebotomy 04/10/2017, 04/17/2017, 05/13/2017, 05/20/2017, 06/05/2017, 07/15/2017, 07/29/2017, 08/13/2007  2.BPH, status post TUR  3.History of positional vertigo  4.Bilateral hearing loss    Disposition: Nicholas Mahoney appears stable.  He has completed 7 phlebotomy treatments.  The ferritin is slowly improving.  He will complete another phlebotomy today.  He will be out of town for several weeks.  He will return for the next phlebotomy on 09/30/2017.  He will also be scheduled for phlebotomy on 10/14/2017. We will see him in follow-up on 10/28/2017.    Ned Card ANP/GNP-BC   08/12/2017  2:13 PM

## 2017-08-12 NOTE — Telephone Encounter (Signed)
Gave avs and calendar for February - April patient request 4/22

## 2017-08-13 DIAGNOSIS — Z23 Encounter for immunization: Secondary | ICD-10-CM | POA: Diagnosis not present

## 2017-08-13 DIAGNOSIS — L570 Actinic keratosis: Secondary | ICD-10-CM | POA: Diagnosis not present

## 2017-08-14 DIAGNOSIS — M1710 Unilateral primary osteoarthritis, unspecified knee: Secondary | ICD-10-CM | POA: Diagnosis not present

## 2017-08-14 DIAGNOSIS — K219 Gastro-esophageal reflux disease without esophagitis: Secondary | ICD-10-CM | POA: Diagnosis not present

## 2017-08-14 DIAGNOSIS — H9190 Unspecified hearing loss, unspecified ear: Secondary | ICD-10-CM | POA: Diagnosis not present

## 2017-08-26 ENCOUNTER — Inpatient Hospital Stay: Payer: Medicare Other

## 2017-08-26 DIAGNOSIS — H9193 Unspecified hearing loss, bilateral: Secondary | ICD-10-CM | POA: Diagnosis not present

## 2017-08-26 DIAGNOSIS — R7989 Other specified abnormal findings of blood chemistry: Secondary | ICD-10-CM | POA: Diagnosis not present

## 2017-08-26 DIAGNOSIS — R12 Heartburn: Secondary | ICD-10-CM | POA: Diagnosis not present

## 2017-08-26 NOTE — Patient Instructions (Signed)
Therapeutic Phlebotomy Discharge Instructions  - Increase your fluid intake over the next 4 hours  - No smoking for 30 minutes  - Avoid using the affected arm (the one you had the blood drawn from) for heavy lifting or other activities.  - You may resume all normal activities after 30 minutes.  You are to notify the office if you experience:   - Persistent dizziness and/or lightheadedness -Uncontrolled or excessive bleeding at the site.   Therapeutic Phlebotomy, Care After Refer to this sheet in the next few weeks. These instructions provide you with information about caring for yourself after your procedure. Your health care provider may also give you more specific instructions. Your treatment has been planned according to current medical practices, but problems sometimes occur. Call your health care provider if you have any problems or questions after your procedure. What can I expect after the procedure? After the procedure, it is common to have:  Light-headedness or dizziness. You may feel faint.  Nausea.  Tiredness.  Follow these instructions at home: Activity  Return to your normal activities as directed by your health care provider. Most people can go back to their normal activities right away.  Avoid strenuous physical activity and heavy lifting or pulling for about 5 hours after the procedure. Do not lift anything that is heavier than 10 lb (4.5 kg).  Athletes should avoid strenuous exercise for at least 12 hours.  Change positions slowly for the remainder of the day. This will help to prevent light-headedness or fainting.  If you feel light-headed, lie down until the feeling goes away. Eating and drinking  Be sure to eat well-balanced meals for the next 24 hours.  Drink enough fluid to keep your urine clear or pale yellow.  Avoid drinking alcohol on the day that you had the procedure. Care of the Needle Insertion Site  Keep your bandage dry. You can remove the  bandage after about 5 hours or as directed by your health care provider.  If you have bleeding from the needle insertion site, elevate your arm and press firmly on the site until the bleeding stops.  If you have bruising at the site, apply ice to the area: ? Put ice in a plastic bag. ? Place a towel between your skin and the bag. ? Leave the ice on for 20 minutes, 2-3 times a day for the first 24 hours.  If the swelling does not go away after 24 hours, apply a warm, moist washcloth to the area for 20 minutes, 2-3 times a day. General instructions  Avoid smoking for at least 30 minutes after the procedure.  Keep all follow-up visits as directed by your health care provider. It is important to continue with further therapeutic phlebotomy treatments as directed. Contact a health care provider if:  You have redness, swelling, or pain at the needle insertion site.  You have fluid, blood, or pus coming from the needle insertion site.  You feel light-headed, dizzy, or nauseated, and the feeling does not go away.  You notice new bruising at the needle insertion site.  You feel weaker than normal.  You have a fever or chills. Get help right away if:  You have severe nausea or vomiting.  You have chest pain.  You have trouble breathing. This information is not intended to replace advice given to you by your health care provider. Make sure you discuss any questions you have with your health care provider. Document Released: 11/26/2010 Document Revised: 02/24/2016   Document Reviewed: 06/20/2014 Elsevier Interactive Patient Education  2018 [employer]   

## 2017-08-28 DIAGNOSIS — M25562 Pain in left knee: Secondary | ICD-10-CM | POA: Diagnosis not present

## 2017-09-01 DIAGNOSIS — Z822 Family history of deafness and hearing loss: Secondary | ICD-10-CM | POA: Diagnosis not present

## 2017-09-01 DIAGNOSIS — H903 Sensorineural hearing loss, bilateral: Secondary | ICD-10-CM | POA: Diagnosis not present

## 2017-09-25 ENCOUNTER — Telehealth: Payer: Self-pay | Admitting: *Deleted

## 2017-09-25 DIAGNOSIS — M25561 Pain in right knee: Secondary | ICD-10-CM | POA: Diagnosis not present

## 2017-09-25 NOTE — Telephone Encounter (Signed)
Faxed [facility] to [email or web address], attn: Donald Prose; release id # [id number]

## 2017-09-29 DIAGNOSIS — G8918 Other acute postprocedural pain: Secondary | ICD-10-CM | POA: Diagnosis not present

## 2017-09-29 DIAGNOSIS — M11262 Other chondrocalcinosis, left knee: Secondary | ICD-10-CM | POA: Diagnosis not present

## 2017-09-29 DIAGNOSIS — M23262 Derangement of other lateral meniscus due to old tear or injury, left knee: Secondary | ICD-10-CM | POA: Diagnosis not present

## 2017-09-29 DIAGNOSIS — S83282A Other tear of lateral meniscus, current injury, left knee, initial encounter: Secondary | ICD-10-CM | POA: Diagnosis not present

## 2017-09-29 DIAGNOSIS — M94262 Chondromalacia, left knee: Secondary | ICD-10-CM | POA: Diagnosis not present

## 2017-09-29 DIAGNOSIS — S83242A Other tear of medial meniscus, current injury, left knee, initial encounter: Secondary | ICD-10-CM | POA: Diagnosis not present

## 2017-09-29 DIAGNOSIS — M23232 Derangement of other medial meniscus due to old tear or injury, left knee: Secondary | ICD-10-CM | POA: Diagnosis not present

## 2017-09-30 ENCOUNTER — Inpatient Hospital Stay: Payer: Medicare Other | Attending: Oncology

## 2017-09-30 NOTE — Patient Instructions (Signed)

## 2017-10-09 DIAGNOSIS — S83242D Other tear of medial meniscus, current injury, left knee, subsequent encounter: Secondary | ICD-10-CM | POA: Diagnosis not present

## 2017-10-09 DIAGNOSIS — S83282D Other tear of lateral meniscus, current injury, left knee, subsequent encounter: Secondary | ICD-10-CM | POA: Diagnosis not present

## 2017-10-10 DIAGNOSIS — R197 Diarrhea, unspecified: Secondary | ICD-10-CM | POA: Diagnosis not present

## 2017-10-10 DIAGNOSIS — E785 Hyperlipidemia, unspecified: Secondary | ICD-10-CM | POA: Diagnosis not present

## 2017-10-10 DIAGNOSIS — M179 Osteoarthritis of knee, unspecified: Secondary | ICD-10-CM | POA: Diagnosis not present

## 2017-10-10 DIAGNOSIS — R413 Other amnesia: Secondary | ICD-10-CM | POA: Diagnosis not present

## 2017-10-10 DIAGNOSIS — N400 Enlarged prostate without lower urinary tract symptoms: Secondary | ICD-10-CM | POA: Diagnosis not present

## 2017-10-10 DIAGNOSIS — E559 Vitamin D deficiency, unspecified: Secondary | ICD-10-CM | POA: Diagnosis not present

## 2017-10-10 DIAGNOSIS — Z1389 Encounter for screening for other disorder: Secondary | ICD-10-CM | POA: Diagnosis not present

## 2017-10-10 DIAGNOSIS — Z85828 Personal history of other malignant neoplasm of skin: Secondary | ICD-10-CM | POA: Diagnosis not present

## 2017-10-10 DIAGNOSIS — Z0000 Encounter for general adult medical examination without abnormal findings: Secondary | ICD-10-CM | POA: Diagnosis not present

## 2017-10-10 DIAGNOSIS — K219 Gastro-esophageal reflux disease without esophagitis: Secondary | ICD-10-CM | POA: Diagnosis not present

## 2017-10-10 DIAGNOSIS — N529 Male erectile dysfunction, unspecified: Secondary | ICD-10-CM | POA: Diagnosis not present

## 2017-10-14 ENCOUNTER — Inpatient Hospital Stay: Payer: Medicare Other | Attending: Oncology

## 2017-10-14 ENCOUNTER — Encounter: Payer: Self-pay | Admitting: Psychology

## 2017-10-14 ENCOUNTER — Inpatient Hospital Stay: Payer: Medicare Other

## 2017-10-14 DIAGNOSIS — N400 Enlarged prostate without lower urinary tract symptoms: Secondary | ICD-10-CM | POA: Diagnosis not present

## 2017-10-14 DIAGNOSIS — R7989 Other specified abnormal findings of blood chemistry: Secondary | ICD-10-CM | POA: Diagnosis not present

## 2017-10-14 DIAGNOSIS — H9193 Unspecified hearing loss, bilateral: Secondary | ICD-10-CM | POA: Diagnosis not present

## 2017-10-14 DIAGNOSIS — E291 Testicular hypofunction: Secondary | ICD-10-CM | POA: Diagnosis not present

## 2017-10-14 DIAGNOSIS — R413 Other amnesia: Secondary | ICD-10-CM | POA: Diagnosis not present

## 2017-10-14 DIAGNOSIS — Z79899 Other long term (current) drug therapy: Secondary | ICD-10-CM | POA: Diagnosis not present

## 2017-10-14 LAB — CBC WITH DIFFERENTIAL (CANCER CENTER ONLY)
Basophils Absolute: 0.1 10*3/uL (ref 0.0–0.1)
Basophils Relative: 1 %
Eosinophils Absolute: 0.2 10*3/uL (ref 0.0–0.5)
Eosinophils Relative: 2 %
HCT: 44.9 % (ref 38.4–49.9)
HEMOGLOBIN: 14.9 g/dL (ref 13.0–17.1)
LYMPHS ABS: 1.9 10*3/uL (ref 0.9–3.3)
LYMPHS PCT: 25 %
MCH: 32.4 pg (ref 27.2–33.4)
MCHC: 33.3 g/dL (ref 32.0–36.0)
MCV: 97.4 fL (ref 79.3–98.0)
Monocytes Absolute: 0.6 10*3/uL (ref 0.1–0.9)
Monocytes Relative: 7 %
NEUTROS PCT: 65 %
Neutro Abs: 5 10*3/uL (ref 1.5–6.5)
Platelet Count: 228 10*3/uL (ref 140–400)
RBC: 4.61 MIL/uL (ref 4.20–5.82)
RDW: 13 % (ref 11.0–14.6)
WBC: 7.6 10*3/uL (ref 4.0–10.3)

## 2017-10-14 LAB — FERRITIN: Ferritin: 57 ng/mL (ref 22–316)

## 2017-10-14 NOTE — Patient Instructions (Signed)

## 2017-10-15 ENCOUNTER — Telehealth: Payer: Self-pay | Admitting: Emergency Medicine

## 2017-10-15 DIAGNOSIS — L723 Sebaceous cyst: Secondary | ICD-10-CM | POA: Diagnosis not present

## 2017-10-15 DIAGNOSIS — L570 Actinic keratosis: Secondary | ICD-10-CM | POA: Diagnosis not present

## 2017-10-15 NOTE — Telephone Encounter (Addendum)
Pt verbalized understanding of note below.  ----- Message from Ladell Pier, MD sent at 10/15/2017  9:02 AM EDT ----- Please call patient, ferritin now close to goal range, f/u as scheduled

## 2017-10-24 ENCOUNTER — Telehealth: Payer: Self-pay | Admitting: Oncology

## 2017-10-24 NOTE — Telephone Encounter (Signed)
Patient called to verify his appointment time was still the same

## 2017-10-27 ENCOUNTER — Inpatient Hospital Stay (HOSPITAL_BASED_OUTPATIENT_CLINIC_OR_DEPARTMENT_OTHER): Payer: Medicare Other | Admitting: Oncology

## 2017-10-27 ENCOUNTER — Other Ambulatory Visit: Payer: Medicare Other

## 2017-10-27 ENCOUNTER — Inpatient Hospital Stay: Payer: Medicare Other

## 2017-10-27 ENCOUNTER — Telehealth: Payer: Self-pay | Admitting: Oncology

## 2017-10-27 DIAGNOSIS — H9193 Unspecified hearing loss, bilateral: Secondary | ICD-10-CM | POA: Diagnosis not present

## 2017-10-27 DIAGNOSIS — R7989 Other specified abnormal findings of blood chemistry: Secondary | ICD-10-CM

## 2017-10-27 NOTE — Telephone Encounter (Signed)
Scheduled appt per 4/22 los- patient to get an updated schedule in the treatment area,.

## 2017-10-27 NOTE — Patient Instructions (Signed)

## 2017-10-27 NOTE — Progress Notes (Signed)
Nicholas Mahoney presents today for phlebotomy per MD orders. Phlebotomy procedure started at 1210  and ended at1216. 545 cc removed. Patient tolerated procedure well. IV needle removed intact.  Offered drink and snack, Vital signs stable upon d/c.

## 2017-10-27 NOTE — Progress Notes (Signed)
  [facility] OFF[facility] PROGRESS NOTE   Diagnosis: Hemochromatosis  INTERVAL HISTORY:   Nicholas Mahoney turns as scheduled.  He last underwent phlebotomy 10/14/2017.  The ferritin returned at 57 on 10/14/2017.  He reports tolerating the phlebotomies well.  He recently had left knee arthroscopic surgery.  He plans to have right knee surgery in September.  Objective:  Vital signs in last 24 hours:  Blood pressure 123/64, pulse 80, temperature 97.8 F (36.6 C), temperature source Oral, resp. rate 18, height 6' (1.829 m), weight 168 lb 12.8 oz (76.6 kg), SpO2 99 %.  Resp: Lungs clear bilaterally Cardio: Regular rate and rhythm GI: No hepatosplenomegaly Vascular: No leg edema  Lab Results:  Lab Results  Component Value Date   WBC 7.6 10/14/2017   HGB 14.9 10/14/2017   HCT 44.9 10/14/2017   MCV 97.4 10/14/2017   PLT 228 10/14/2017   NEUTROABS 5.0 10/14/2017   Ferritin 1 10/14/2017: 57 Medications: I have reviewed the patient's current medications.   Assessment/Plan:  1. 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 MRI abdomen 11/05/2016 consistent with liver iron deposition ? Elevated ferritin 03/18/2017(446) ? Phlebotomy 04/10/2017, 04/17/2017, 05/13/2017, 05/20/2017, 06/05/2017, 07/15/2017, 07/29/2017, 08/13/2007, 08/26/2017, 09/30/2017, 10/14/2017  2.BPH, status post TUR  3.History of positional vertigo  4.Bilateral hearing loss    Disposition: Nicholas Mahoney appears well.  He has completed multiple phlebotomy treatments over the past 6 months.  The ferritin was approaching the goal range when he was here on 10/14/2017.  He will complete another phlebotomy treatment today.  He will return for a ferritin check on 12/03/2017.  The plan is to follow him off of phlebotomy if the ferritin returns at less than 50.  He will be scheduled for an office visit in approximately 8 months.  15 minutes were spent with the patient today.  The majority of the  time was used for counseling and coordination of care.  Betsy Coder, MD  10/27/2017  11:15 AM

## 2017-10-28 DIAGNOSIS — S83282D Other tear of lateral meniscus, current injury, left knee, subsequent encounter: Secondary | ICD-10-CM | POA: Diagnosis not present

## 2017-10-28 DIAGNOSIS — S83242D Other tear of medial meniscus, current injury, left knee, subsequent encounter: Secondary | ICD-10-CM | POA: Diagnosis not present

## 2017-11-25 DIAGNOSIS — S83242D Other tear of medial meniscus, current injury, left knee, subsequent encounter: Secondary | ICD-10-CM | POA: Diagnosis not present

## 2017-11-25 DIAGNOSIS — S83282D Other tear of lateral meniscus, current injury, left knee, subsequent encounter: Secondary | ICD-10-CM | POA: Diagnosis not present

## 2017-11-25 DIAGNOSIS — M7662 Achilles tendinitis, left leg: Secondary | ICD-10-CM | POA: Diagnosis not present

## 2017-12-02 DIAGNOSIS — Z1389 Encounter for screening for other disorder: Secondary | ICD-10-CM | POA: Diagnosis not present

## 2017-12-02 DIAGNOSIS — K219 Gastro-esophageal reflux disease without esophagitis: Secondary | ICD-10-CM | POA: Diagnosis not present

## 2017-12-02 DIAGNOSIS — Z0000 Encounter for general adult medical examination without abnormal findings: Secondary | ICD-10-CM | POA: Diagnosis not present

## 2017-12-02 DIAGNOSIS — E785 Hyperlipidemia, unspecified: Secondary | ICD-10-CM | POA: Diagnosis not present

## 2017-12-02 DIAGNOSIS — N400 Enlarged prostate without lower urinary tract symptoms: Secondary | ICD-10-CM | POA: Diagnosis not present

## 2017-12-02 DIAGNOSIS — R413 Other amnesia: Secondary | ICD-10-CM | POA: Diagnosis not present

## 2017-12-02 DIAGNOSIS — N529 Male erectile dysfunction, unspecified: Secondary | ICD-10-CM | POA: Diagnosis not present

## 2017-12-02 DIAGNOSIS — Z85828 Personal history of other malignant neoplasm of skin: Secondary | ICD-10-CM | POA: Diagnosis not present

## 2017-12-02 DIAGNOSIS — E559 Vitamin D deficiency, unspecified: Secondary | ICD-10-CM | POA: Diagnosis not present

## 2017-12-02 DIAGNOSIS — R51 Headache: Secondary | ICD-10-CM | POA: Diagnosis not present

## 2017-12-02 DIAGNOSIS — M179 Osteoarthritis of knee, unspecified: Secondary | ICD-10-CM | POA: Diagnosis not present

## 2017-12-03 ENCOUNTER — Other Ambulatory Visit: Payer: Medicare Other

## 2017-12-04 DIAGNOSIS — B999 Unspecified infectious disease: Secondary | ICD-10-CM | POA: Diagnosis not present

## 2017-12-04 DIAGNOSIS — M79672 Pain in left foot: Secondary | ICD-10-CM | POA: Diagnosis not present

## 2017-12-04 DIAGNOSIS — T560X1A Toxic effect of lead and its compounds, accidental (unintentional), initial encounter: Secondary | ICD-10-CM | POA: Diagnosis not present

## 2017-12-04 DIAGNOSIS — M109 Gout, unspecified: Secondary | ICD-10-CM | POA: Diagnosis not present

## 2017-12-08 DIAGNOSIS — M79672 Pain in left foot: Secondary | ICD-10-CM | POA: Diagnosis not present

## 2017-12-08 DIAGNOSIS — M25572 Pain in left ankle and joints of left foot: Secondary | ICD-10-CM | POA: Diagnosis not present

## 2017-12-09 ENCOUNTER — Inpatient Hospital Stay: Payer: Medicare Other | Attending: Oncology

## 2017-12-09 ENCOUNTER — Telehealth: Payer: Self-pay | Admitting: Emergency Medicine

## 2017-12-09 LAB — CBC WITH DIFFERENTIAL (CANCER CENTER ONLY)
BASOS ABS: 0 10*3/uL (ref 0.0–0.1)
BASOS PCT: 0 %
Eosinophils Absolute: 0 10*3/uL (ref 0.0–0.5)
Eosinophils Relative: 0 %
HEMATOCRIT: 44.5 % (ref 38.4–49.9)
HEMOGLOBIN: 14.7 g/dL (ref 13.0–17.1)
Lymphocytes Relative: 19 %
Lymphs Abs: 2.1 10*3/uL (ref 0.9–3.3)
MCH: 31.6 pg (ref 27.2–33.4)
MCHC: 33 g/dL (ref 32.0–36.0)
MCV: 95.7 fL (ref 79.3–98.0)
Monocytes Absolute: 0.8 10*3/uL (ref 0.1–0.9)
Monocytes Relative: 7 %
NEUTROS ABS: 8.5 10*3/uL — AB (ref 1.5–6.5)
Neutrophils Relative %: 74 %
Platelet Count: 303 10*3/uL (ref 140–400)
RBC: 4.65 MIL/uL (ref 4.20–5.82)
RDW: 12.2 % (ref 11.0–14.6)
WBC: 11.4 10*3/uL — AB (ref 4.0–10.3)

## 2017-12-09 LAB — FERRITIN: FERRITIN: 20 ng/mL — AB (ref 22–316)

## 2017-12-09 NOTE — Telephone Encounter (Addendum)
Pt verbalized understanding of this note.   ----- Message from Ladell Pier, MD sent at 12/09/2017 12:36 PM EDT ----- Please call patient, ferritin is in goal range, f/u as scheduled

## 2017-12-16 DIAGNOSIS — H52223 Regular astigmatism, bilateral: Secondary | ICD-10-CM | POA: Diagnosis not present

## 2017-12-16 DIAGNOSIS — H524 Presbyopia: Secondary | ICD-10-CM | POA: Diagnosis not present

## 2017-12-16 DIAGNOSIS — H2513 Age-related nuclear cataract, bilateral: Secondary | ICD-10-CM | POA: Diagnosis not present

## 2017-12-16 DIAGNOSIS — H5203 Hypermetropia, bilateral: Secondary | ICD-10-CM | POA: Diagnosis not present

## 2017-12-25 DIAGNOSIS — M7662 Achilles tendinitis, left leg: Secondary | ICD-10-CM | POA: Diagnosis not present

## 2018-03-11 ENCOUNTER — Inpatient Hospital Stay: Payer: Medicare Other | Attending: Oncology

## 2018-03-11 DIAGNOSIS — D485 Neoplasm of uncertain behavior of skin: Secondary | ICD-10-CM | POA: Diagnosis not present

## 2018-03-11 DIAGNOSIS — L570 Actinic keratosis: Secondary | ICD-10-CM | POA: Diagnosis not present

## 2018-03-11 LAB — FERRITIN: Ferritin: 41 ng/mL (ref 24–336)

## 2018-03-12 DIAGNOSIS — M170 Bilateral primary osteoarthritis of knee: Secondary | ICD-10-CM | POA: Diagnosis not present

## 2018-03-15 DIAGNOSIS — M25562 Pain in left knee: Secondary | ICD-10-CM | POA: Diagnosis not present

## 2018-03-16 DIAGNOSIS — Z86010 Personal history of colonic polyps: Secondary | ICD-10-CM | POA: Diagnosis not present

## 2018-03-18 DIAGNOSIS — N400 Enlarged prostate without lower urinary tract symptoms: Secondary | ICD-10-CM | POA: Diagnosis not present

## 2018-03-23 DIAGNOSIS — Z23 Encounter for immunization: Secondary | ICD-10-CM | POA: Diagnosis not present

## 2018-03-25 DIAGNOSIS — N281 Cyst of kidney, acquired: Secondary | ICD-10-CM | POA: Diagnosis not present

## 2018-03-25 DIAGNOSIS — N400 Enlarged prostate without lower urinary tract symptoms: Secondary | ICD-10-CM | POA: Diagnosis not present

## 2018-03-25 DIAGNOSIS — N5201 Erectile dysfunction due to arterial insufficiency: Secondary | ICD-10-CM | POA: Diagnosis not present

## 2018-04-02 ENCOUNTER — Ambulatory Visit: Payer: Medicare Other | Admitting: Psychology

## 2018-04-02 ENCOUNTER — Encounter: Payer: Self-pay | Admitting: Psychology

## 2018-04-02 ENCOUNTER — Ambulatory Visit (INDEPENDENT_AMBULATORY_CARE_PROVIDER_SITE_OTHER): Payer: Medicare Other | Admitting: Psychology

## 2018-04-02 DIAGNOSIS — R413 Other amnesia: Secondary | ICD-10-CM

## 2018-04-02 NOTE — Progress Notes (Signed)
   Neuropsychology Note  Nicholas Mahoney completed 60 minutes of neuropsychological testing with technician, Milana Kidney, BS, under the supervision of Dr. Macarthur Critchley, Licensed Psychologist. The patient did not appear overtly distressed by the testing session, per behavioral observation or via self-report to the technician. Rest breaks were offered.   Clinical Decision Making: In considering the patient's current level of functioning, level of presumed impairment, nature of symptoms, emotional and behavioral responses during the interview, level of literacy, and observed level of motivation/effort, a battery of tests was selected and communicated to the psychometrician.  Communication between the psychologist and technician was ongoing throughout the testing session and changes were made as deemed necessary based on patient performance on testing, technician observations and additional pertinent factors such as those listed above.  Nicholas Mahoney will return within approximately 2 weeks for an interactive feedback session with Nicholas Mahoney at which time his test performances, clinical impressions and treatment recommendations will be reviewed in detail. The patient understands he can contact our office should he require our assistance before this time.  35 minutes spent performing neuropsychological evaluation services/clinical decision making (psychologist). [CPT 53976] 60 minutes spent face-to-face with patient administering standardized tests, 30 minutes spent scoring (technician). [CPT [id number], 73419]  Full report to follow.

## 2018-04-02 NOTE — Progress Notes (Signed)
NEUROBEHAVIORAL STATUS EXAM   Name: Nicholas Mahoney Date of Birth: [birth year] Date of Interview: 04/02/2018  Reason for Referral:  Nicholas Mahoney is a 72 y.o. male who is referred for neuropsychological evaluation by Dr. [name] of [facility] due to concerns about memory change. This patient is accompanied in the office by his wife, Eulas Post, who supplements the history.  History of Presenting Problem:  Mr. Hritz reported that he has always had a hard time remembering appointment times and names of people/streets, but that it is getting more pronounced of late. Overall, he reports only mild concern about cognitive changes in recent years. Meanwhile, his wife reports observing more significant memory change in recent years, especially in the past 2 years. They both note that his hearing impairment is likely contributing to some extent. He has great difficulty processing auditory information when there is background noise, and he has great difficulty hearing higher pitched voices. He wears his hearing aids but still struggles, and this has been frustrating for him. His wife also notes that he has had more medical issues in general in the past 2-3 years and has had some difficulty coping with such changes. None of the medical issues have been major on their own, but they have caused more frustration in daily life. For example, he has much more difficulty with walking distances now due to knee problems. He was diagnosed with hereditary hemochromatosis and was having transfusions every two weeks for a while (no longer requiring them regularly for the time being). He has had prostate surgeries and knee surgeries.   Specific cognitive problems observed by his wife include: forgetting recent conversations they have had, repeating questions, difficulty focusing and concentrating, taking longer to complete tasks even small tasks, processing information more slowly,  inability to multitask, and some mild word finding difficulty as well as slightly increased misplacing/losing items. The patient continues to be quite active, he and his wife travel abroad frequently. They have been to over 70 countries and all continents. He continues to enjoy their trips but they are more difficult for him due to his hearing impairment and knee/walking issues.   The patient continues to manage all instrumental ADLs without significant difficulty. He is not getting lost or feeling uncertain about driving directions. He denies forgetting to take medications. He has not had any issues with bill payment. He does have trouble recalling appointments and frequently has to refer to his calendar or ask his wife, but he does not miss appointments. He does most of the food shopping and has not had any issues with this.   He has no known family history of dementia. His father died at age 38 (cancer) and his mother died at age 43 (cancer). He has an older brother and a younger brother, and neither of them are demonstrating any memory impairment or dementia.  He has no history of brain injury or significant concussion.  He has noticed some mild balance issues. He does not fall regularly. He has fallen twice in the past, but these were due to being on uneven/steep terrain while traveling, and he did not injure himself.   Psychiatric history was denied. He reports his current mood is stable, not depressed. He does admit to significant frustration and at times feeling isolated due to his hearing impairment. His wife notices he gets irritated much easier now if he has to shift his attention from what he is doing at the moment. There have been  no other behavioral or personality changes. He has always been a very analytical and detail oriented person. He denies suicidal ideation or intention. He has never been treated for any mental health condition. He sleeps well. He denies change in appetite. He denies  substance abuse or dependence.   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 Marital history: Married x43 years Children: Two daughters and two grandchildren Alcohol: Socially only, and never more than one drink  Tobacco: None, never a tobacco user   Medical History: Past Medical History:  Diagnosis Date  . Bladder outlet obstruction   . BPH (benign prostatic hyperplasia)   . ED (erectile dysfunction)   . Hyperlipidemia   . Lower urinary tract symptoms (LUTS)   . Migraines   . 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 OA (osteoarthritis) of knee   . Vitamin D deficiency   . Wears glasses   . Wears hearing aid    bilateral  Hereditary hemochromatosis (no liver damage; followed by hematology)    Current Medications:  Outpatient Encounter Medications as of 04/02/2018  Medication Sig  . aspirin 81 MG tablet Take 1 tablet (81 mg total) by mouth daily.  . Cholecalciferol (VITAMIN D3) 2000 UNITS TABS Take 1 tablet by mouth daily.  . Ibuprofen (ADVIL) 200 MG CAPS Take by mouth as needed.  . Probiotic Product (PROBIOTIC DAILY PO) Take by mouth.  . simvastatin (ZOCOR) 20 MG tablet every evening.  . zolpidem (AMBIEN) 10 MG tablet 1 TABLET AT BEDTIME AS NEEDED FOR SLEEP ORALLY   No facility-administered encounter medications on file as of 04/02/2018.    He only takes Ambien when traveling across time zones. He does not take it nightly.   Behavioral Observations:   Appearance: Neatly and appropriately dressed and groomed Gait: Ambulated independently, no gross abnormalities observed Speech: Fluent; normal rate, rhythm and volume. No significant word finding difficulty observed in conversational speech. Thought process: Linear, goal directed Affect: Blunted, euthymic Interpersonal: Pleasant, appropriate   60 minutes spent face-to-face with patient  completing neurobehavioral status exam. 35 minutes spent integrating medical records/clinical data and completing this report. CPT [id number] unit; [id number] unit.   TESTING: There is medical necessity to proceed with neuropsychological assessment as the results will be used to aid in differential diagnosis and clinical decision-making and to inform specific treatment recommendations. Per the patient, his wife and medical records reviewed, there has been a change in cognitive functioning and a reasonable suspicion of neurocognitive disorder.  Clinical Decision Making: In considering the patient's current level of functioning, level of presumed impairment, nature of symptoms, emotional and behavioral responses during the interview, level of literacy, and observed level of motivation, a battery of tests was selected and communicated to the psychometrician.   Following the clinical interview/neurobehavioral status exam, the patient completed this full battery of neuropsychological testing with my psychometrician under my supervision (see separate note).   PLAN: The patient will return on 04/27/2018 to see me for a follow-up session at which time his test performances and my impressions and treatment recommendations will be reviewed in detail. (He will be out of the country 10/1 to 10/17.) Evaluation ongoing; full report to follow.

## 2018-04-09 ENCOUNTER — Encounter: Payer: Medicare Other | Admitting: Psychology

## 2018-04-26 NOTE — Progress Notes (Signed)
NEUROPSYCHOLOGICAL EVALUATION   Name:    Nicholas Mahoney  Date of Birth:   11-04-1945 Date of Interview:  04/02/2018 Date of Testing:  04/02/2018   Date of Feedback:  04/27/2018       Background Information:  Reason for Referral:  Nicholas Mahoney is a 72 y.o. male referred by Dr. [name] of [facility] to assess his current level of cognitive functioning and assist in differential diagnosis. The current evaluation consisted of a review of available medical records, an interview with the patient and his wife, and the completion of a neuropsychological testing battery. Informed consent was obtained.  History of Presenting Problem:  Nicholas Mahoney reported that he has always had a hard time remembering appointment times and names of people/streets, but that it is getting more pronounced of late. Overall, he reports only mild concern about cognitive changes in recent years. Meanwhile, his wife reports observing more significant memory change in recent years, especially in the past 2 years. They both note that his hearing impairment is likely contributing to some extent. He has great difficulty processing auditory information when there is background noise, and he has great difficulty hearing higher pitched voices. He wears his hearing aids but still struggles, and this has been frustrating for him. His wife also notes that he has had more medical issues in general in the past 2-3 years and has had some difficulty coping with such changes. None of the medical issues have been major on their own, but they have caused more frustration in daily life. For example, he has much more difficulty with walking distances now due to knee problems. He was diagnosed with hereditary hemochromatosis and was having transfusions every two weeks for a while (no longer requiring them regularly for the time being). He has had prostate surgeries and knee surgeries.   Specific cognitive  problems observed by his wife include: forgetting recent conversations they have had, repeating questions, difficulty focusing and concentrating, taking longer to complete tasks even small tasks, processing information more slowly, inability to multitask, and some mild word finding difficulty as well as slightly increased misplacing/losing items. The patient continues to be quite active, he and his wife travel abroad frequently. They have been to over 70 countries and all continents. He continues to enjoy their trips but they are more difficult for him due to his hearing impairment and knee/walking issues.   The patient continues to manage all instrumental ADLs without significant difficulty. He is not getting lost or feeling uncertain about driving directions. He denies forgetting to take medications. He has not had any issues with bill payment. He does have trouble recalling appointments and frequently has to refer to his calendar or ask his wife, but he does not miss appointments. He does most of the food shopping and has not had any issues with this.   He has no known family history of dementia. His father died at age 53 (cancer) and his mother died at age 30 (cancer). He has an older brother and a younger brother, and neither of them are demonstrating any memory impairment or dementia.  He has no history of brain injury or significant concussion.  He has noticed some mild balance issues. He does not fall regularly. He has fallen twice in the past, but these were due to being on uneven/steep terrain while traveling, and he did not injure himself.   Psychiatric history was denied. He reports his current mood is stable, not  depressed. He does admit to significant frustration and at times feeling isolated due to his hearing impairment. His wife notices he gets irritated much easier now if he has to shift his attention from what he is doing at the moment. There have been no other behavioral or  personality changes. He has always been a very analytical and detail oriented person. He denies suicidal ideation or intention. He has never been treated for any mental health condition. He sleeps well. He denies change in appetite. He denies substance abuse or dependence.   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 Marital history: Married x43 years Children: Two daughters and two grandchildren Alcohol: Socially only, and never more than one drink  Tobacco: None, never a tobacco user   Medical History:  Past Medical History:  Diagnosis Date  . Bladder outlet obstruction   . BPH (benign prostatic hyperplasia)   . ED (erectile dysfunction)   . Hyperlipidemia   . Lower urinary tract symptoms (LUTS)   . Migraines   . 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 OA (osteoarthritis) of knee   . Vitamin D deficiency   . Wears glasses   . Wears hearing aid    bilateral    Current medications:  Outpatient Encounter Medications as of 04/27/2018  Medication Sig  . aspirin 81 MG tablet Take 1 tablet (81 mg total) by mouth daily.  . Cholecalciferol (VITAMIN D3) 2000 UNITS TABS Take 1 tablet by mouth daily.  . Ibuprofen (ADVIL) 200 MG CAPS Take by mouth as needed.  . Probiotic Product (PROBIOTIC DAILY PO) Take by mouth.  . simvastatin (ZOCOR) 20 MG tablet every evening.  . zolpidem (AMBIEN) 10 MG tablet 1 TABLET AT BEDTIME AS NEEDED FOR SLEEP ORALLY   No facility-administered encounter medications on file as of 04/27/2018.    He only takes Ambien when traveling across time zones. He does not take it nightly.   Current Examination:  Behavioral Observations:  Appearance: Neatly and appropriately dressed and groomed Gait: Ambulated independently, no gross abnormalities observed Speech: Fluent; normal rate, rhythm and volume. No significant word finding  difficulty observed in conversational speech. Thought process: Linear, goal directed Affect: Blunted, euthymic Interpersonal: Pleasant, appropriate Orientation: Oriented to all spheres. Accurately named the current President and his predecessor.   Tests Administered: . Test of Premorbid Functioning (TOPF) . Wechsler Adult Intelligence Scale-Fourth Edition (WAIS-IV): Similarities, Music therapist, Coding and Digit Span subtests . Wechsler Memory Scale-Fourth Edition (WMS-IV) Older Adult Version (ages 38-90): Logical Memory I, II and Recognition subtests  . [employer] Verbal Learning Test - 2nd Edition (CVLT-2) Short Form . Repeatable Battery for the Assessment of Neuropsychological Status (RBANS) Form A:  Figure Copy and Recall subtests and Semantic Fluency subtest . Boston Naming Test (BNT) . Boston Diagnostic Aphasia Examination: Complex Ideational Material subtest . Controlled Oral Word Association Test (COWAT) . Trail Making Test A and B . Clock drawing test . Beck Depression Inventory - 2nd Edition (BDI-II) . Generalized Anxiety Disorder - 7 item screener (GAD-7)  Test Results: Note: Standardized scores are presented only for use by appropriately trained professionals and to allow for any future test-retest comparison. These scores should not be interpreted without consideration of all the information that is contained in the rest of the report. The most recent standardization samples from the test publisher or other sources were used  whenever possible to derive standard scores; scores were corrected for age, gender, ethnicity and education when available.   Test Scores:  Test Name Raw Score Standardized Score Descriptor  TOPF 65/70 SS= 123 Superior  WAIS-IV Subtests     Similarities 28/36 ss= 12 High average  Block Design 48/66 ss= 15 Superior  Coding 42/135 ss= 8 Low end of average  Digit Span Forward 15/16 ss= 17 Very superior  Digit Span Backward 11/16 ss= 14 Superior  WMS-IV  Subtests     LM I 44/53 ss= 16 Very superior  LM II 26/39 ss= 13 High average  LM II Recognition 23/23 Cum %: >75 Above average  RBANS Subtests     Figure Copy 20/20 Z= 1.2 High average  Figure Recall 6/20 Z= -1.6 Borderline  Semantic Fluency 15 Z= -0.9 Low average  CVLT-II Scores     Trial 1 6/9 Z= 0.5 Average  Trial 4 7/9 Z= 0 Average  Trials 1-4 total 27/36 T= 58 High average  SD Free Recall 5/9 Z= -1 Low average  LD Free Recall 4/9 Z= -0.5 Average  LD Cued Recall 7/9 Z= 1 High average  Recognition Discriminability 8/9 hits 0 false positives Z= 1 High average  Forced Choice Recognition 8/9  Below expectation  BNT 59/60 T= 58 High average  BDAE Complex Ideational Material 12/12  WNL  COWAT-FAS 35 T= 44 Average  COWAT-Animals 24 T= 64 Superior  Trail Making Test A  42" 1 error T= 50 Average  Trail Making Test B  67" 0 errors T= 56 Average  Clock Drawing   WNL  BDI-II 8/63  WNL  GAD-7 1/21  WNL      Description of Test Results:  Premorbid verbal intellectual abilities were estimated to have been within the superior range based on a test of word reading.   Psychomotor processing speed was average.   Auditory attention and working memory were very superior to superior.   Visual-spatial construction was superior.   Language abilities were largely within normal limits. Specifically, confrontation naming was high average, and semantic verbal fluency ranged from low average (for fruits/vegetables) to superior (for animals). Auditory comprehension of complex ideational material was intact.   With regard to verbal memory, encoding and acquisition of non-contextual information (i.e., word list) was high average. After a brief distracter task, free recall was low average (5/9 items). After a delay, free recall was average (4/9 items). Cued recall was high average (7/9 items). Performance on a yes/no recognition task was high average. On another verbal memory test, encoding and  acquisition of contextual auditory information (i.e., short stories) was very superior. After a delay, free recall was high average. Performance on a yes/no recognition task was above average with 100% accuracy. With regard to non-verbal memory, delayed free recall of visual information was borderline impaired.   Executive functioning was within normal limits for age. Mental flexibility and set-shifting were average on Trails B. Verbal fluency with phonemic search restrictions was average. Verbal abstract reasoning was high average. Performance on a clock drawing task was normal.   On a self-report measure of mood, the patient's responses were not indicative of clinically significant depression at the present time. Symptoms endorsed included: mild anhedonia, indecisiveness, worthlessness, loss of energy, irritability, concentration difficulty, fatigue and reduced libido. He denied suicidal ideation or intention. On a self-report measure of anxiety, the patient did not endorse any generalized anxiety at the present time.    Clinical Impressions: Diagnosis deferred. Results of cognitive  testing were within normal limits, with many areas of function in the high average to very superior range, consistent with estimated premorbid intellectual baseline. He demonstrated borderline Impairment on one task only, a task of visual recall. Based on history and overall test performances, I don't suspect this single atypical score is of clinical significance.  Current test results are NOT consistent with MCI, dementia or Alzheimer's disease at this point, and in fact he did quite well on tests of auditory memory and confrontation naming (two areas that can be reduced in prodromal AD).  I suspect hearing loss is the major issue affecting processing and memory of auditory information in his daily life. His hearing loss and other medical changes, in turn, are contributing to increased frustration and adjustment reaction. He  does not appear to be experiencing a depressive episode or primary psychiatric disorder, but stress/frustration may be negatively affecting cognition in daily life as well.     Recommendations/Plan: Based on the findings of the present evaluation, the following recommendations are offered:  1. Due to significant hearing loss, his ability to process and in turn encode and consolidate auditory information, is compromised. In order to increase the chance of encoding and consolidation of auditory information, full attention will be required and he should repeat information back in order to make sure it was interpreted properly. He will also benefit from visual cues (e.g., written notes). He will do best in one-on-one conversations with minimal background noise. He may benefit from assistive devices/technology to assist (e.g. https://www.hearingloss.org/hearing-help/technology/hat/). He is currently working with a hearing specialist to try various products.  2. Neurocognitive re-evaluation in 1-2 years is recommended as his wife still has concerns that he has demonstrated cognitive decline aside from what can be explained by hearing impairment.    Feedback to Patient: Nicholas Mahoney and his wife returned for a feedback appointment on 04/27/2018 to review the results of his neuropsychological evaluation with this provider. 35 minutes face-to-face time was spent reviewing his test results, my impressions and my recommendations as detailed above.    Total time spent on this patient's case: 95 minutes for neurobehavioral status exam with psychologist (CPT code [phone], [phone] unit); 90 minutes of testing/scoring by psychometrician under psychologist's supervision (CPT codes [phone], [phone] units); 180 minutes for integration of patient data, interpretation of standardized test results and clinical data, clinical decision making, treatment planning and preparation of this report, and interactive feedback  with review of results to the patient/family by psychologist (CPT codes [phone], [phone] units).      Thank you for your referral of Nicholas Mahoney. Please feel free to contact me if you have any questions or concerns regarding this report.

## 2018-04-27 ENCOUNTER — Ambulatory Visit (INDEPENDENT_AMBULATORY_CARE_PROVIDER_SITE_OTHER): Payer: Medicare Other | Admitting: Psychology

## 2018-04-27 ENCOUNTER — Encounter: Payer: Self-pay | Admitting: Psychology

## 2018-04-27 DIAGNOSIS — R413 Other amnesia: Secondary | ICD-10-CM | POA: Diagnosis not present

## 2018-05-28 DIAGNOSIS — M1712 Unilateral primary osteoarthritis, left knee: Secondary | ICD-10-CM | POA: Diagnosis not present

## 2018-05-28 DIAGNOSIS — M25552 Pain in left hip: Secondary | ICD-10-CM | POA: Diagnosis not present

## 2018-06-09 ENCOUNTER — Telehealth: Payer: Self-pay | Admitting: Oncology

## 2018-06-09 DIAGNOSIS — L814 Other melanin hyperpigmentation: Secondary | ICD-10-CM | POA: Diagnosis not present

## 2018-06-09 DIAGNOSIS — Z85828 Personal history of other malignant neoplasm of skin: Secondary | ICD-10-CM | POA: Diagnosis not present

## 2018-06-09 DIAGNOSIS — L821 Other seborrheic keratosis: Secondary | ICD-10-CM | POA: Diagnosis not present

## 2018-06-09 DIAGNOSIS — L570 Actinic keratosis: Secondary | ICD-10-CM | POA: Diagnosis not present

## 2018-06-09 DIAGNOSIS — Z23 Encounter for immunization: Secondary | ICD-10-CM | POA: Diagnosis not present

## 2018-06-09 DIAGNOSIS — M1712 Unilateral primary osteoarthritis, left knee: Secondary | ICD-10-CM | POA: Diagnosis not present

## 2018-06-09 DIAGNOSIS — D225 Melanocytic nevi of trunk: Secondary | ICD-10-CM | POA: Diagnosis not present

## 2018-06-09 NOTE — Telephone Encounter (Signed)
Rescheduled patient lab appointment from 12/5 to 12/4 per patient request.

## 2018-06-10 ENCOUNTER — Inpatient Hospital Stay: Payer: Medicare Other | Attending: Oncology

## 2018-06-10 DIAGNOSIS — M25562 Pain in left knee: Secondary | ICD-10-CM | POA: Insufficient documentation

## 2018-06-10 DIAGNOSIS — H9193 Unspecified hearing loss, bilateral: Secondary | ICD-10-CM | POA: Insufficient documentation

## 2018-06-10 LAB — FERRITIN: Ferritin: 41 ng/mL (ref 24–336)

## 2018-06-11 ENCOUNTER — Telehealth: Payer: Self-pay

## 2018-06-11 ENCOUNTER — Inpatient Hospital Stay: Payer: Medicare Other

## 2018-06-11 ENCOUNTER — Inpatient Hospital Stay (HOSPITAL_BASED_OUTPATIENT_CLINIC_OR_DEPARTMENT_OTHER): Payer: Medicare Other | Admitting: Oncology

## 2018-06-11 DIAGNOSIS — H9193 Unspecified hearing loss, bilateral: Secondary | ICD-10-CM | POA: Diagnosis not present

## 2018-06-11 DIAGNOSIS — M25562 Pain in left knee: Secondary | ICD-10-CM | POA: Diagnosis not present

## 2018-06-11 NOTE — Telephone Encounter (Signed)
Printed avs and calender of upcoming appointment. Per 12/5 los 

## 2018-06-11 NOTE — Progress Notes (Signed)
  [facility] OFFICE PROGRESS NOTE   Diagnosis: Hemochromatosis  INTERVAL HISTORY:   Nicholas Mahoney returns for a scheduled visit.  He feels well.  He reports pain in left knee despite surgery earlier this year.  He last underwent phlebotomy in April.  Objective:  Vital signs in last 24 hours:  Blood pressure 133/68, pulse 71, temperature (!) 97.5 F (36.4 C), temperature source Oral, resp. rate 18, height 6' (1.829 m), weight 172 lb 14.4 oz (78.4 kg), SpO2 97 %.   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   Assessment/Plan: 1. 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 MRI abdomen 11/05/2016 consistent with liver iron deposition ? Elevated ferritin 03/18/2017(446) ? Phlebotomy 04/10/2017, 04/17/2017, 05/13/2017, 05/20/2017, 06/05/2017,07/15/2017, 07/29/2017,08/13/2007, 08/26/2017, 09/30/2017, 10/14/2017, 10/27/2017  2.BPH, status post TUR  3.History of positional vertigo  4.Bilateral hearing loss    Disposition: Nicholas Mahoney appears stable.  The ferritin is in goal range after a series of phlebotomy therapies over the past year.  He will return for a ferritin level in 3 months in 6 months.  He will be scheduled for a 16-month office visit.  15 minutes were spent with the patient today.  The majority of the time was used for counseling and coordination of care.  Betsy Coder, MD  06/11/2018  12:30 PM

## 2018-06-12 DIAGNOSIS — M6281 Muscle weakness (generalized): Secondary | ICD-10-CM | POA: Diagnosis not present

## 2018-06-12 DIAGNOSIS — M1712 Unilateral primary osteoarthritis, left knee: Secondary | ICD-10-CM | POA: Diagnosis not present

## 2018-06-12 DIAGNOSIS — M25662 Stiffness of left knee, not elsewhere classified: Secondary | ICD-10-CM | POA: Diagnosis not present

## 2018-06-12 DIAGNOSIS — M25562 Pain in left knee: Secondary | ICD-10-CM | POA: Diagnosis not present

## 2018-06-16 DIAGNOSIS — M1712 Unilateral primary osteoarthritis, left knee: Secondary | ICD-10-CM | POA: Diagnosis not present

## 2018-06-17 DIAGNOSIS — M1712 Unilateral primary osteoarthritis, left knee: Secondary | ICD-10-CM | POA: Diagnosis not present

## 2018-06-17 DIAGNOSIS — M25562 Pain in left knee: Secondary | ICD-10-CM | POA: Diagnosis not present

## 2018-06-17 DIAGNOSIS — M6281 Muscle weakness (generalized): Secondary | ICD-10-CM | POA: Diagnosis not present

## 2018-06-17 DIAGNOSIS — M25662 Stiffness of left knee, not elsewhere classified: Secondary | ICD-10-CM | POA: Diagnosis not present

## 2018-06-19 DIAGNOSIS — M1712 Unilateral primary osteoarthritis, left knee: Secondary | ICD-10-CM | POA: Diagnosis not present

## 2018-06-19 DIAGNOSIS — M6281 Muscle weakness (generalized): Secondary | ICD-10-CM | POA: Diagnosis not present

## 2018-06-19 DIAGNOSIS — M25662 Stiffness of left knee, not elsewhere classified: Secondary | ICD-10-CM | POA: Diagnosis not present

## 2018-06-19 DIAGNOSIS — M25562 Pain in left knee: Secondary | ICD-10-CM | POA: Diagnosis not present

## 2018-06-22 DIAGNOSIS — M25662 Stiffness of left knee, not elsewhere classified: Secondary | ICD-10-CM | POA: Diagnosis not present

## 2018-06-22 DIAGNOSIS — M6281 Muscle weakness (generalized): Secondary | ICD-10-CM | POA: Diagnosis not present

## 2018-06-22 DIAGNOSIS — M1712 Unilateral primary osteoarthritis, left knee: Secondary | ICD-10-CM | POA: Diagnosis not present

## 2018-06-22 DIAGNOSIS — M25562 Pain in left knee: Secondary | ICD-10-CM | POA: Diagnosis not present

## 2018-06-24 DIAGNOSIS — M25562 Pain in left knee: Secondary | ICD-10-CM | POA: Diagnosis not present

## 2018-06-24 DIAGNOSIS — M1712 Unilateral primary osteoarthritis, left knee: Secondary | ICD-10-CM | POA: Diagnosis not present

## 2018-06-24 DIAGNOSIS — M25662 Stiffness of left knee, not elsewhere classified: Secondary | ICD-10-CM | POA: Diagnosis not present

## 2018-06-24 DIAGNOSIS — M6281 Muscle weakness (generalized): Secondary | ICD-10-CM | POA: Diagnosis not present

## 2018-07-10 DIAGNOSIS — M1712 Unilateral primary osteoarthritis, left knee: Secondary | ICD-10-CM | POA: Diagnosis not present

## 2018-07-10 DIAGNOSIS — M6281 Muscle weakness (generalized): Secondary | ICD-10-CM | POA: Diagnosis not present

## 2018-07-10 DIAGNOSIS — M25562 Pain in left knee: Secondary | ICD-10-CM | POA: Diagnosis not present

## 2018-07-10 DIAGNOSIS — M25662 Stiffness of left knee, not elsewhere classified: Secondary | ICD-10-CM | POA: Diagnosis not present

## 2018-07-31 DIAGNOSIS — M25662 Stiffness of left knee, not elsewhere classified: Secondary | ICD-10-CM | POA: Diagnosis not present

## 2018-07-31 DIAGNOSIS — M1712 Unilateral primary osteoarthritis, left knee: Secondary | ICD-10-CM | POA: Diagnosis not present

## 2018-07-31 DIAGNOSIS — M25562 Pain in left knee: Secondary | ICD-10-CM | POA: Diagnosis not present

## 2018-07-31 DIAGNOSIS — M6281 Muscle weakness (generalized): Secondary | ICD-10-CM | POA: Diagnosis not present

## 2018-08-05 DIAGNOSIS — M25562 Pain in left knee: Secondary | ICD-10-CM | POA: Diagnosis not present

## 2018-08-05 DIAGNOSIS — M1712 Unilateral primary osteoarthritis, left knee: Secondary | ICD-10-CM | POA: Diagnosis not present

## 2018-08-05 DIAGNOSIS — M25662 Stiffness of left knee, not elsewhere classified: Secondary | ICD-10-CM | POA: Diagnosis not present

## 2018-08-05 DIAGNOSIS — M6281 Muscle weakness (generalized): Secondary | ICD-10-CM | POA: Diagnosis not present

## 2018-08-07 DIAGNOSIS — M6281 Muscle weakness (generalized): Secondary | ICD-10-CM | POA: Diagnosis not present

## 2018-08-07 DIAGNOSIS — M25662 Stiffness of left knee, not elsewhere classified: Secondary | ICD-10-CM | POA: Diagnosis not present

## 2018-08-07 DIAGNOSIS — M1712 Unilateral primary osteoarthritis, left knee: Secondary | ICD-10-CM | POA: Diagnosis not present

## 2018-08-07 DIAGNOSIS — M25562 Pain in left knee: Secondary | ICD-10-CM | POA: Diagnosis not present

## 2018-08-10 DIAGNOSIS — M6281 Muscle weakness (generalized): Secondary | ICD-10-CM | POA: Diagnosis not present

## 2018-08-10 DIAGNOSIS — M1712 Unilateral primary osteoarthritis, left knee: Secondary | ICD-10-CM | POA: Diagnosis not present

## 2018-08-10 DIAGNOSIS — M25562 Pain in left knee: Secondary | ICD-10-CM | POA: Diagnosis not present

## 2018-08-10 DIAGNOSIS — M25662 Stiffness of left knee, not elsewhere classified: Secondary | ICD-10-CM | POA: Diagnosis not present

## 2018-08-12 DIAGNOSIS — M25662 Stiffness of left knee, not elsewhere classified: Secondary | ICD-10-CM | POA: Diagnosis not present

## 2018-08-12 DIAGNOSIS — M25562 Pain in left knee: Secondary | ICD-10-CM | POA: Diagnosis not present

## 2018-08-12 DIAGNOSIS — M1712 Unilateral primary osteoarthritis, left knee: Secondary | ICD-10-CM | POA: Diagnosis not present

## 2018-08-12 DIAGNOSIS — M6281 Muscle weakness (generalized): Secondary | ICD-10-CM | POA: Diagnosis not present

## 2018-08-13 DIAGNOSIS — M1712 Unilateral primary osteoarthritis, left knee: Secondary | ICD-10-CM | POA: Diagnosis not present

## 2018-08-20 ENCOUNTER — Ambulatory Visit (INDEPENDENT_AMBULATORY_CARE_PROVIDER_SITE_OTHER): Payer: Medicare Other | Admitting: Internal Medicine

## 2018-08-20 ENCOUNTER — Encounter: Payer: Self-pay | Admitting: Internal Medicine

## 2018-08-20 VITALS — BP 120/66 | HR 70 | Temp 97.9°F | Ht 73.0 in | Wt 176.6 lb

## 2018-08-20 DIAGNOSIS — E291 Testicular hypofunction: Secondary | ICD-10-CM

## 2018-08-20 DIAGNOSIS — Z79899 Other long term (current) drug therapy: Secondary | ICD-10-CM

## 2018-08-20 DIAGNOSIS — M170 Bilateral primary osteoarthritis of knee: Secondary | ICD-10-CM

## 2018-08-20 NOTE — Patient Instructions (Signed)
Testosterone Replacement Therapy  Testosterone replacement therapy (TRT) is used to treat men who have a low testosterone level (hypogonadism). Testosterone is a male hormone that is produced in the testicles. It is responsible for typically male characteristics and for maintaining a man's sex drive and the ability to get an erection. Testosterone also supports bone and muscle health. TRT can be a gel, liquid, or patch that you put on your skin. It can also be in the form of a tablet or an injection. In some cases, your health care provider may insert long-acting pellets under your skin. In most men, the level of testosterone starts to decline gradually after age 58. Low testosterone can also be caused by certain medical conditions, medicines, and obesity. Your health care provider can diagnose hypogonadism with at least two blood tests that are done early in the morning. Low testosterone may not need to be treated. TRT is usually a choice that you make with your health care provider. Your health care provider may recommend TRT if you have low testosterone that is causing symptoms, such as:  Low sex drive.  Erection problems.  Breast enlargement.  Loss of body hair.  Weak muscles or bones.  Shrinking testicles.  Increased body fat.  Low energy.  Hot flashes.  Depression.  Decreased work Systems analyst. TRT is a lifetime treatment. If you stop treatment, your testosterone will drop, and your symptoms may return. What are the risks? Testosterone replacement therapy may have side effects, including:  Lower sperm count.  Skin irritation at the application or injection site.  Mouth irritation if you take an oral tablet.  Acne.  Swelling of your legs or feet.  Tender breasts.  Dizziness.  Sleep disturbance.  Mood swings.  Possible increased risk of stroke or heart attack. Testosterone replacement therapy may also increase your risk for prostate cancer or male breast cancer.  You should not use TRT if you have either of those conditions. Your health care provider also may not recommend TRT if:  You are suspected of having prostate cancer.  You want to father a child.  You have a high number of red blood cells.  You have untreated sleep apnea.  You have a very large prostate. Supplies needed:  Your health care provider will prescribe the testosterone gel, solution, or medicine that you need. If your health care provider teaches you to do self-injections at home, you will also need: ? Your medicine vial. ? Disposable needles and syringes. ? Alcohol swabs. ? A needle disposal container. ? Adhesive bandages. How to use testosterone replacement therapy Your health care provider will help you find the TRT option that will work best for you based on your preference, the side effects, and the cost. You may:  Rub testosterone gel on your upper arm or shoulder every day after a shower. This is the most common type of TRT. Do not let women or children come in contact with the gel.  Apply a testosterone solution under your arms once each day.  Place a testosterone patch on your skin once each day.  Dissolve a testosterone tablet in your mouth twice each day.  Have a testosterone pellet inserted under your skin by your health care provider. This will be replaced every 3-6 months.  Use testosterone nasal spray three times each day.  Get testosterone injections. For some types of testosterone, your health care provider will give you this injection. With other types of testosterone, you may be taught to give injections to  provider. This will be replaced every 3-6 months.  · Use testosterone nasal spray three times each day.  · Get testosterone injections. For some types of testosterone, your health care provider will give you this injection. With other types of testosterone, you may be taught to give injections to yourself. The frequency of injections may vary based on the type of testosterone that you receive.  Follow these instructions at home:  · Take over-the-counter and prescription medicines only as told by your health care provider.  · Lose weight if you are overweight. Ask your health care provider to help you start a healthy diet and exercise program to  reach and maintain a healthy weight.  · Work with your health care provider to treat other medical conditions that may lower your testosterone. These include obesity, high blood pressure, high cholesterol, diabetes, liver disease, kidney disease, and sleep apnea.  · Keep all follow-up visits as told by your health care provider. This is important.  General recommendations  · Discuss all risks and benefits with your health care provider before starting therapy.  · Work with your health care provider to check your prostate health and do blood testing before you start therapy.  · Do not use any testosterone replacement therapies that are not prescribed by your health care provider or not approved for use in the [place]  · Do not use TRT for bodybuilding or to improve sexual performance. TRT should be used only to treat symptoms of low testosterone.  · Return for all repeat prostate checks and blood tests during therapy, as told by your health care provider.  Where to find more information  Learn more about testosterone replacement therapy from:  · American [employer]: www.urologyhealth.org/urologic-conditions/low-testosterone-(hypogonadism)  · Endocrine Society: www.hormone.org/diseases-and-conditions/mens-health/hypogonadism  Contact a health care provider if:  · You have side effects from your testosterone replacement therapy.  · You continue to have symptoms of low testosterone during treatment.  · You develop new symptoms during treatment.  Summary  · Testosterone replacement therapy is only for men who have low testosterone as determined by blood testing and who have symptoms of low testosterone.  · Testosterone replacement therapy should be prescribed only by a health care provider and should be used under the supervision of a health care provider.  · You may not be able to take testosterone if you have certain medical conditions, including prostate cancer, male breast cancer, or heart  disease.  · Testosterone replacement therapy may have side effects and may make some medical conditions worse.  · Talk with your health care provider about all the risks and benefits before you start therapy.  This information is not intended to replace advice given to you by your health care provider. Make sure you discuss any questions you have with your health care provider.  Document Released: 03/14/2016 Document Revised: 03/14/2016 Document Reviewed: 03/14/2016  Elsevier Interactive Patient Education © 2019 [employer] Inc.

## 2018-08-21 LAB — HEPATIC FUNCTION PANEL
ALT: 29 IU/L (ref 0–44)
AST: 38 IU/L (ref 0–40)
Albumin: 4.4 g/dL (ref 3.7–4.7)
Alkaline Phosphatase: 72 IU/L (ref 39–117)
Bilirubin Total: 0.5 mg/dL (ref 0.0–1.2)
Bilirubin, Direct: 0.14 mg/dL (ref 0.00–0.40)
Total Protein: 6.4 g/dL (ref 6.0–8.5)

## 2018-08-21 LAB — CBC
Hematocrit: 44.8 % (ref 37.5–51.0)
Hemoglobin: 15.6 g/dL (ref 13.0–17.7)
MCH: 32.7 pg (ref 26.6–33.0)
MCHC: 34.8 g/dL (ref 31.5–35.7)
MCV: 94 fL (ref 79–97)
Platelets: 206 10*3/uL (ref 150–450)
RBC: 4.77 x10E6/uL (ref 4.14–5.80)
RDW: 12.3 % (ref 11.6–15.4)
WBC: 5.6 10*3/uL (ref 3.4–10.8)

## 2018-08-21 LAB — TESTOSTERONE: Testosterone: 378 ng/dL (ref 264–916)

## 2018-08-22 NOTE — Progress Notes (Signed)
Subjective:     Patient ID: Nicholas Mahoney , male    DOB: 22-Oct-1945 , 73 y.o.   MRN: [id number]   Chief Complaint  Patient presents with  . hormones f/u    HPI  He is here today for f/u BHRT.  He feels well on his current topical testosterone regimen. He has no specific concerns at this time.     Past Medical History:  Diagnosis Date  . Bladder outlet obstruction   . BPH (benign prostatic hyperplasia)   . ED (erectile dysfunction)   . Hyperlipidemia   . Lower urinary tract symptoms (LUTS)   . Migraines   . 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 OA (osteoarthritis) of knee   . Vitamin D deficiency   . Wears glasses   . Wears hearing aid    bilateral     Family History  Problem Relation Age of Onset  . Cancer Mother   . Diabetes Mother   . Coronary artery disease Mother   . Cancer Father   . Heart attack Father   . Coronary artery disease Brother   . Diabetes Brother      Current Outpatient Medications:  .  aspirin 81 MG tablet, Take 1 tablet (81 mg total) by mouth daily., Disp: 30 tablet, Rfl:  .  Cholecalciferol (VITAMIN D3) 2000 UNITS TABS, Take 1 tablet by mouth daily., Disp: , Rfl:  .  diclofenac (VOLTAREN) 75 MG EC tablet, TAKE 1 TABLET BY MOUTH TWICE A DAY AFTER MEALS FOR INFLAMMATION, PAIN, OR SWELLING, Disp: , Rfl:  .  fluorouracil (EFUDEX) 5 % cream, , Disp: , Rfl:  .  Ibuprofen (ADVIL) 200 MG CAPS, Take by mouth as needed., Disp: , Rfl:  .  simvastatin (ZOCOR) 20 MG tablet, every evening., Disp: , Rfl: 1 .  zolpidem (AMBIEN) 10 MG tablet, 1 TABLET AT BEDTIME AS NEEDED FOR SLEEP ORALLY, Disp: , Rfl: 0   No Known Allergies   Review of Systems  Constitutional: Negative.   Respiratory: Negative.   Cardiovascular: Negative.   Gastrointestinal: Negative.   Musculoskeletal: Positive for arthralgias (he c/o knee pain. has h/o OA. has had hyaluronic injections b/l - this has helped a great deal. ).  Neurological: Negative.    Psychiatric/Behavioral: Negative.      Today's Vitals   08/20/18 1119  BP: 120/66  Pulse: 70  Temp: 97.9 F (36.6 C)  TempSrc: Oral  Weight: 176 lb 9.6 oz (80.1 kg)  Height: 6\' 1"  (1.854 m)  PainSc: 1    Body mass index is 23.3 kg/m.   Objective:  Physical Exam Vitals signs and nursing note reviewed.  Constitutional:      Appearance: Normal appearance.  HENT:     Head: Normocephalic and atraumatic.  Cardiovascular:     Rate and Rhythm: Normal rate and regular rhythm.     Heart sounds: Normal heart sounds.  Pulmonary:     Effort: Pulmonary effort is normal.     Breath sounds: Normal breath sounds.  Skin:    General: Skin is warm.  Neurological:     General: No focal deficit present.     Mental Status: He is alert.         Assessment And Plan:     1. Hypogonadism male  He will continue with his current regimen. I will check labs as listed below.  He will rto in four months for re-evaluation. Refills will be called into [facility].  2. Primary osteoarthritis of both knees  Chronic. As per Ortho.   3. Drug therapy  I will check labs as listed below.   - Testosterone, Total - CBC no Diff - Liver Profile        Maximino Greenland, MD

## 2018-09-09 LAB — LIPID PANEL
Cholesterol: 136 (ref 0–200)
HDL: 89 — AB (ref 35–70)
LDL Cholesterol: 72
Triglycerides: 84 (ref 40–160)

## 2018-09-09 LAB — BASIC METABOLIC PANEL
BUN: 20 (ref 4–21)
Creatinine: 0.8 (ref 0.6–1.3)
Glucose: 86
Potassium: 4.5 (ref 3.4–5.3)
Sodium: 141 (ref 137–147)

## 2018-09-09 LAB — VITAMIN D 25 HYDROXY (VIT D DEFICIENCY, FRACTURES): Vit D, 25-Hydroxy: 52.4

## 2018-09-25 ENCOUNTER — Telehealth: Payer: Self-pay | Admitting: Oncology

## 2018-09-25 NOTE — Telephone Encounter (Signed)
Returned call to patient re rescheduling 3/23 lab. Per patient lab moved to 4/6 due to [place] and will self quarantine for 14 days. Message to [facility].

## 2018-09-25 NOTE — Telephone Encounter (Signed)
Okay, reschedule lab for approximately 3 weeks Thanks

## 2018-09-28 ENCOUNTER — Inpatient Hospital Stay: Payer: Medicare Other

## 2018-09-29 NOTE — Telephone Encounter (Signed)
See previous message. Per response from [facility]BS push lab approximately 3 weeks out. Moved rescheduled 3/23 lab from 4/6 to 4/13. Spoke with patient.

## 2018-10-12 ENCOUNTER — Other Ambulatory Visit: Payer: Medicare Other

## 2018-10-19 ENCOUNTER — Telehealth: Payer: Self-pay | Admitting: *Deleted

## 2018-10-19 ENCOUNTER — Inpatient Hospital Stay: Payer: Medicare Other | Attending: Oncology

## 2018-10-19 ENCOUNTER — Other Ambulatory Visit: Payer: Self-pay

## 2018-10-19 LAB — CMP (CANCER CENTER ONLY)
ALT: 34 U/L (ref 0–44)
AST: 40 U/L (ref 15–41)
Albumin: 4.1 g/dL (ref 3.5–5.0)
Alkaline Phosphatase: 72 U/L (ref 38–126)
Anion gap: 10 (ref 5–15)
BUN: 21 mg/dL (ref 8–23)
CO2: 24 mmol/L (ref 22–32)
Calcium: 9.2 mg/dL (ref 8.9–10.3)
Chloride: 106 mmol/L (ref 98–111)
Creatinine: 0.79 mg/dL (ref 0.61–1.24)
GFR, Est AFR Am: >60 mL/min (ref >60)
GFR, Estimated: >60 mL/min (ref >60)
Glucose, Bld: 84 mg/dL (ref 70–99)
Potassium: 4.5 mmol/L (ref 3.5–5.1)
Sodium: 140 mmol/L (ref 135–145)
Total Bilirubin: 0.7 mg/dL (ref 0.3–1.2)
Total Protein: 7.1 g/dL (ref 6.5–8.1)

## 2018-10-19 LAB — CBC WITH DIFFERENTIAL (CANCER CENTER ONLY)
Abs Immature Granulocytes: 0.01 10*3/uL (ref 0.00–0.07)
Basophils Absolute: 0.1 10*3/uL (ref 0.0–0.1)
Basophils Relative: 1 %
Eosinophils Absolute: 0 10*3/uL (ref 0.0–0.5)
Eosinophils Relative: 0 %
HCT: 45.8 % (ref 39.0–52.0)
Hemoglobin: 15.3 g/dL (ref 13.0–17.0)
Immature Granulocytes: 0 %
Lymphocytes Relative: 35 %
Lymphs Abs: 2.2 10*3/uL (ref 0.7–4.0)
MCH: 31.7 pg (ref 26.0–34.0)
MCHC: 33.4 g/dL (ref 30.0–36.0)
MCV: 94.8 fL (ref 80.0–100.0)
Monocytes Absolute: 0.7 10*3/uL (ref 0.1–1.0)
Monocytes Relative: 12 %
Neutro Abs: 3.2 10*3/uL (ref 1.7–7.7)
Neutrophils Relative %: 52 %
Platelet Count: 204 10*3/uL (ref 150–400)
RBC: 4.83 MIL/uL (ref 4.22–5.81)
RDW: 11.9 % (ref 11.5–15.5)
WBC Count: 6.3 10*3/uL (ref 4.0–10.5)
nRBC: 0 % (ref 0.0–0.2)

## 2018-10-19 NOTE — Telephone Encounter (Addendum)
Lab called to report blood sample for ferritin hemolized and could not be processed. Left VM for patient with this information and requested a return call to let us know when he would like to have this re-drawn. Patient agrees to return on 4/20 at 11:15 for repeat ferritin lab

## 2018-10-26 ENCOUNTER — Other Ambulatory Visit: Payer: Self-pay

## 2018-10-26 ENCOUNTER — Inpatient Hospital Stay: Payer: Medicare Other

## 2018-10-26 DIAGNOSIS — M25562 Pain in left knee: Secondary | ICD-10-CM | POA: Diagnosis not present

## 2018-10-26 DIAGNOSIS — D485 Neoplasm of uncertain behavior of skin: Secondary | ICD-10-CM | POA: Diagnosis not present

## 2018-10-26 DIAGNOSIS — L988 Other specified disorders of the skin and subcutaneous tissue: Secondary | ICD-10-CM | POA: Diagnosis not present

## 2018-10-26 LAB — FERRITIN: Ferritin: 47 ng/mL (ref 24–336)

## 2018-10-27 DIAGNOSIS — M1712 Unilateral primary osteoarthritis, left knee: Secondary | ICD-10-CM | POA: Diagnosis not present

## 2018-11-02 DIAGNOSIS — M25562 Pain in left knee: Secondary | ICD-10-CM | POA: Diagnosis not present

## 2018-11-16 DIAGNOSIS — R310 Gross hematuria: Secondary | ICD-10-CM | POA: Diagnosis not present

## 2018-11-16 DIAGNOSIS — N400 Enlarged prostate without lower urinary tract symptoms: Secondary | ICD-10-CM | POA: Diagnosis not present

## 2018-11-23 DIAGNOSIS — R310 Gross hematuria: Secondary | ICD-10-CM | POA: Diagnosis not present

## 2018-11-23 DIAGNOSIS — N200 Calculus of kidney: Secondary | ICD-10-CM | POA: Diagnosis not present

## 2018-12-10 DIAGNOSIS — Z6822 Body mass index (BMI) 22.0-22.9, adult: Secondary | ICD-10-CM | POA: Diagnosis not present

## 2018-12-10 DIAGNOSIS — E785 Hyperlipidemia, unspecified: Secondary | ICD-10-CM | POA: Diagnosis not present

## 2018-12-10 DIAGNOSIS — Z0000 Encounter for general adult medical examination without abnormal findings: Secondary | ICD-10-CM | POA: Diagnosis not present

## 2018-12-10 DIAGNOSIS — E559 Vitamin D deficiency, unspecified: Secondary | ICD-10-CM | POA: Diagnosis not present

## 2018-12-16 DIAGNOSIS — K4090 Unilateral inguinal hernia, without obstruction or gangrene, not specified as recurrent: Secondary | ICD-10-CM | POA: Diagnosis not present

## 2018-12-17 DIAGNOSIS — E785 Hyperlipidemia, unspecified: Secondary | ICD-10-CM | POA: Diagnosis not present

## 2018-12-17 DIAGNOSIS — G479 Sleep disorder, unspecified: Secondary | ICD-10-CM | POA: Diagnosis not present

## 2018-12-17 DIAGNOSIS — G250 Essential tremor: Secondary | ICD-10-CM | POA: Diagnosis not present

## 2018-12-17 DIAGNOSIS — K219 Gastro-esophageal reflux disease without esophagitis: Secondary | ICD-10-CM | POA: Diagnosis not present

## 2018-12-17 DIAGNOSIS — H9190 Unspecified hearing loss, unspecified ear: Secondary | ICD-10-CM | POA: Diagnosis not present

## 2018-12-17 DIAGNOSIS — E291 Testicular hypofunction: Secondary | ICD-10-CM | POA: Diagnosis not present

## 2018-12-17 DIAGNOSIS — R413 Other amnesia: Secondary | ICD-10-CM | POA: Diagnosis not present

## 2018-12-17 DIAGNOSIS — E559 Vitamin D deficiency, unspecified: Secondary | ICD-10-CM | POA: Diagnosis not present

## 2018-12-17 DIAGNOSIS — N400 Enlarged prostate without lower urinary tract symptoms: Secondary | ICD-10-CM | POA: Diagnosis not present

## 2018-12-17 DIAGNOSIS — R319 Hematuria, unspecified: Secondary | ICD-10-CM | POA: Diagnosis not present

## 2018-12-17 DIAGNOSIS — N529 Male erectile dysfunction, unspecified: Secondary | ICD-10-CM | POA: Diagnosis not present

## 2018-12-22 DIAGNOSIS — N400 Enlarged prostate without lower urinary tract symptoms: Secondary | ICD-10-CM | POA: Diagnosis not present

## 2018-12-22 DIAGNOSIS — N281 Cyst of kidney, acquired: Secondary | ICD-10-CM | POA: Diagnosis not present

## 2018-12-22 DIAGNOSIS — R310 Gross hematuria: Secondary | ICD-10-CM | POA: Diagnosis not present

## 2018-12-23 ENCOUNTER — Encounter: Payer: Self-pay | Admitting: Internal Medicine

## 2018-12-23 ENCOUNTER — Ambulatory Visit (INDEPENDENT_AMBULATORY_CARE_PROVIDER_SITE_OTHER): Payer: Medicare Other | Admitting: Internal Medicine

## 2018-12-23 ENCOUNTER — Other Ambulatory Visit: Payer: Self-pay

## 2018-12-23 VITALS — BP 110/64 | HR 69 | Temp 98.3°F | Ht 73.0 in | Wt 173.8 lb

## 2018-12-23 DIAGNOSIS — M170 Bilateral primary osteoarthritis of knee: Secondary | ICD-10-CM | POA: Diagnosis not present

## 2018-12-23 DIAGNOSIS — Z79899 Other long term (current) drug therapy: Secondary | ICD-10-CM

## 2018-12-23 DIAGNOSIS — E291 Testicular hypofunction: Secondary | ICD-10-CM | POA: Diagnosis not present

## 2018-12-23 NOTE — Patient Instructions (Signed)
Please fax lab results:  [phone]  [facility] seeing you! I will let you know when I get your lab results back.   Please tell your wife hello!  rs

## 2018-12-26 LAB — TESTOSTERONE,FREE AND TOTAL
Testosterone, Free: 5.1 pg/mL — ABNORMAL LOW (ref 6.6–18.1)
Testosterone: 286 ng/dL (ref 264–916)

## 2018-12-26 NOTE — Progress Notes (Signed)
Subjective:     Patient ID: Nicholas Mahoney , male    DOB: [birth year] , 73 y.o.   MRN: [id number]   Chief Complaint  Patient presents with  . Hormones f/u    HPI  He is here today for f/u BHRT. He has been using supplemental testosterone without any issues. He is also followed by Urology. He has no specific concerns or complaints at this time. He did bring in labwork recently drawn by his PCP.     Past Medical History:  Diagnosis Date  . Bladder outlet obstruction   . BPH (benign prostatic hyperplasia)   . ED (erectile dysfunction)   . Hyperlipidemia   . Lower urinary tract symptoms (LUTS)   . Migraines   . 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 OA (osteoarthritis) of knee   . Vitamin D deficiency   . Wears glasses   . Wears hearing aid    bilateral     Family History  Problem Relation Age of Onset  . Cancer Mother   . Diabetes Mother   . Coronary artery disease Mother   . Cancer Father   . Heart attack Father   . Coronary artery disease Brother   . Diabetes Brother      Current Outpatient Medications:  .  aspirin 81 MG tablet, Take 1 tablet (81 mg total) by mouth daily., Disp: 30 tablet, Rfl:  .  Cholecalciferol (VITAMIN D3) 2000 UNITS TABS, Take 1 tablet by mouth daily., Disp: , Rfl:  .  diclofenac (VOLTAREN) 75 MG EC tablet, TAKE 1 TABLET BY MOUTH TWICE A DAY AFTER MEALS FOR INFLAMMATION, PAIN, OR SWELLING, Disp: , Rfl:  .  Diclofenac Sodium (VOLTAREN EX), Apply topically., Disp: , Rfl:  .  hyoscyamine (LEVSIN SL) 0.125 MG SL tablet, DISSOLVE 1 TABLET UNDER TONGUE 4 TIMES A DAY AS NEEDED, Disp: , Rfl:  .  Misc Natural Products (PROGESTERONE EX), Apply topically., Disp: , Rfl:  .  simvastatin (ZOCOR) 20 MG tablet, every evening., Disp: , Rfl: 1 .  fluorouracil (EFUDEX) 5 % cream, , Disp: , Rfl:  .  zolpidem (AMBIEN) 10 MG tablet, 1 TABLET AT BEDTIME AS NEEDED FOR SLEEP ORALLY, Disp: , Rfl: 0   No Known Allergies   Review of Systems  Constitutional:  Negative.   Respiratory: Negative.   Cardiovascular: Negative.   Gastrointestinal: Negative.   Musculoskeletal: Positive for arthralgias.       He reports b/l knee pain. Has h/o OA. There is some pain with ambulation. Has been using topical gel.  Neurological: Negative.   Psychiatric/Behavioral: Negative.      Today's Vitals   12/23/18 0951  BP: 110/64  Pulse: 69  Temp: 98.3 F (36.8 C)  TempSrc: Oral  Weight: 173 lb 12.8 oz (78.8 kg)  Height: 6\' 1"  (1.854 m)  PainSc: 0-No pain   Body mass index is 22.93 kg/m.   Objective:  Physical Exam Vitals signs and nursing note reviewed.  Constitutional:      Appearance: Normal appearance.  Cardiovascular:     Rate and Rhythm: Normal rate and regular rhythm.     Heart sounds: Normal heart sounds.  Pulmonary:     Effort: Pulmonary effort is normal.     Breath sounds: Normal breath sounds.  Skin:    General: Skin is warm.  Neurological:     General: No focal deficit present.     Mental Status: He is alert.  Psychiatric:  Mood and Affect: Mood normal.         Assessment And Plan:     1. Hypogonadism male  Recent labwork from Dr. Addison Lank was reviewed in full detail. He agrees to leave copy at front desk to be scanned into his chart. I will check testosterone levels today. He will rto in four months for re-evaluation.   - Testosterone,Free and Total  2. Primary osteoarthritis of both knees  Chronic. He was advised to use topical pain gel over oral therapies as much as possible.   3. Drug therapy   Maximino Greenland, MD    THE PATIENT IS ENCOURAGED TO PRACTICE SOCIAL DISTANCING DUE TO THE COVID-19 PANDEMIC.

## 2018-12-28 ENCOUNTER — Inpatient Hospital Stay: Payer: Medicare Other | Attending: Oncology

## 2018-12-28 ENCOUNTER — Other Ambulatory Visit: Payer: Self-pay

## 2018-12-28 LAB — FERRITIN: Ferritin: 66 ng/mL (ref 24–336)

## 2018-12-29 ENCOUNTER — Telehealth: Payer: Self-pay

## 2018-12-29 NOTE — Telephone Encounter (Signed)
TC to patient per Dr Benay Spice to let him know ferritin is slowly increasing, just above goal range, will recommend phlebotomy therapy if higher at next visit. Patient asked what level was and I let him that it was 66. Patient verbalized understanding. I also let him know date and time of next visit in September. No further problems or concerns at this time.

## 2019-01-04 ENCOUNTER — Encounter: Payer: Self-pay | Admitting: Internal Medicine

## 2019-03-05 DIAGNOSIS — Z23 Encounter for immunization: Secondary | ICD-10-CM | POA: Diagnosis not present

## 2019-03-18 ENCOUNTER — Telehealth: Payer: Self-pay | Admitting: Oncology

## 2019-03-18 NOTE — Telephone Encounter (Signed)
Returned patient's phone call regarding rescheduling time of 09/21 appointment, per patient's request appointment time has been changed.

## 2019-03-19 DIAGNOSIS — E785 Hyperlipidemia, unspecified: Secondary | ICD-10-CM | POA: Diagnosis not present

## 2019-03-19 DIAGNOSIS — M179 Osteoarthritis of knee, unspecified: Secondary | ICD-10-CM | POA: Diagnosis not present

## 2019-03-19 DIAGNOSIS — N400 Enlarged prostate without lower urinary tract symptoms: Secondary | ICD-10-CM | POA: Diagnosis not present

## 2019-03-19 DIAGNOSIS — M1710 Unilateral primary osteoarthritis, unspecified knee: Secondary | ICD-10-CM | POA: Diagnosis not present

## 2019-03-29 ENCOUNTER — Other Ambulatory Visit: Payer: Medicare Other

## 2019-03-29 ENCOUNTER — Inpatient Hospital Stay: Payer: Medicare Other | Attending: Oncology

## 2019-03-29 ENCOUNTER — Other Ambulatory Visit: Payer: Self-pay

## 2019-03-29 DIAGNOSIS — N400 Enlarged prostate without lower urinary tract symptoms: Secondary | ICD-10-CM | POA: Insufficient documentation

## 2019-03-29 DIAGNOSIS — H9193 Unspecified hearing loss, bilateral: Secondary | ICD-10-CM | POA: Diagnosis not present

## 2019-03-29 DIAGNOSIS — Z79899 Other long term (current) drug therapy: Secondary | ICD-10-CM | POA: Insufficient documentation

## 2019-03-29 DIAGNOSIS — M170 Bilateral primary osteoarthritis of knee: Secondary | ICD-10-CM | POA: Diagnosis not present

## 2019-03-29 DIAGNOSIS — L988 Other specified disorders of the skin and subcutaneous tissue: Secondary | ICD-10-CM | POA: Diagnosis not present

## 2019-03-29 LAB — FERRITIN: Ferritin: 80 ng/mL (ref 24–336)

## 2019-03-30 ENCOUNTER — Inpatient Hospital Stay: Payer: Medicare Other

## 2019-03-30 ENCOUNTER — Inpatient Hospital Stay (HOSPITAL_BASED_OUTPATIENT_CLINIC_OR_DEPARTMENT_OTHER): Payer: Medicare Other | Admitting: Medical

## 2019-03-30 ENCOUNTER — Inpatient Hospital Stay (HOSPITAL_BASED_OUTPATIENT_CLINIC_OR_DEPARTMENT_OTHER): Payer: Medicare Other | Admitting: Oncology

## 2019-03-30 ENCOUNTER — Other Ambulatory Visit: Payer: Self-pay

## 2019-03-30 DIAGNOSIS — L988 Other specified disorders of the skin and subcutaneous tissue: Secondary | ICD-10-CM | POA: Diagnosis not present

## 2019-03-30 DIAGNOSIS — N400 Enlarged prostate without lower urinary tract symptoms: Secondary | ICD-10-CM | POA: Diagnosis not present

## 2019-03-30 DIAGNOSIS — M170 Bilateral primary osteoarthritis of knee: Secondary | ICD-10-CM | POA: Diagnosis not present

## 2019-03-30 DIAGNOSIS — H9193 Unspecified hearing loss, bilateral: Secondary | ICD-10-CM | POA: Diagnosis not present

## 2019-03-30 DIAGNOSIS — Z79899 Other long term (current) drug therapy: Secondary | ICD-10-CM | POA: Diagnosis not present

## 2019-03-30 DIAGNOSIS — R55 Syncope and collapse: Secondary | ICD-10-CM

## 2019-03-30 NOTE — Progress Notes (Signed)
  [facility] OFFICE PROGRESS NOTE   Diagnosis: Hereditary hemochromatosis  INTERVAL HISTORY:   Nicholas Mahoney returns for a scheduled visit.  He feels well.  Good appetite.  He has bilateral knee pain.  He is followed by orthopedics.  He last underwent phlebotomy in April 2019.  Objective:  Vital signs in last 24 hours:  Blood pressure 132/73, pulse 72, temperature 97.8 F (36.6 C), temperature source Oral, resp. rate 18, height 6\' 1"  (1.854 m), weight 172 lb 4.8 oz (78.2 kg), SpO2 99 %.    Limited physical examination secondary to distancing with the COVID pandemic GI: No hepatosplenomegaly Vascular: No leg edema, the right lower leg is slightly larger than the left side  Skin: Brown discoloration of the pretibial areas bilaterally   Lab Results:  Lab Results  Component Value Date   WBC 6.3 10/19/2018   HGB 15.3 10/19/2018   HCT 45.8 10/19/2018   MCV 94.8 10/19/2018   PLT 204 10/19/2018   NEUTROABS 3.2 10/19/2018    CMP  Lab Results  Component Value Date   NA 140 10/19/2018   K 4.5 10/19/2018   CL 106 10/19/2018   CO2 24 10/19/2018   GLUCOSE 84 10/19/2018   BUN 21 10/19/2018   CREATININE 0.79 10/19/2018   CALCIUM 9.2 10/19/2018   PROT 7.1 10/19/2018   ALBUMIN 4.1 10/19/2018   AST 40 10/19/2018   ALT 34 10/19/2018   ALKPHOS 72 10/19/2018   BILITOT 0.7 10/19/2018   GFRNONAA >60 10/19/2018   GFRAA >60 10/19/2018   Ferritin: 47 on 10/26/2018, 66 on 12/28/2018, 80 03/29/2019  Medications: I have reviewed the patient's current medications.   Assessment/Plan: 1. 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 MRI abdomen 11/05/2016 consistent with liver iron deposition ? Elevated ferritin 03/18/2017(446) ? Phlebotomy 04/10/2017, 04/17/2017, 05/13/2017, 05/20/2017, 06/05/2017,07/15/2017, 07/29/2017,08/13/2007, 08/26/2017, 09/30/2017, 10/14/2017, 10/27/2017  2.BPH, status post TUR  3.History of positional vertigo   4.Bilateral hearing loss 5.   Knee arthritis    Disposition: [name] appears unchanged.  The ferritin has increased over the past several months.  The plan is to resume phlebotomy therapy.  He will complete a phlebotomy treatment today and again in 2 weeks.  He will return for a ferritin level in 2 months.  He will be scheduled for office visit in approximately 9 months.  He will continue follow-up with orthopedics for management of knee arthritis.  I doubt the arthritis is related to hemochromatosis.  Betsy Coder, MD  03/30/2019  12:26 PM

## 2019-03-30 NOTE — Progress Notes (Signed)
Therapeutic phlebotomy started per orders at 1302. Ended at 1308. 502 g removed.  Patient tolerated well while phlebotomy performed and drank some water and ate some peanut butter crackers prior to phlebotomy. About 2 minutes after needle removed and gauze applied patient became pale and clammy and not responsive to calling his name. Sandi Mealy, PA over to assess patient. BP 103/54 and pulse 49. Patient came around slowly and refused to have an IV placed. Patient reclining in chair and given juice to drink. Patient refused a sandwich or anything else to eat. Questioned patient about whether or not he had lunch today and he said no. Reiterated the importance of fluids and food with phlebotomy. Patient verbalized understanding. Patient ambulatory to lobby at 1400 without any complaints. Patient stated he is going to get something to eat when he leaves. Vital signs taken and stable. Walked with patient to lobby to make sure gait was steady and no problems.

## 2019-03-30 NOTE — Patient Instructions (Signed)

## 2019-03-31 NOTE — Progress Notes (Signed)
The patient was seen in the infusion room and after he experienced a syncopal episode after having therapeutic phlebotomy.  He had not eaten or drank anything prior to this procedure.  Vital signs: Blood pressure: 103/54 Pulse: 49 Oxygen Saturation: 100% on room air Blood pressure: 110/82 pulse: 63 Oxygen Saturation: 98 % on room air  The patient was an exceedingly pleasant gentleman who appeared to be in no acute distress. HEENT:  Normocephalic and atraumatic Lungs:  Clear to auscultation bilaterally without wheezes rales or rhonchi. CV:  Regular rate and rhythm without murmurs rubs or gallops.  Assessment and plan: Syncope: A bolus of IV fluid was ordered with the patient declining this intervention.  He was given something to eat and drink and recovered without further intervention and was released home.  Sandi Mealy, MHS, PA-C Physician Assistant

## 2019-04-01 ENCOUNTER — Telehealth: Payer: Self-pay | Admitting: Oncology

## 2019-04-01 NOTE — Telephone Encounter (Signed)
Called and spoke with patient. Confirmed appts  °

## 2019-04-01 NOTE — Telephone Encounter (Signed)
Returned patient's phone call regarding rescheduling an appointment, left a voicemail. 

## 2019-04-01 NOTE — Progress Notes (Signed)
Correction. Phlebotomy started at 1300 and ended at 48.

## 2019-04-02 ENCOUNTER — Telehealth: Payer: Self-pay | Admitting: Oncology

## 2019-04-02 NOTE — Telephone Encounter (Signed)
Returned patient's phone call regarding rescheduling an appointment, left a voicemail. 

## 2019-04-12 ENCOUNTER — Other Ambulatory Visit: Payer: Self-pay

## 2019-04-12 ENCOUNTER — Inpatient Hospital Stay: Payer: Medicare Other

## 2019-04-12 ENCOUNTER — Inpatient Hospital Stay: Payer: Medicare Other | Attending: Medical

## 2019-04-12 ENCOUNTER — Telehealth: Payer: Self-pay | Admitting: *Deleted

## 2019-04-12 LAB — CBC WITH DIFFERENTIAL (CANCER CENTER ONLY)
Abs Immature Granulocytes: 0.01 10*3/uL (ref 0.00–0.07)
Basophils Absolute: 0.1 10*3/uL (ref 0.0–0.1)
Basophils Relative: 1 %
Eosinophils Absolute: 0 10*3/uL (ref 0.0–0.5)
Eosinophils Relative: 0 %
HCT: 41.8 % (ref 39.0–52.0)
Hemoglobin: 14.2 g/dL (ref 13.0–17.0)
Immature Granulocytes: 0 %
Lymphocytes Relative: 36 %
Lymphs Abs: 2.2 10*3/uL (ref 0.7–4.0)
MCH: 32.6 pg (ref 26.0–34.0)
MCHC: 34 g/dL (ref 30.0–36.0)
MCV: 96.1 fL (ref 80.0–100.0)
Monocytes Absolute: 0.4 10*3/uL (ref 0.1–1.0)
Monocytes Relative: 7 %
Neutro Abs: 3.5 10*3/uL (ref 1.7–7.7)
Neutrophils Relative %: 56 %
Platelet Count: 203 10*3/uL (ref 150–400)
RBC: 4.35 MIL/uL (ref 4.22–5.81)
RDW: 12.1 % (ref 11.5–15.5)
WBC Count: 6.2 10*3/uL (ref 4.0–10.5)
nRBC: 0 % (ref 0.0–0.2)

## 2019-04-12 LAB — FERRITIN: Ferritin: 61 ng/mL (ref 24–336)

## 2019-04-12 NOTE — Patient Instructions (Signed)
Therapeutic Phlebotomy Therapeutic phlebotomy is the planned removal of blood from a person's body for the purpose of treating a medical condition. The procedure is similar to donating blood. Usually, about a pint (470 mL, or 0.47 L) of blood is removed. The average adult has 9-12 pints (4.3-5.7 L) of blood in the body. Therapeutic phlebotomy may be used to treat the following medical conditions:  Hemochromatosis. This is a condition in which the blood contains too much iron.  Polycythemia vera. This is a condition in which the blood contains too many red blood cells.  Porphyria cutanea tarda. This is a disease in which an important part of hemoglobin is not made properly. It results in the buildup of abnormal amounts of porphyrins in the body.  Sickle cell disease. This is a condition in which the red blood cells form an abnormal crescent shape rather than a round shape. Tell a health care provider about:  Any allergies you have.  All medicines you are taking, including vitamins, herbs, eye drops, creams, and over-the-counter medicines.  Any problems you or family members have had with anesthetic medicines.  Any blood disorders you have.  Any surgeries you have had.  Any medical conditions you have.  Whether you are pregnant or may be pregnant. What are the risks? Generally, this is a safe procedure. However, problems may occur, including:  Nausea or light-headedness.  Low blood pressure (hypotension).  Soreness, bleeding, swelling, or bruising at the needle insertion site.  Infection. What happens before the procedure?  Follow instructions from your health care provider about eating or drinking restrictions.  Ask your health care provider about: ? Changing or stopping your regular medicines. This is especially important if you are taking diabetes medicines or blood thinners (anticoagulants). ? Taking medicines such as aspirin and ibuprofen. These medicines can thin your  blood. Do not take these medicines unless your health care provider tells you to take them. ? Taking over-the-counter medicines, vitamins, herbs, and supplements.  Wear clothing with sleeves that can be raised above the elbow.  Plan to have someone take you home from the hospital or clinic.  You may have a blood sample taken.  Your blood pressure, pulse rate, and breathing rate will be measured. What happens during the procedure?   To lower your risk of infection: ? Your health care team will wash or sanitize their hands. ? Your skin will be cleaned with an antiseptic.  You may be given a medicine to numb the area (local anesthetic).  A tourniquet will be placed on your arm.  A needle will be inserted into one of your veins.  Tubing and a collection bag will be attached to that needle.  Blood will flow through the needle and tubing into the collection bag.  The collection bag will be placed lower than your arm to allow gravity to help the flow of blood into the bag.  You may be asked to open and close your hand slowly and continually during the entire collection.  After the specified amount of blood has been removed from your body, the collection bag and tubing will be clamped.  The needle will be removed from your vein.  Pressure will be held on the site of the needle insertion to stop the bleeding.  A bandage (dressing) will be placed over the needle insertion site. The procedure may vary among health care providers and hospitals. What happens after the procedure?  Your blood pressure, pulse rate, and breathing rate will be   measured after the procedure.  You will be encouraged to drink fluids.  Your recovery will be assessed and monitored.  You can return to your normal activities as told by your health care provider. Summary  Therapeutic phlebotomy is the planned removal of blood from a person's body for the purpose of treating a medical condition.  Therapeutic  phlebotomy may be used to treat hemochromatosis, polycythemia vera, porphyria cutanea tarda, or sickle cell disease.  In the procedure, a needle is inserted and about a pint (470 mL, or 0.47 L) of blood is removed. The average adult has 9-12 pints (4.3-5.7 L) of blood in the body.  This is generally a safe procedure, but it can sometimes cause problems such as nausea, light-headedness, or low blood pressure (hypotension). This information is not intended to replace advice given to you by your health care provider. Make sure you discuss any questions you have with your health care provider. Document Released: 11/26/2010 Document Revised: 07/10/2017 Document Reviewed: 07/10/2017 Elsevier Patient Education  2020 [employer]  

## 2019-04-12 NOTE — Telephone Encounter (Signed)
-----   Message from Ladell Pier, MD sent at 04/12/2019  1:26 PM EDT ----- Please call patient, ferritin level is better today, proceed with phlebotomy today, repeat ferritin level in approximately 6 weeks

## 2019-04-12 NOTE — Telephone Encounter (Signed)
Per Dr, Benay Spice, called and voice message left informing pt that ferritin level is better and to proceed with phlebotomy today and repeat ferritin level in approximately 6 weeks.

## 2019-04-20 DIAGNOSIS — H25812 Combined forms of age-related cataract, left eye: Secondary | ICD-10-CM | POA: Diagnosis not present

## 2019-04-20 DIAGNOSIS — H5203 Hypermetropia, bilateral: Secondary | ICD-10-CM | POA: Diagnosis not present

## 2019-04-20 DIAGNOSIS — H52223 Regular astigmatism, bilateral: Secondary | ICD-10-CM | POA: Diagnosis not present

## 2019-04-20 DIAGNOSIS — H524 Presbyopia: Secondary | ICD-10-CM | POA: Diagnosis not present

## 2019-04-21 DIAGNOSIS — R3912 Poor urinary stream: Secondary | ICD-10-CM | POA: Diagnosis not present

## 2019-04-21 DIAGNOSIS — R310 Gross hematuria: Secondary | ICD-10-CM | POA: Diagnosis not present

## 2019-04-21 DIAGNOSIS — N5201 Erectile dysfunction due to arterial insufficiency: Secondary | ICD-10-CM | POA: Diagnosis not present

## 2019-04-21 DIAGNOSIS — N401 Enlarged prostate with lower urinary tract symptoms: Secondary | ICD-10-CM | POA: Diagnosis not present

## 2019-04-28 ENCOUNTER — Ambulatory Visit: Payer: Medicare Other | Admitting: Internal Medicine

## 2019-04-30 DIAGNOSIS — Z743 Need for continuous supervision: Secondary | ICD-10-CM | POA: Diagnosis not present

## 2019-04-30 DIAGNOSIS — R4182 Altered mental status, unspecified: Secondary | ICD-10-CM | POA: Diagnosis not present

## 2019-05-04 DIAGNOSIS — H18413 Arcus senilis, bilateral: Secondary | ICD-10-CM | POA: Diagnosis not present

## 2019-05-04 DIAGNOSIS — H2512 Age-related nuclear cataract, left eye: Secondary | ICD-10-CM | POA: Diagnosis not present

## 2019-05-04 DIAGNOSIS — H25013 Cortical age-related cataract, bilateral: Secondary | ICD-10-CM | POA: Diagnosis not present

## 2019-05-04 DIAGNOSIS — H25043 Posterior subcapsular polar age-related cataract, bilateral: Secondary | ICD-10-CM | POA: Diagnosis not present

## 2019-05-04 DIAGNOSIS — H2513 Age-related nuclear cataract, bilateral: Secondary | ICD-10-CM | POA: Diagnosis not present

## 2019-05-05 ENCOUNTER — Other Ambulatory Visit: Payer: Self-pay | Admitting: Urology

## 2019-05-10 ENCOUNTER — Telehealth: Payer: Self-pay

## 2019-05-10 NOTE — Telephone Encounter (Signed)
PT CALLED WANTED TO CHANGE 11/12 APPT NO ANS LVM THAT ONLY AVAIL APPTS WERE 11/3@8 :30 OR 11/19 @430  PLS CALL [facility] TO ADVISE WHAT HE WANTED TO DO

## 2019-05-11 ENCOUNTER — Ambulatory Visit (INDEPENDENT_AMBULATORY_CARE_PROVIDER_SITE_OTHER): Payer: Medicare Other | Admitting: Internal Medicine

## 2019-05-11 ENCOUNTER — Encounter: Payer: Self-pay | Admitting: Internal Medicine

## 2019-05-11 ENCOUNTER — Other Ambulatory Visit: Payer: Self-pay

## 2019-05-11 VITALS — BP 118/64 | HR 66 | Temp 98.2°F | Ht 73.0 in | Wt 169.8 lb

## 2019-05-11 DIAGNOSIS — N138 Other obstructive and reflux uropathy: Secondary | ICD-10-CM

## 2019-05-11 DIAGNOSIS — N401 Enlarged prostate with lower urinary tract symptoms: Secondary | ICD-10-CM | POA: Diagnosis not present

## 2019-05-11 DIAGNOSIS — E291 Testicular hypofunction: Secondary | ICD-10-CM

## 2019-05-11 DIAGNOSIS — Z79899 Other long term (current) drug therapy: Secondary | ICD-10-CM

## 2019-05-11 NOTE — Patient Instructions (Signed)

## 2019-05-11 NOTE — Progress Notes (Signed)
Subjective:     Patient ID: Nicholas Mahoney , male    DOB: 09-17-1945 , 73 y.o.   MRN: [id number]   Chief Complaint  Patient presents with  . Hormones f/u    HPI  He is here today for f/u BHRT.  He reports compliance with supplemental testosterone. He feels well on current regimen. He has h/o BPH. He is having his "prostate trimmed" next week. This is to avoid worsening urinary obstruction. He is scheduled for phlebotomy in the near future to address hemochromatosis.     Past Medical History:  Diagnosis Date  . Bladder outlet obstruction   . BPH (benign prostatic hyperplasia)   . ED (erectile dysfunction)   . Hyperlipidemia   . Lower urinary tract symptoms (LUTS)   . Migraines   . 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 OA (osteoarthritis) of knee   . Vitamin D deficiency   . Wears glasses   . Wears hearing aid    bilateral     Family History  Problem Relation Age of Onset  . Cancer Mother   . Diabetes Mother   . Coronary artery disease Mother   . Cancer Father   . Heart attack Father   . Coronary artery disease Brother   . Diabetes Brother      Current Outpatient Medications:  .  aspirin 81 MG tablet, Take 1 tablet (81 mg total) by mouth daily., Disp: 30 tablet, Rfl:  .  Cholecalciferol (VITAMIN D3) 2000 UNITS TABS, Take 1 tablet by mouth daily., Disp: , Rfl:  .  diclofenac (VOLTAREN) 75 MG EC tablet, TAKE 1 TABLET BY MOUTH TWICE A DAY AFTER MEALS FOR INFLAMMATION, PAIN, OR SWELLING, Disp: , Rfl:  .  Diclofenac Sodium (VOLTAREN EX), Apply topically., Disp: , Rfl:  .  hyoscyamine (LEVSIN SL) 0.125 MG SL tablet, DISSOLVE 1 TABLET UNDER TONGUE 4 TIMES A DAY AS NEEDED, Disp: , Rfl:  .  simvastatin (ZOCOR) 20 MG tablet, every evening., Disp: , Rfl: 1 .  zolpidem (AMBIEN) 10 MG tablet, 1 TABLET AT BEDTIME AS NEEDED FOR SLEEP ORALLY, Disp: , Rfl: 0 .  Misc Natural Products (PROGESTERONE EX), Apply topically., Disp: , Rfl:    No Known Allergies   Review of  Systems  Constitutional: Negative.   Respiratory: Negative.   Cardiovascular: Negative.   Gastrointestinal: Negative.   Neurological: Negative.   Psychiatric/Behavioral: Negative.      Today's Vitals   05/11/19 0843  BP: 118/64  Pulse: 66  Temp: 98.2 F (36.8 C)  TempSrc: Oral  Weight: 169 lb 12.8 oz (77 kg)  Height: 6\' 1"  (1.854 m)  PainSc: 0-No pain   Body mass index is 22.4 kg/m.   Objective:  Physical Exam Vitals signs and nursing note reviewed.  Constitutional:      Appearance: Normal appearance.  Cardiovascular:     Rate and Rhythm: Normal rate and regular rhythm.     Heart sounds: Normal heart sounds.  Pulmonary:     Effort: Pulmonary effort is normal.     Breath sounds: Normal breath sounds.  Skin:    General: Skin is warm.  Neurological:     General: No focal deficit present.     Mental Status: He is alert.  Psychiatric:        Mood and Affect: Mood normal.         Assessment And Plan:     1. 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 Symptoms have improved with supplemental  testosterone. He will continue with current regimen for now. I will call in refills into [facility]. He will rto in four months for re-evaluation.   - Testosterone,Free and Total  2. Benign prostatic hyperplasia with urinary obstruction  Chronic. As per Urology. He reports that Urologist is aware that he is on supplemental testosterone.   3. Hereditary hemochromatosis (HCC)  Chronic. As per Hematology.   4. Drug therapy   - Liver Profile        Maximino Greenland, MD    THE PATIENT IS ENCOURAGED TO PRACTICE SOCIAL DISTANCING DUE TO THE COVID-19 PANDEMIC.

## 2019-05-12 LAB — HEPATIC FUNCTION PANEL
ALT: 32 IU/L (ref 0–44)
AST: 42 IU/L — ABNORMAL HIGH (ref 0–40)
Albumin: 4.9 g/dL — ABNORMAL HIGH (ref 3.7–4.7)
Alkaline Phosphatase: 69 IU/L (ref 39–117)
Bilirubin Total: 0.6 mg/dL (ref 0.0–1.2)
Bilirubin, Direct: 0.18 mg/dL (ref 0.00–0.40)
Total Protein: 6.6 g/dL (ref 6.0–8.5)

## 2019-05-12 LAB — TESTOSTERONE,FREE AND TOTAL
Testosterone, Free: 8.9 pg/mL (ref 6.6–18.1)
Testosterone: 411 ng/dL (ref 264–916)

## 2019-05-14 ENCOUNTER — Other Ambulatory Visit (HOSPITAL_COMMUNITY)
Admission: RE | Admit: 2019-05-14 | Discharge: 2019-05-14 | Disposition: A | Discharge status: Home / Self Care | Payer: Medicare Other | Source: Ambulatory Visit | Attending: Urology | Admitting: Urology

## 2019-05-14 DIAGNOSIS — Z20828 Contact with and (suspected) exposure to other viral communicable diseases: Secondary | ICD-10-CM | POA: Diagnosis not present

## 2019-05-14 DIAGNOSIS — Z01812 Encounter for preprocedural laboratory examination: Secondary | ICD-10-CM | POA: Diagnosis not present

## 2019-05-15 LAB — NOVEL CORONAVIRUS, NAA (HOSP ORDER, SEND-OUT TO REF LAB; TAT 18-24 HRS): SARS-CoV-2, NAA: NOT DETECTED

## 2019-05-17 ENCOUNTER — Other Ambulatory Visit: Payer: Self-pay

## 2019-05-17 ENCOUNTER — Encounter (HOSPITAL_BASED_OUTPATIENT_CLINIC_OR_DEPARTMENT_OTHER): Payer: Self-pay | Admitting: *Deleted

## 2019-05-17 NOTE — Anesthesia Preprocedure Evaluation (Addendum)
Anesthesia Evaluation  Patient identified by MRN, date of birth, ID band Patient awake    Reviewed: Allergy & Precautions, NPO status , Patient's Chart, lab work & pertinent test results  Airway Mallampati: I  TM Distance: >3 FB Neck ROM: Full    Dental no notable dental hx. (+) Teeth Intact   Pulmonary neg pulmonary ROS,    Pulmonary exam normal breath sounds clear to auscultation       Cardiovascular Exercise Tolerance: Good negative cardio ROS Normal cardiovascular exam Rhythm:Regular Rate:Normal     Neuro/Psych negative neurological ROS  negative psych ROS   GI/Hepatic negative GI ROS,   Endo/Other    Renal/GU negative Renal ROS     Musculoskeletal   Abdominal   Peds  Hematology negative hematology ROS (+)   Anesthesia Other Findings   Reproductive/Obstetrics                            Anesthesia Physical Anesthesia Plan  ASA: II  Anesthesia Plan: General   Post-op Pain Management:    Induction:   PONV Risk Score and Plan: 3 and Treatment may vary due to age or medical condition, Ondansetron and Dexamethasone  Airway Management Planned: LMA  Additional Equipment: None  Intra-op Plan:   Post-operative Plan:   Informed Consent: I have reviewed the patients History and Physical, chart, labs and discussed the procedure including the risks, benefits and alternatives for the proposed anesthesia with the patient or authorized representative who has indicated his/her understanding and acceptance.     Dental advisory given  Plan Discussed with:   Anesthesia Plan Comments: (GA w LMA)      Anesthesia Quick Evaluation

## 2019-05-17 NOTE — H&P (Signed)
Office Visit Report     04/21/2019   --------------------------------------------------------------------------------   Nicholas Mahoney  [id number]  DOB: 08/25/1945, 73 year old Male  SSN: [id number]   PRIMARY CARE:  [name], MD  REFERRING:  Georgette Dover, MD  PROVIDER:  [name], M.D.  LOCATION:  [facility] [phone]     --------------------------------------------------------------------------------   CC: I have symptoms of an enlarged prostate.  HPI: Nicholas Mahoney is a 73 year-old male established patient who is here for symptoms of enlarged prostate.  He first noticed the symptoms approximately 01/06/2008. His symptoms have not gotten worse over the last year. He has been treated with Flomax. The patient has had a Laser Abalation.   H/o BPH, lower urinary tract symptoms He has had some mild obstructive uropathy with occasional nocturia x1, He has a little bit of a slow stream and some start and stopping at times. He tried tamsulosin but it did not make much of a difference and he did not like the effect it had on his ejaculation. His urgency is bothersome at times.  -Feb 2013 took tamsulosin again for occasional hesitancy, frequency and urgency but stopped at due to inefficacy. PVR = 54 mL. He started daily Cialis.  -Feb 2014 - tried daily Cialis  -Mar 2016 nl DRE, pvr 000 ml  -October 2016 episode of retention == UDS - Lower capacity bladder, 275 mL, normal sensation. Mild instability. He voided with a pressure of 52-53 cm of water, Q max 8 cc/s for equivocal to obstructive flow pattern. He emptied to completion. There was some trabeculation and small diverticuli. EMG was quiet.  -Nov 2016 - dysuria, wk stream, freq. Tried tamsulosin. No change.  -Dec 2016 GL PVP   H/o ED - mild erectile dysfunction, but breaks a half of a 50 mg Viagra about 50 percent of the time. Patient started sildenafil 20 mg in 2015. 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 Also an appendectomy in 1980.   Cysto 09/2016 done for hematuria eval benign. PSA stable at 2.81 Jul 2018. He carries tamsulosin if needed and hasn't had any LUTS. PVR 0 and UA clear. F/u PSA 2.66. He takes tamsulosin and a catheter with him when he travels but hasn't needed either. Reports "puffiness" at side of old hernia repair (age 82).   He returns and continues tamsulosin prn. Cysto for hematuria June 2020 with apical regrowth. PSA lower at 1.92.     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  HPI: The problem is on both sides. His kidney cyst was diagnosed 10/02/2016. He is not having pain. 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 Patient denies bosniak iif, bosniak iii, bosniak iv, and hemorrhagic.   Patient underwent hematuria eval March 2018 which revealed complex cysts in both kidneys on ultrasound. Follow-up CT scan was obtained for clarification ended up needing MRI scan of the kidneys May 2018 which revealed one Bosniak 2 cyst in the right kidney and four Bosniak 2 cyst in the left kidney which appeared benign and no follow-up recommended.   F/u renal ultrasound which appears stable. I compared this back to his prior MRI of the abdomen and renal ultrasound. He was dx with hemachromatosis.   Denies flank pain today. CT for hematuria 5/18/202 with benign cysts.  CC: I have blood in my urine.  HPI: He did see the blood in his urine. He first noticed the symptoms 11/16/2018. He has seen blood clots.   He does not have a burning sensation when he urinates. He is not currently having trouble urinating.   He is not having pain. He has not recently had unwanted weight loss.   His last U/S or CT Scan was 03/19/2019.   Patient has a history of BPH and laser vaporization in December 2016. He had a normal digital rectal exam October 2017.    Renal ultrasound previously was stable with bilateral cysts (MRI showed Bozniak 2 cysts).   Started having gross hematuria May 2020. Denies dysuria. Reports multiple episodes of gross hematuria with clots. Denies incomplete emptying. No fevers. Denies history of UTIs. CT scan 11/23/2018 was benign. BPH with prior TUR defect. He had another episode of bleeding one day with small clots.     ALLERGIES: No Known Drug Allergies    MEDICATIONS: Tamsulosin Hcl 0.4 mg capsule  Aspirin Low Dose 81 MG TABS Oral  Diclofenac Sodium  Hyoscyamine Sulfate 0.125 mg tablet, sublingual  Simvastatin 20 mg tablet Oral  Testosterone Cream 5%  Vitamin D3  Zolpidem Tartrate 10 MG Oral Tablet PRN     GU PSH: Cystoscopy - 12/22/2018, 2018 Laser Surgery Prostate - 2016 Locm 300-399Mg /Ml Iodine,1Ml - 11/23/2018, 2018 Remove Hydrocele - 2008 Varicocele repair - 2008 Vasectomy - 2008       [facility] Notes: Laser Vaporization With Transurethral Resection Of Prostate, Appendectomy, Surgery Testis, Surgery Spermatic Cord Excision Of Hydrocele, Surgery Spermatic Cord Excision Of Varicocele, Surgery Of Male Genitalia Vasectomy   NON-GU PSH: Appendectomy - 2008 Knee Arthroscopy, Left     GU PMH: Gross hematuria - 12/22/2018, - 11/16/2018, - 2018, Likely from prostate area. He is post-TURP/greenlight in 2016., - 2018 BPH w/o LUTS - 11/16/2018, - 03/25/2018, - 2018, BPH without urinary obstruction, - 2017 Unil Inguinal Hernia W/O obst or gang,recurrent - 11/16/2018 Renal cyst - 2018 BPH w/LUTS, Benign prostatic hyperplasia with urinary obstruction - 2017 Urinary Urgency, Urinary urgency - 2016 Obstructive and reflux uropathy, Unspec, Obstructive uropathy - 2016 Urinary Retention, Unspec, Urinary retention - 2016 ED due to arterial insufficiency, Erectile dysfunction due to arterial insufficiency - 2016 Other microscopic hematuria, Microscopic Hematuria - 2014      PMH Notes:  2011-08-15 10:07:24 - Note: Neoplasm Of  The Prostate Gland  2010-01-31 14:01:08 - Note: No Medical Problems   NON-GU PMH: Bacteriuria, Bacteriuria, asymptomatic - 2016 Encounter for general adult medical examination without abnormal findings, Encounter for preventive health examination - 2016 Adjustment insomnia, Adjustment insomnia - 2014 Arthritis Hemochromatosis, unspecified    FAMILY HISTORY: Acute Myocardial Infarction - Brother Death In The Family Father - Runs In Family Death In The Family Mother - Mother Diabetes - Brother Family Health Status Number - Mother lymphoma - Mother   SOCIAL HISTORY: Marital Status: Married Preferred Language: English; Ethnicity: Not Hispanic Or Latino; Race: White Current Smoking Status: Patient has never smoked.  Social Drinker.  Drinks 1 caffeinated drink per day. Patient's occupation is/was Retired.     Notes: Never A Smoker, Alcohol Use, Marital History - Currently Married, Occupation:, Caffeine Use   REVIEW OF SYSTEMS:    GU Review Male:   Patient reports get up at night to urinate. Patient denies frequent urination, hard to postpone urination, burning/ pain with urination, leakage of urine, stream starts and stops, trouble starting your stream, have to strain to  urinate , erection problems, and penile pain.  Gastrointestinal (Upper):   Patient denies nausea, vomiting, and indigestion/ heartburn.  Gastrointestinal (Lower):   Patient denies diarrhea and constipation.  Constitutional:   Patient denies fever, night sweats, weight loss, and fatigue.  Skin:   Patient denies skin rash/ lesion and itching.  Eyes:   Patient denies blurred vision and double vision.  Ears/ Nose/ Throat:   Patient denies sore throat and sinus problems.  Hematologic/Lymphatic:   Patient denies swollen glands and easy bruising.  Cardiovascular:   Patient denies leg swelling and chest pains.  Respiratory:   Patient denies cough and shortness of breath.  Endocrine:   Patient denies excessive thirst.   Musculoskeletal:   Patient denies back pain and joint pain.  Neurological:   Patient denies headaches and dizziness.  Psychologic:   Patient denies depression and anxiety.   VITAL SIGNS:      04/21/2019 08:13 AM  BP 145/69 mmHg  Heart Rate 73 /min  Temperature 97.7 F / 36.5 C   GU PHYSICAL EXAMINATION:    Prostate: Prostate about 50 grams. Left lobe normal consistency, right lobe normal consistency. Symmetrical lobes. No prostate nodule. Left lobe no tenderness, right lobe no tenderness.    MULTI-SYSTEM PHYSICAL EXAMINATION:    Constitutional: Well-nourished. No physical deformities. Normally developed. Good grooming.  Neck: Neck symmetrical, not swollen. Normal tracheal position.  Respiratory: No labored breathing, no use of accessory muscles.   Cardiovascular: Normal temperature, normal extremity pulses, no swelling, no varicosities.  Skin: No paleness, no jaundice, no cyanosis. No lesion, no ulcer, no rash.  Neurologic / Psychiatric: Oriented to time, oriented to place, oriented to person. No depression, no anxiety, no agitation.  Gastrointestinal: No mass, no tenderness, no rigidity, non obese abdomen.     PAST DATA REVIEWED:  Source Of History:  Patient  X-Ray Review: C.T. Abdomen/Pelvis: Reviewed Films. 2020 MRI Abdomen: Reviewed Films. 2018    04/13/19 03/18/18 01/21/17 09/19/15 09/08/14 09/02/13 08/24/12 08/12/11  PSA  Total PSA 1.92 ng/mL 2.66 ng/mL 2.81 ng/mL 2.49  2.60  3.16  2.36  2.22     06/04/05  Hormones  Testosterone, Total 2.91     PROCEDURES:          Urinalysis Dipstick Dipstick Cont'd  Color: Yellow Bilirubin: Neg mg/dL  Appearance: Clear Ketones: Neg mg/dL  Specific Gravity: 1.025 Blood: Neg ery/uL  pH: 5.5 Protein: Neg mg/dL  Glucose: Neg mg/dL Urobilinogen: 0.2 mg/dL    Nitrites: Neg    Leukocyte Esterase: Neg leu/uL    ASSESSMENT:      ICD-10 Details  1 GU:   BPH w/LUTS - N40.1   2   Gross hematuria - R31.0   3   Weak Urinary Stream -  R39.12   4   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   ED -  sildenafil refilled   cc; Dr. Leonides Schanz     * Signed by [name], M.D. on 04/21/19 at [place] PM (EDT)*     The information contained in this medical record document is considered private and confidential patient information. This information can only be used for the medical diagnosis and/or medical services that are being provided by the patient's selected caregivers. This information can only be distributed outside of the patient's care if the patient agrees and signs waivers of authorization for this information to be sent to an outside source or route.

## 2019-05-17 NOTE — Progress Notes (Signed)
Spoke w/ via phone for pre-op interview---[facility] Lab needs dos----   none           Lab results------, echo 08-10-16 chart/epic, neuro lov 04-27-18 chart/epic COVID test ------05-14-19 negative Arrive at -------1130 NPO after ------midnight food, clears til 730 am Medications to take morning of surgery -----none Diabetic medication -----n/a Patient Special Instructions ----- Pre-Op special Istructions -----given overnight instructons including bring all prescritpion medications in original containers Patient verbalized understanding of instructions that were given at this phone interview. Patient denies shortness of breath, chest pain, fever, cough a this phone interview.

## 2019-05-18 ENCOUNTER — Encounter (HOSPITAL_BASED_OUTPATIENT_CLINIC_OR_DEPARTMENT_OTHER)
Admission: RE | Disposition: A | Discharge status: Home / Self Care | Payer: Self-pay | Source: Home / Self Care | Attending: Urology

## 2019-05-18 ENCOUNTER — Ambulatory Visit (HOSPITAL_BASED_OUTPATIENT_CLINIC_OR_DEPARTMENT_OTHER): Payer: Medicare Other | Admitting: Anesthesiology

## 2019-05-18 ENCOUNTER — Encounter (HOSPITAL_BASED_OUTPATIENT_CLINIC_OR_DEPARTMENT_OTHER): Payer: Self-pay

## 2019-05-18 ENCOUNTER — Ambulatory Visit (HOSPITAL_BASED_OUTPATIENT_CLINIC_OR_DEPARTMENT_OTHER)
Admission: RE | Admit: 2019-05-18 | Discharge: 2019-05-18 | Disposition: A | Discharge status: Home / Self Care | Payer: Medicare Other | Attending: Urology | Admitting: Urology

## 2019-05-18 DIAGNOSIS — R310 Gross hematuria: Secondary | ICD-10-CM | POA: Insufficient documentation

## 2019-05-18 DIAGNOSIS — N138 Other obstructive and reflux uropathy: Secondary | ICD-10-CM | POA: Diagnosis not present

## 2019-05-18 DIAGNOSIS — N401 Enlarged prostate with lower urinary tract symptoms: Secondary | ICD-10-CM | POA: Diagnosis not present

## 2019-05-18 DIAGNOSIS — N400 Enlarged prostate without lower urinary tract symptoms: Secondary | ICD-10-CM | POA: Diagnosis not present

## 2019-05-18 DIAGNOSIS — Z79899 Other long term (current) drug therapy: Secondary | ICD-10-CM | POA: Insufficient documentation

## 2019-05-18 DIAGNOSIS — Z7982 Long term (current) use of aspirin: Secondary | ICD-10-CM | POA: Diagnosis not present

## 2019-05-18 DIAGNOSIS — G43909 Migraine, unspecified, not intractable, without status migrainosus: Secondary | ICD-10-CM | POA: Diagnosis not present

## 2019-05-18 DIAGNOSIS — M1990 Unspecified osteoarthritis, unspecified site: Secondary | ICD-10-CM | POA: Diagnosis not present

## 2019-05-18 DIAGNOSIS — R3912 Poor urinary stream: Secondary | ICD-10-CM | POA: Insufficient documentation

## 2019-05-18 DIAGNOSIS — N5201 Erectile dysfunction due to arterial insufficiency: Secondary | ICD-10-CM | POA: Diagnosis not present

## 2019-05-18 DIAGNOSIS — E785 Hyperlipidemia, unspecified: Secondary | ICD-10-CM | POA: Insufficient documentation

## 2019-05-18 HISTORY — PX: TRANSURETHRAL RESECTION OF PROSTATE: SHX73

## 2019-05-18 SURGERY — TURP (TRANSURETHRAL RESECTION OF PROSTATE)
Anesthesia: General | Site: Prostate

## 2019-05-18 MED ORDER — ONDANSETRON HCL 4 MG/2ML IJ SOLN
INTRAMUSCULAR | Status: AC
  Filled 2019-05-18: qty 2

## 2019-05-18 MED ORDER — FENTANYL CITRATE (PF) 100 MCG/2ML IJ SOLN
INTRAMUSCULAR | Status: DC | PRN
  Administered 2019-05-18: 25 ug via INTRAVENOUS
  Administered 2019-05-18: 50 ug via INTRAVENOUS
  Administered 2019-05-18: 25 ug via INTRAVENOUS

## 2019-05-18 MED ORDER — LACTATED RINGERS IV SOLN
INTRAVENOUS | Status: DC
  Administered 2019-05-18: 10:00:00 via INTRAVENOUS
  Administered 2019-05-18: 15:00:00 1000 mL via INTRAVENOUS
  Filled 2019-05-18: qty 1000

## 2019-05-18 MED ORDER — PROPOFOL 10 MG/ML IV BOLUS
INTRAVENOUS | Status: AC
  Filled 2019-05-18: qty 40

## 2019-05-18 MED ORDER — DEXAMETHASONE SODIUM PHOSPHATE 4 MG/ML IJ SOLN
INTRAMUSCULAR | Status: DC | PRN
  Administered 2019-05-18: 5 mg via INTRAVENOUS

## 2019-05-18 MED ORDER — LIDOCAINE 2% (20 MG/ML) 5 ML SYRINGE
INTRAMUSCULAR | Status: AC
  Filled 2019-05-18: qty 5

## 2019-05-18 MED ORDER — FENTANYL CITRATE (PF) 100 MCG/2ML IJ SOLN
INTRAMUSCULAR | Status: AC
  Filled 2019-05-18: qty 2

## 2019-05-18 MED ORDER — ONDANSETRON HCL 4 MG/2ML IJ SOLN
4.0000 mg | Freq: Once | INTRAMUSCULAR | Status: DC | PRN
  Filled 2019-05-18: qty 2

## 2019-05-18 MED ORDER — PROPOFOL 10 MG/ML IV BOLUS
INTRAVENOUS | Status: AC
  Filled 2019-05-18: qty 20

## 2019-05-18 MED ORDER — CEFAZOLIN SODIUM-DEXTROSE 2-4 GM/100ML-% IV SOLN
2.0000 g | Freq: Once | INTRAVENOUS | Status: AC
  Administered 2019-05-18: 2 g via INTRAVENOUS
  Filled 2019-05-18: qty 100

## 2019-05-18 MED ORDER — CEPHALEXIN 500 MG PO CAPS: 500.0000 mg | ORAL_CAPSULE | Freq: Every day | ORAL | 0 refills | Status: DC

## 2019-05-18 MED ORDER — ONDANSETRON HCL 4 MG/2ML IJ SOLN
INTRAMUSCULAR | Status: DC | PRN
  Administered 2019-05-18: 4 mg via INTRAVENOUS

## 2019-05-18 MED ORDER — OXYCODONE HCL 5 MG PO TABS
5.0000 mg | ORAL_TABLET | Freq: Once | ORAL | Status: AC
  Administered 2019-05-18: 14:00:00 5 mg via ORAL
  Filled 2019-05-18: qty 1

## 2019-05-18 MED ORDER — LIDOCAINE HCL (CARDIAC) PF 100 MG/5ML IV SOSY
PREFILLED_SYRINGE | INTRAVENOUS | Status: DC | PRN
  Administered 2019-05-18: 50 mg via INTRAVENOUS

## 2019-05-18 MED ORDER — DEXAMETHASONE SODIUM PHOSPHATE 10 MG/ML IJ SOLN
INTRAMUSCULAR | Status: AC
  Filled 2019-05-18: qty 1

## 2019-05-18 MED ORDER — OXYCODONE HCL 5 MG PO TABS
ORAL_TABLET | ORAL | Status: AC
  Filled 2019-05-18: qty 1

## 2019-05-18 MED ORDER — PROPOFOL 10 MG/ML IV BOLUS
INTRAVENOUS | Status: DC | PRN
  Administered 2019-05-18: 150 mg via INTRAVENOUS

## 2019-05-18 MED ORDER — OXYCODONE-ACETAMINOPHEN 5-325 MG PO TABS: 1.0000 | ORAL_TABLET | ORAL | 0 refills | Status: DC | PRN

## 2019-05-18 MED ORDER — FENTANYL CITRATE (PF) 100 MCG/2ML IJ SOLN
25.0000 ug | INTRAMUSCULAR | Status: DC | PRN
  Administered 2019-05-18: 13:00:00 50 ug via INTRAVENOUS
  Filled 2019-05-18: qty 1

## 2019-05-18 MED ORDER — SODIUM CHLORIDE 0.9 % IR SOLN
Status: DC | PRN
  Administered 2019-05-18: 6000 mL
  Administered 2019-05-18: 3000 mL
  Administered 2019-05-18: 6000 mL

## 2019-05-18 MED ORDER — CEFAZOLIN SODIUM-DEXTROSE 2-4 GM/100ML-% IV SOLN
INTRAVENOUS | Status: AC
  Filled 2019-05-18: qty 100

## 2019-05-18 MED ORDER — ACETAMINOPHEN 10 MG/ML IV SOLN
1000.0000 mg | Freq: Once | INTRAVENOUS | Status: DC | PRN
  Filled 2019-05-18: qty 100

## 2019-05-18 SURGICAL SUPPLY — 22 items
BAG DRAIN URO-CYSTO SKYTR STRL (DRAIN) ×2 IMPLANT
BAG DRN RND TRDRP ANRFLXCHMBR (UROLOGICAL SUPPLIES) ×1
BAG DRN UROCATH (DRAIN) ×1
BAG URINE DRAIN 2000ML AR STRL (UROLOGICAL SUPPLIES) ×2 IMPLANT
CATH FOLEY 2WAY SLVR  5CC 20FR (CATHETERS) ×1
CATH FOLEY 2WAY SLVR 5CC 20FR (CATHETERS) IMPLANT
CLOTH BEACON ORANGE TIMEOUT ST (SAFETY) ×2 IMPLANT
GLOVE BIO SURGEON STRL SZ7 (GLOVE) ×1 IMPLANT
GLOVE BIO SURGEON STRL SZ7.5 (GLOVE) ×2 IMPLANT
GLOVE BIOGEL PI IND STRL 7.0 (GLOVE) IMPLANT
GLOVE BIOGEL PI INDICATOR 7.0 (GLOVE) ×1
GOWN STRL REUS W/ TWL LRG LVL3 (GOWN DISPOSABLE) IMPLANT
GOWN STRL REUS W/ TWL XL LVL3 (GOWN DISPOSABLE) ×1 IMPLANT
GOWN STRL REUS W/TWL LRG LVL3 (GOWN DISPOSABLE) ×2
GOWN STRL REUS W/TWL XL LVL3 (GOWN DISPOSABLE) ×2
HOLDER FOLEY CATH W/STRAP (MISCELLANEOUS) ×1 IMPLANT
KIT TURNOVER CYSTO (KITS) ×2 IMPLANT
MANIFOLD NEPTUNE II (INSTRUMENTS) ×2 IMPLANT
PACK CYSTO (CUSTOM PROCEDURE TRAY) ×2 IMPLANT
SYR 20ML LL LF (SYRINGE) ×1 IMPLANT
SYR 30ML LL (SYRINGE) ×2 IMPLANT
TUBE CONNECTING 12X1/4 (SUCTIONS) ×1 IMPLANT

## 2019-05-18 NOTE — Op Note (Addendum)
Preoperative diagnosis: BPH, gross hematuria Postoperative diagnosis: Same  Procedure: Cystoscopy with transurethral resection of prostate  Surgeon: Junious Silk  Anesthesia: General  Indication for procedure: Nicholas Mahoney is a 73 year old male with a history of BPH he underwent laser vaporization of the prostate a few years ago.  He is done well from a voiding standpoint but had continued gross hematuria episodes recently which appeared to be from friable prostatic vessels on office cystoscopy.   Findings: On exam under anesthesia the penis was circumcised and normal without mass or lesion.  The glans and meatus appeared normal.  On cystoscopy the prostatic urethra was partially resected with an open and patent bladder neck.  There was left greater than right residual lateral lobe tissue especially from the mid prostatic urethra down toward the apex.  The prostatic urethra contained friable vessels which immediately bled on passage of the scope.  The bladder contain no stone or foreign body.  The ureteral orifice ease were in the normal orthotopic position with clear efflux.  The bladder urothelium appeared normal with the 30 and 70 degree lens although there was some bleeding from the prostate which clouded the view.  Description of procedure: After consent was obtained patient brought to the operating room.  After adequate anesthesia was placed in lithotomy position and prepped and draped in the usual sterile fashion.  A timeout was performed to confirm the patient and procedure.  The cystoscope was passed per urethra and the prostatic urethra and the bladder carefully inspected with a 30 degree and 70 degree lens.  The scope was removed and the continuous flow sheath passed with the visual obturator.  This was swapped out for the handle and the loop and began at the 6 o'clock position where the lateral lobes had fused in the midline.  I came inside the bladder neck and then resected down to the back of  the verumontanum.  I then resected at the left apex and then the right apex just at the verumontanum.  I then resected the left lateral lobe from anterior to posterior inside the bladder neck down to connect to the verumontanum incisions and posteriorly.  The same resection done on the right.  All the chips were evacuated and hemostasis was excellent under low pressure.  This created a good channel with a good hemostasis.  Again the bladder neck was not resected nor was any tissue distal to the verumontanum.  There was some pesky mucosal oozing from right anterior and anterior this was lightly fulgurated but largely left in place.  Again hemostasis excellent and all chips evacuated.  The scope was removed and I placed a 20 [place] two-way Foley catheter with 25 cc in the balloon and seated it at the bladder neck.  Irrigation was clear.  The chips were sent to pathology.  He was awakened and taken to the cover room in stable condition.  Complications: None  Blood loss: 25 mL  Specimens: Prostate chips to pathology  Drains: 20 French two-way catheter  Disposition: Patient stable to PACU - I discussed with Eulas Post and went over the procedure, post-op care and f/u.

## 2019-05-18 NOTE — Anesthesia Postprocedure Evaluation (Signed)
Anesthesia Post Note  Patient: Nicholas Mahoney  Procedure(s) Performed: TRANSURETHRAL RESECTION OF THE PROSTATE (TURP) (N/A Prostate)     Patient location during evaluation: PACU Anesthesia Type: General Level of consciousness: awake and alert Pain management: pain level controlled Vital Signs Assessment: post-procedure vital signs reviewed and stable Respiratory status: spontaneous breathing, nonlabored ventilation, respiratory function stable and patient connected to nasal cannula oxygen Cardiovascular status: blood pressure returned to baseline and stable Postop Assessment: no apparent nausea or vomiting Anesthetic complications: no    Last Vitals:  Vitals:   05/18/19 1245 05/18/19 1300  BP: (!) 155/83 132/64  Pulse: 69 62  Resp: (!) 9 11  Temp:    SpO2: 100% 95%    Last Pain:  Vitals:   05/18/19 1300  TempSrc:   PainSc: 4                  Barnet Glasgow

## 2019-05-18 NOTE — Discharge Instructions (Signed)
Transurethral Resection of the Prostate, Care After °This sheet gives you information about how to care for yourself after your procedure. Your health care provider may also give you more specific instructions. If you have problems or questions, contact your health care provider. °What can I expect after the procedure? °After the procedure, it is common to have: °· Mild pain in your lower abdomen. °· Soreness or mild discomfort in your penis from having the catheter inserted during the procedure. °· A feeling of urgency when you need to urinate. °· A small amount of blood in your urine. You may notice some small blood clots in your urine. These are normal. °Follow these instructions at home: °Medicines °· Take over-the-counter and prescription medicines only as told by your health care provider. °· If you were prescribed an antibiotic medicine, take it as told by your health care provider. Do not stop taking the antibiotic even if you start to feel better. °· Ask your health care provider if the medicine prescribed to you: °? Requires you to avoid driving or using heavy machinery. °? Can cause constipation. You may need to take actions to prevent or treat constipation, such as: °§ Take over-the-counter or prescription medicines. °§ Eat foods that are high in fiber, such as fresh fruits and vegetables, whole grains, and beans. °§ Limit foods that are high in fat and processed sugars, such as fried or sweet foods. °· Do not drive for 24 hours if you were given a sedative during your procedure. °Activity ° °· Return to your normal activities as told by your health care provider. Ask your health care provider what activities are safe for you. °· Do not lift anything that is heavier than 10 lb (4.5 kg), or the limit that you are told, for 3 weeks after the procedure or until your health care provider says that it is safe. °· Avoid intense physical activity for as long as told by your health care provider. °· Avoid  sitting for a long time without moving. Get up and move around one or more times every few hours. This helps to prevent blood clots. You may increase your physical activity gradually as you start to feel better. °Lifestyle °· Do not drink alcohol for as long as told by your health care provider. This is especially important if you are taking prescription pain medicines. °· Do not engage in sexual activity until your health care provider says that you can do this. °General instructions ° °· Do not take baths, swim, or use a hot tub until your health care provider approves. °· Drink enough fluid to keep your urine pale yellow. °· Urinate as soon as you feel the need to. Do not try to hold your urine for long periods of time. °· If your health care provider approves, you may take a stool softener for 2-3 weeks to prevent you from straining to have a bowel movement. °· Wear compression stockings as told by your health care provider. These stockings help to prevent blood clots and reduce swelling in your legs. °· Keep all follow-up visits as told by your health care provider. This is important. °Contact a health care provider if you have: °· Difficulty urinating. °· A fever. °· Pain that gets worse or does not improve with medicine. °· Blood in your urine that does not go away after 1 week of resting and drinking more fluids. °· Swelling in your penis or testicles. °Get help right away if: °· You are unable   to urinate.  You are having more blood clots in your urine instead of fewer.  You have: ? Large blood clots. ? A lot of blood in your urine. ? Pain in your back or lower abdomen. ? Pain or swelling in your legs. ? Chills and you are shaking. ? Difficulty breathing or shortness of breath. Summary  After the procedure, it is common to have a small amount of blood in your urine.  Avoid heavy lifting and intense physical activity for as long as told by your health care provider.  Urinate as soon as you  feel the need to. Do not try to hold your urine for long periods of time.  Keep all follow-up visits as told by your health care provider. This is important. This information is not intended to replace advice given to you by your health care provider. Make sure you discuss any questions you have with your health care provider. Document Released: 06/24/2005 Document Revised: 10/14/2018 Document Reviewed: 03/25/2018 Elsevier Patient Education  [place] Instructions  Activity: Get plenty of rest for the remainder of the day. A responsible individual must stay with you for 24 hours following the procedure.  For the next 24 hours, DO NOT: -Drive a car -Paediatric nurse -Drink alcoholic beverages -Take any medication unless instructed by your physician -Make any legal decisions or sign important papers.  Meals: Start with liquid foods such as gelatin or soup. Progress to regular foods as tolerated. Avoid greasy, spicy, heavy foods. If nausea and/or vomiting occur, drink only clear liquids until the nausea and/or vomiting subsides. Call your physician if vomiting continues.  Special Instructions/Symptoms: Your throat may feel dry or sore from the anesthesia or the breathing tube placed in your throat during surgery. If this causes discomfort, gargle with warm salt water. The discomfort should disappear within 24 hours.  If you had a scopolamine patch placed behind your ear for the management of post- operative nausea and/or vomiting:  1. The medication in the patch is effective for 72 hours, after which it should be removed.  Wrap patch in a tissue and discard in the trash. Wash hands thoroughly with soap and water. 2. You may remove the patch earlier than 72 hours if you experience unpleasant side effects which may include dry mouth, dizziness or visual disturbances. 3. Avoid touching the patch. Wash your hands with soap and water after contact with the  patch.    Indwelling Urinary Catheter Care, Adult An indwelling urinary catheter is a thin tube that is put into your bladder. The tube helps to drain pee (urine) out of your body. The tube goes in through your urethra. Your urethra is where pee comes out of your body. Your pee will come out through the catheter, then it will go into a bag (drainage bag). Take good care of your catheter so it will work well. How to wear your catheter and bag Supplies needed  Sticky tape (adhesive tape) or a leg strap.  Alcohol wipe or soap and water (if you use tape).  A clean towel (if you use tape).  Large overnight bag.  Smaller bag (leg bag). Wearing your catheter Attach your catheter to your leg with tape or a leg strap.  Make sure the catheter is not pulled tight.  If a leg strap gets wet, take it off and put on a dry strap.  If you use tape to hold the bag on your leg: 1. Use an  alcohol wipe or soap and water to wash your skin where the tape made it sticky before. 2. Use a clean towel to pat-dry that skin. 3. Use new tape to make the bag stay on your leg. Wearing your bags You should have been given a large overnight bag.  You may wear the overnight bag in the day or night.  Always have the overnight bag lower than your bladder.  Do not let the bag touch the floor.  Before you go to sleep, put a clean plastic bag in a wastebasket. Then hang the overnight bag inside the wastebasket. You should also have a smaller leg bag that fits under your clothes.  Always wear the leg bag below your knee.  Do not wear your leg bag at night. How to care for your skin and catheter Supplies needed  A clean washcloth.  Water and mild soap.  A clean towel. Caring for your skin and catheter      Clean the skin around your catheter every day: ? Wash your hands with soap and water. ? Wet a clean washcloth in warm water and mild soap. ? Clean the skin around your urethra. ? If you are  male: ? Gently spread the folds of skin around your vagina (labia). ? With the washcloth in your other hand, wipe the inner side of your labia on each side. Wipe from front to back. ? If you are male: ? Pull back any skin that covers the end of your penis (foreskin). ? With the washcloth in your other hand, wipe your penis in small circles. Start wiping at the tip of your penis, then move away from the catheter. ? Move the foreskin back in place, if needed. ? With your free hand, hold the catheter close to where it goes into your body. ? Keep holding the catheter during cleaning so it does not get pulled out. ? With the washcloth in your other hand, clean the catheter. ? Only wipe downward on the catheter. ? Do not wipe upward toward your body. Doing this may push germs into your urethra and cause infection. ? Use a clean towel to pat-dry the catheter and the skin around it. Make sure to wipe off all soap. ? Wash your hands with soap and water.  Shower every day. Do not take baths.  Do not use cream, ointment, or lotion on the area where the catheter goes into your body, unless your doctor tells you to.  Do not use powders, sprays, or lotions on your genital area.  Check your skin around the catheter every day for signs of infection. Check for: ? Redness, swelling, or pain. ? Fluid or blood. ? Warmth. ? Pus or a bad smell. How to empty the bag Supplies needed  Rubbing alcohol.  Gauze pad or cotton ball.  Tape or a leg strap. Emptying the bag Pour the pee out of your bag when it is ?- full, or at least 2-3 times a day. Do this for your overnight bag and your leg bag. 1. Wash your hands with soap and water. 2. Separate (detach) the bag from your leg. 3. Hold the bag over the toilet or a clean pail. Keep the bag lower than your hips and bladder. This is so the pee (urine) does not go back into the tube. 4. Open the pour spout. It is at the bottom of the bag. 5. Empty the pee  into the toilet or pail. Do not let the pour  spout touch any surface. 6. Put rubbing alcohol on a gauze pad or cotton ball. 7. Use the gauze pad or cotton ball to clean the pour spout. 8. Close the pour spout. 9. Attach the bag to your leg with tape or a leg strap. 10. Wash your hands with soap and water. Follow instructions for cleaning the drainage bag:  From the product maker.  As told by your doctor. How to change the bag Supplies needed  Alcohol wipes.  A clean bag.  Tape or a leg strap. Changing the bag Replace your bag when it starts to leak, smell bad, or look dirty. 1. Wash your hands with soap and water. 2. Separate the dirty bag from your leg. 3. Pinch the catheter with your fingers so that pee does not spill out. 4. Separate the catheter tube from the bag tube where these tubes connect (at the connection valve). Do not let the tubes touch any surface. 5. Clean the end of the catheter tube with an alcohol wipe. Use a different alcohol wipe to clean the end of the bag tube. 6. Connect the catheter tube to the tube of the clean bag. 7. Attach the clean bag to your leg with tape or a leg strap. Do not make the bag tight on your leg. 8. Wash your hands with soap and water. General rules   Never pull on your catheter. Never try to take it out. Doing that can hurt you.  Always wash your hands before and after you touch your catheter or bag. Use a mild, fragrance-free soap. If you do not have soap and water, use hand sanitizer.  Always make sure there are no twists or bends (kinks) in the catheter tube.  Always make sure there are no leaks in the catheter or bag.  Drink enough fluid to keep your pee pale yellow.  Do not take baths, swim, or use a hot tub.  If you are male, wipe from front to back after you poop (have a bowel movement). Contact a doctor if:  Your pee is cloudy.  Your pee smells worse than usual.  Your catheter gets clogged.  Your catheter  leaks.  Your bladder feels full. Get help right away if:  You have redness, swelling, or pain where the catheter goes into your body.  You have fluid, blood, pus, or a bad smell coming from the area where the catheter goes into your body.  Your skin feels warm where the catheter goes into your body.  You have a fever.  You have pain in your: ? Belly (abdomen). ? Legs. ? Lower back. ? Bladder.  You see blood in the catheter.  Your pee is pink or red.  You feel sick to your stomach (nauseous).  You throw up (vomit).  You have chills.  Your pee is not draining into the bag.  Your catheter gets pulled out. Summary  An indwelling urinary catheter is a thin tube that is placed into the bladder to help drain pee (urine) out of the body.  The catheter is placed into the part of the body that drains pee from the bladder (urethra).  Taking good care of your catheter will keep it working properly and help prevent problems.  Always wash your hands before and after touching your catheter or bag.  Never pull on your catheter or try to take it out. This information is not intended to replace advice given to you by your health care provider. Make sure you  discuss any questions you have with your health care provider. Document Released: 10/19/2012 Document Revised: 10/16/2018 Document Reviewed: 02/07/2017 Elsevier Patient Education  2020 [employer].

## 2019-05-18 NOTE — Interval H&P Note (Signed)
History and Physical Interval Note:  05/18/2019 11:21 AM  Nicholas Mahoney  has presented today for surgery, with the diagnosis of BENIGN PROSTATIC HYPERPLASIA.  The various methods of treatment have been discussed with the patient and family. After consideration of risks, benefits and other options for treatment, the patient has consented to  Procedure(s): TRANSURETHRAL RESECTION OF THE PROSTATE (TURP) (N/A) as a surgical intervention.  The patient's history has been reviewed, patient examined, no change in status, stable for surgery.  He had another episode of hematuria the other day.  No dysuria or fever.  Discussed possible overnight stay with [facility].  Typically not needed after the prostate is already been treated with the laser.  I have reviewed the patient's chart and labs.  Questions were answered to the patient's satisfaction.     [name]

## 2019-05-18 NOTE — Transfer of Care (Signed)
Immediate Anesthesia Transfer of Care Note  Patient: Nicholas Mahoney  Procedure(s) Performed: Procedure(s) (LRB): TRANSURETHRAL RESECTION OF THE PROSTATE (TURP) (N/A)  Patient Location: PACU  Anesthesia Type: General  Level of Consciousness: awake, sedated, patient cooperative and responds to stimulation  Airway & Oxygen Therapy: Patient Spontanous Breathing and Patient connected to Ammon 02 and soft FM  Post-op Assessment: Report given to PACU RN, Post -op Vital signs reviewed and stable and Patient moving all extremities  Post vital signs: Reviewed and stable  Complications: No apparent anesthesia complications

## 2019-05-18 NOTE — Anesthesia Procedure Notes (Signed)
Procedure Name: LMA Insertion Date/Time: 05/18/2019 11:35 AM Performed by: Bonney Aid, CRNA Pre-anesthesia Checklist: Patient identified, Emergency Drugs available, Suction available and Patient being monitored Patient Re-evaluated:Patient Re-evaluated prior to induction Oxygen Delivery Method: Circle system utilized Preoxygenation: Pre-oxygenation with 100% oxygen Induction Type: IV induction Ventilation: Mask ventilation without difficulty LMA: LMA inserted LMA Size: 4.0 Number of attempts: 1 Airway Equipment and Method: Bite block Placement Confirmation: positive ETCO2 Tube secured with: Tape Dental Injury: Teeth and Oropharynx as per pre-operative assessment

## 2019-05-19 ENCOUNTER — Encounter (HOSPITAL_BASED_OUTPATIENT_CLINIC_OR_DEPARTMENT_OTHER): Payer: Self-pay | Admitting: Urology

## 2019-05-19 LAB — SURGICAL PATHOLOGY

## 2019-05-20 ENCOUNTER — Ambulatory Visit: Payer: Medicare Other | Admitting: Internal Medicine

## 2019-05-21 DIAGNOSIS — N401 Enlarged prostate with lower urinary tract symptoms: Secondary | ICD-10-CM | POA: Diagnosis not present

## 2019-05-21 DIAGNOSIS — R310 Gross hematuria: Secondary | ICD-10-CM | POA: Diagnosis not present

## 2019-05-21 DIAGNOSIS — R3912 Poor urinary stream: Secondary | ICD-10-CM | POA: Diagnosis not present

## 2019-05-31 DIAGNOSIS — M1712 Unilateral primary osteoarthritis, left knee: Secondary | ICD-10-CM | POA: Diagnosis not present

## 2019-06-02 ENCOUNTER — Telehealth: Payer: Self-pay | Admitting: *Deleted

## 2019-06-02 ENCOUNTER — Telehealth: Payer: Self-pay | Admitting: Oncology

## 2019-06-02 NOTE — Telephone Encounter (Signed)
Left VM that he was returning missed call from office and he needs to reschedule his 11/30 lab appointment. No documentation of call noted. Sent scheduling message to reschedule lab.

## 2019-06-02 NOTE — Telephone Encounter (Signed)
Left message - called pt per 11/25 sch message- unable to reach pt . Left message for pt to call back to reschedule appt

## 2019-06-07 ENCOUNTER — Inpatient Hospital Stay: Payer: Medicare Other | Attending: Oncology

## 2019-06-21 DIAGNOSIS — R310 Gross hematuria: Secondary | ICD-10-CM | POA: Diagnosis not present

## 2019-07-15 DIAGNOSIS — M25561 Pain in right knee: Secondary | ICD-10-CM | POA: Diagnosis not present

## 2019-07-15 DIAGNOSIS — M25551 Pain in right hip: Secondary | ICD-10-CM | POA: Diagnosis not present

## 2019-07-15 DIAGNOSIS — M25562 Pain in left knee: Secondary | ICD-10-CM | POA: Diagnosis not present

## 2019-07-22 DIAGNOSIS — M25561 Pain in right knee: Secondary | ICD-10-CM | POA: Diagnosis not present

## 2019-07-22 DIAGNOSIS — M25551 Pain in right hip: Secondary | ICD-10-CM | POA: Diagnosis not present

## 2019-07-22 DIAGNOSIS — M25562 Pain in left knee: Secondary | ICD-10-CM | POA: Diagnosis not present

## 2019-07-22 DIAGNOSIS — M25552 Pain in left hip: Secondary | ICD-10-CM | POA: Diagnosis not present

## 2019-07-29 DIAGNOSIS — M25561 Pain in right knee: Secondary | ICD-10-CM | POA: Diagnosis not present

## 2019-08-02 DIAGNOSIS — Z961 Presence of intraocular lens: Secondary | ICD-10-CM | POA: Diagnosis not present

## 2019-08-02 DIAGNOSIS — H52202 Unspecified astigmatism, left eye: Secondary | ICD-10-CM | POA: Diagnosis not present

## 2019-08-02 DIAGNOSIS — H2512 Age-related nuclear cataract, left eye: Secondary | ICD-10-CM | POA: Diagnosis not present

## 2019-08-03 DIAGNOSIS — H2511 Age-related nuclear cataract, right eye: Secondary | ICD-10-CM | POA: Diagnosis not present

## 2019-08-19 DIAGNOSIS — E785 Hyperlipidemia, unspecified: Secondary | ICD-10-CM | POA: Diagnosis not present

## 2019-08-19 DIAGNOSIS — M170 Bilateral primary osteoarthritis of knee: Secondary | ICD-10-CM | POA: Diagnosis not present

## 2019-08-19 DIAGNOSIS — Z8249 Family history of ischemic heart disease and other diseases of the circulatory system: Secondary | ICD-10-CM | POA: Diagnosis not present

## 2019-08-20 DIAGNOSIS — M170 Bilateral primary osteoarthritis of knee: Secondary | ICD-10-CM | POA: Diagnosis not present

## 2019-08-20 DIAGNOSIS — N400 Enlarged prostate without lower urinary tract symptoms: Secondary | ICD-10-CM | POA: Diagnosis not present

## 2019-08-20 DIAGNOSIS — M1710 Unilateral primary osteoarthritis, unspecified knee: Secondary | ICD-10-CM | POA: Diagnosis not present

## 2019-08-20 DIAGNOSIS — E785 Hyperlipidemia, unspecified: Secondary | ICD-10-CM | POA: Diagnosis not present

## 2019-08-20 DIAGNOSIS — M179 Osteoarthritis of knee, unspecified: Secondary | ICD-10-CM | POA: Diagnosis not present

## 2019-08-23 DIAGNOSIS — H2511 Age-related nuclear cataract, right eye: Secondary | ICD-10-CM | POA: Diagnosis not present

## 2019-09-07 ENCOUNTER — Encounter: Payer: Self-pay | Admitting: Internal Medicine

## 2019-09-07 ENCOUNTER — Other Ambulatory Visit: Payer: Self-pay

## 2019-09-07 ENCOUNTER — Ambulatory Visit (INDEPENDENT_AMBULATORY_CARE_PROVIDER_SITE_OTHER): Payer: Medicare Other | Admitting: Internal Medicine

## 2019-09-07 VITALS — BP 120/72 | HR 78 | Temp 97.8°F | Ht 73.0 in | Wt 173.0 lb

## 2019-09-07 DIAGNOSIS — E7800 Pure hypercholesterolemia, unspecified: Secondary | ICD-10-CM | POA: Diagnosis not present

## 2019-09-07 DIAGNOSIS — I700 Atherosclerosis of aorta: Secondary | ICD-10-CM | POA: Diagnosis not present

## 2019-09-07 DIAGNOSIS — E291 Testicular hypofunction: Secondary | ICD-10-CM | POA: Diagnosis not present

## 2019-09-07 DIAGNOSIS — Z79899 Other long term (current) drug therapy: Secondary | ICD-10-CM | POA: Diagnosis not present

## 2019-09-07 NOTE — Progress Notes (Signed)
This visit occurred during the SARS-CoV-2 public health emergency.  Safety protocols were in place, including screening questions prior to the visit, additional usage of staff PPE, and extensive cleaning of exam room while observing appropriate contact time as indicated for disinfecting solutions.  Subjective:     Patient ID: Nicholas Mahoney , male    DOB: 02-Jan-1946 , 74 y.o.   MRN: [id number]   Chief Complaint  Patient presents with  . hormone    HPI  He is here today for f/u hypogonadism. He has been using topical compounded testosterone without any issues. He feels well. He has no specific concerns at this time.     Past Medical History:  Diagnosis Date  . Bladder outlet obstruction   . BPH (benign prostatic hyperplasia)   . ED (erectile dysfunction)   . Hyperlipidemia   . Lower urinary tract symptoms (LUTS)   . Migraines    none recent  . 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 OA (osteoarthritis) of knee   . Vitamin D deficiency   . Wears glasses   . Wears hearing aid    bilateral     Family History  Problem Relation Age of Onset  . Cancer Mother   . Diabetes Mother   . Coronary artery disease Mother   . Cancer Father   . Heart attack Father   . Coronary artery disease Brother   . Diabetes Brother      Current Outpatient Medications:  .  aspirin 81 MG tablet, Take 1 tablet (81 mg total) by mouth daily., Disp: 30 tablet, Rfl:  .  Cholecalciferol (VITAMIN D3) 2000 UNITS TABS, Take 1 tablet by mouth daily., Disp: , Rfl:  .  diclofenac (VOLTAREN) 75 MG EC tablet, TAKE 1 TABLET BY MOUTH TWICE A DAY AFTER MEALS FOR INFLAMMATION, PAIN, OR SWELLING, Disp: , Rfl:  .  Diclofenac Sodium (VOLTAREN EX), Apply topically., Disp: , Rfl:  .  hyoscyamine (LEVSIN SL) 0.125 MG SL tablet, DISSOLVE 1 TABLET UNDER TONGUE 4 TIMES A DAY AS NEEDED, Disp: , Rfl:  .  Misc Natural Products (PROGESTERONE EX), Apply topically., Disp: , Rfl:  .  simvastatin (ZOCOR) 20 MG tablet,  every evening., Disp: , Rfl: 1 .  zolpidem (AMBIEN) 10 MG tablet, 1 TABLET AT BEDTIME AS NEEDED FOR SLEEP ORALLY, Disp: , Rfl: 0   No Known Allergies   Review of Systems  Constitutional: Negative.   Respiratory: Negative.   Cardiovascular: Negative.   Gastrointestinal: Negative.   Neurological: Negative.   Psychiatric/Behavioral: Negative.      Today's Vitals   09/07/19 1049  BP: 120/72  Pulse: 78  Temp: 97.8 F (36.6 C)  TempSrc: Oral  SpO2: 93%  Weight: 173 lb (78.5 kg)  Height: 6\' 1"  (1.854 m)   Body mass index is 22.82 kg/m.   Objective:  Physical Exam Vitals and nursing note reviewed.  Constitutional:      Appearance: Normal appearance.  Cardiovascular:     Rate and Rhythm: Normal rate and regular rhythm.     Heart sounds: Normal heart sounds.  Pulmonary:     Effort: Pulmonary effort is normal.     Breath sounds: Normal breath sounds.  Skin:    General: Skin is warm.  Neurological:     General: No focal deficit present.     Mental Status: He is alert.  Psychiatric:        Mood and Affect: Mood normal.  Assessment And Plan:     1. 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 I will check labs as listed below. I plan to check saliva testing later in the year.   - Testosterone,Free and Total  2. Hereditary hemochromatosis (HCC)  Chronic, yet stable. Recent hematology notes reviewed in full detail during his visit.   3. Aortic atherosclerosis (HCC)  Chronic, yet stable. Seen on imaging. Advised to comply with statin therapy.   4. Pure hypercholesterolemia  Chronic, he will continue with simvastatin. Encouraged to avoid fried foods and exercise at least 30 minutes five days per week.   5. Drug therapy  Recent CBC results from hematology reviewed in full detail.   - Liver Profile   I personally spent 30 minutes face-to-face and non-face-to-face in the care of this patient, which includes all pre-, intra-, and post visit time on the date of  service.   Maximino Greenland, MD    THE PATIENT IS ENCOURAGED TO PRACTICE SOCIAL DISTANCING DUE TO THE COVID-19 PANDEMIC.

## 2019-09-09 LAB — TESTOSTERONE,FREE AND TOTAL
Testosterone, Free: 9.2 pg/mL (ref 6.6–18.1)
Testosterone: 351 ng/dL (ref 264–916)

## 2019-09-09 LAB — HEPATIC FUNCTION PANEL
ALT: 49 IU/L — ABNORMAL HIGH (ref 0–44)
AST: 57 IU/L — ABNORMAL HIGH (ref 0–40)
Albumin: 4.6 g/dL (ref 3.7–4.7)
Alkaline Phosphatase: 80 IU/L (ref 39–117)
Bilirubin Total: 0.7 mg/dL (ref 0.0–1.2)
Bilirubin, Direct: 0.15 mg/dL (ref 0.00–0.40)
Total Protein: 6.9 g/dL (ref 6.0–8.5)

## 2019-09-15 ENCOUNTER — Ambulatory Visit (INDEPENDENT_AMBULATORY_CARE_PROVIDER_SITE_OTHER): Payer: Medicare Other | Admitting: Family Medicine

## 2019-09-15 ENCOUNTER — Encounter: Payer: Self-pay | Admitting: Cardiology

## 2019-09-15 ENCOUNTER — Other Ambulatory Visit: Payer: Self-pay

## 2019-09-15 VITALS — BP 132/66 | HR 63 | Ht 73.0 in | Wt 172.0 lb

## 2019-09-15 DIAGNOSIS — Z8249 Family history of ischemic heart disease and other diseases of the circulatory system: Secondary | ICD-10-CM | POA: Diagnosis not present

## 2019-09-15 DIAGNOSIS — I700 Atherosclerosis of aorta: Secondary | ICD-10-CM

## 2019-09-15 DIAGNOSIS — E782 Mixed hyperlipidemia: Secondary | ICD-10-CM

## 2019-09-15 NOTE — Patient Instructions (Signed)
Medication Instructions:  Your physician recommends that you continue on your current medications as directed. Please refer to the Current Medication list given to you today.  *If you need a refill on your cardiac medications before your next appointment, please call your pharmacy*   Lab Work: None ordered   If you have labs (blood work) drawn today and your tests are completely normal, you will receive your results only by: Marland Kitchen MyChart Message (if you have MyChart) OR . A paper copy in the mail If you have any lab test that is abnormal or we need to change your treatment, we will call you to review the results.   Testing/Procedures: None ordered    Follow-Up: At [facility], you and your health needs are our priority.  As part of our continuing mission to provide you with exceptional heart care, we have created designated Provider Care Teams.  These Care Teams include your primary Cardiologist (physician) and Advanced Practice Providers (APPs -  Physician Assistants and Nurse Practitioners) who all work together to provide you with the care you need, when you need it.  Your next appointment:   9 month(s)  The format for your next appointment:   In Person  Provider:   You may see Candee Furbish, MD  or one of the following Advanced Practice Providers on your designated Care Team:    Truitt Merle, NP  Cecilie Kicks, NP  Kathyrn Drown, NP    Other Instructions We recommend signing up for the patient portal called "MyChart".  Sign up information is provided on this After Visit Summary.  MyChart is used to connect with patients for Virtual Visits (Telemedicine).  Patients are able to view lab/test results, encounter notes, upcoming appointments, etc.  Non-urgent messages can be sent to your provider as well.   To learn more about what you can do with MyChart, go to [email or web address].

## 2019-09-15 NOTE — Progress Notes (Signed)
Cardiology Office Note  Date: 09/15/2019   ID: HUBBERT DEGRAW, DOB 1945-09-13, MRN [id number]  PCP:  [name], MD  Cardiologist:  No primary care provider on file. Electrophysiologist:  None   Chief Complaint: Follow-up for atypical chest pain, family history of coronary artery disease, hyperlipidemia.  History of Present Illness: Nicholas Mahoney is a 74 y.o. male with a history hyperlipidemia, hereditary hemochromatosis, abnormal stress with subsequent cardiac catheterization which showed no significant coronary artery disease with normal ejection fraction.  Has a strong family history of heart disease in his mother who died at age 83 from an MI and brother with MI at age 75.  Brother recently had 3 valves replaced.  History of minimally elevated AST at 39.  Had a recent TURP 05/18/2019 Dr Junious Silk.  Patient states he had some postop hematuria but it has recently resolved.  Patient is very active on a daily basis.  He watches what he eats and exercises.  He travels extensively and works out several times a week.  He does complain of bilateral hip and knee arthritis which bothers him more than anything else.  He has hereditary hemochromatosis requiring periodic phlebotomies.  Recent ferritin levels were 47.  He denies any progressive anginal or exertional symptoms, palpitations or arrhythmias, orthostatic symptoms, stroke or TIA-like symptoms, blood in stool or urine.  Claudication-like symptoms, DVT or PE-like symptoms, or lower extremity edema.    He is concerned about further imaging modalities and when and how she they should be done and under what circumstances.  We discussed the various modalities such as coronary CTA with FFR.  Cardiac MRIs.  He has had a previous nuclear stress test which showed an abnormality causing him to undergo a cardiac catheterization which showed normal coronaries back in 2009.  He is concerned about his heart due to first-degree relatives with  early heart disease.  He has had 1 brother with history of MI and valve replacement.  Mother with early heart disease.   Past Medical History:  Diagnosis Date  . Bladder outlet obstruction   . BPH (benign prostatic hyperplasia)   . ED (erectile dysfunction)   . Hyperlipidemia   . Lower urinary tract symptoms (LUTS)   . Migraines    none recent  . 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 OA (osteoarthritis) of knee   . Vitamin D deficiency   . Wears glasses   . Wears hearing aid    bilateral    Past Surgical History:  Procedure Laterality Date  . APPENDECTOMY  1980's  . CARDIAC CATHETERIZATION  01-26-2008  dr Marlou Porch   abnormal myoview/  no sig. cad,  normal LVF,  ef 60%,  false positive myoview  . GREEN LIGHT LASER TURP (TRANSURETHRAL RESECTION OF PROSTATE N/A 06/13/2015   Procedure: GREEN LIGHT LASER TURP (TRANSURETHRAL RESECTION OF PROSTATE;  Surgeon: [name], MD;  Location: [facility];  Service: Urology;  Laterality: N/A;  . HERNIA REPAIR  infant  . KNEE ARTHROSCOPY W/ MENISCECTOMY Bilateral left  10-04-2008/  right 11-09-2007   and chondraplasty/  synovectomy  . TRANSURETHRAL RESECTION OF PROSTATE N/A 05/18/2019   Procedure: TRANSURETHRAL RESECTION OF THE PROSTATE (TURP);  Surgeon: [name], MD;  Location: [facility];  Service: Urology;  Laterality: N/A;  . VASECTOMY  1980's    Current Outpatient Medications  Medication Sig Dispense Refill  . aspirin 81 MG tablet Take 1 tablet (81 mg total) by mouth daily.  30 tablet   . Cholecalciferol (VITAMIN D3) 2000 UNITS TABS Take 1 tablet by mouth daily.    . diclofenac (VOLTAREN) 75 MG EC tablet TAKE 1 TABLET BY MOUTH TWICE A DAY AFTER MEALS FOR INFLAMMATION, PAIN, OR SWELLING    . Diclofenac Sodium (VOLTAREN EX) Apply topically.    . hyoscyamine (LEVSIN SL) 0.125 MG SL tablet DISSOLVE 1 TABLET UNDER TONGUE 4 TIMES A DAY AS NEEDED    . Misc Natural Products (PROGESTERONE EX) Apply  topically.    . simvastatin (ZOCOR) 20 MG tablet every evening.  1  . zolpidem (AMBIEN) 10 MG tablet 1 TABLET AT BEDTIME AS NEEDED FOR SLEEP ORALLY  0   No current facility-administered medications for this visit.   Allergies:  Patient has no known allergies.   Social History: The patient  reports that he has never smoked. He has never used smokeless tobacco. He reports current alcohol use of about 1.0 - 2.0 standard drinks of alcohol per week. He reports that he does not use drugs.   Family History: The patient's family history includes Cancer in his father and mother; Coronary artery disease in his brother and mother; Diabetes in his brother and mother; Heart attack in his father.   ROS:  Please see the history of present illness. Otherwise, complete review of systems is positive for none.  All other systems are reviewed and negative.   Physical Exam: VS:  There were no vitals taken for this visit., BMI There is no height or weight on file to calculate BMI.  Wt Readings from Last 3 Encounters:  09/07/19 173 lb (78.5 kg)  05/18/19 168 lb 3 oz (76.3 kg)  05/11/19 169 lb 12.8 oz (77 kg)    General: Patient appears comfortable at rest. Neck: Supple, no elevated JVP or carotid bruits, no thyromegaly. Lungs: Clear to auscultation, nonlabored breathing at rest. Cardiac: Regular rate and rhythm, no S3 or significant systolic murmur, no pericardial rub. Extremities: No pitting edema, distal pulses 2+. Skin: Warm and dry. Musculoskeletal: No kyphosis. Neuropsychiatric: Alert and oriented x3, affect grossly appropriate.  ECG:  An ECG dated 09/15/2019 was personally reviewed today and demonstrated:  Normal sinus rhythm rate of 63, no acute ST or T wave abnormalities.  Normal axis and no hypertrophy noted  Recent Labwork: 10/19/2018: BUN 21; Creatinine 0.79; Potassium 4.5; Sodium 140 04/12/2019: Hemoglobin 14.2; Platelet Count 203 09/07/2019: ALT 49; AST 57     Component Value Date/Time   CHOL  136 09/09/2018 0000   TRIG 84 09/09/2018 0000   HDL 89 (A) 09/09/2018 0000   CHOLHDL 3 05/10/2013 0744   VLDL 13.6 05/10/2013 0744   LDLCALC 72 09/09/2018 0000    Other Studies Reviewed Today: Echocardiogram 08/30/2016 Study Conclusions   - Left ventricle: The cavity size was normal. Wall thickness was  normal. Systolic function was normal. The estimated ejection  fraction was in the range of 55% to 60%. Wall motion was normal;  there were no regional wall motion abnormalities. Doppler  parameters are consistent with abnormal left ventricular  relaxation (grade 1 diastolic dysfunction).  - Right ventricle: The cavity size was mildly dilated.  - Right atrium: The atrium was mildly dilated.   Impressions:  - Normal LV systolic function; grade 1 diastolic dysfunction; mild  RAE and RVE; mild TR.   Assessment and Plan:  1. Mixed hyperlipidemia   2. Hereditary hemochromatosis ([place])   3. Family history of coronary artery disease   4. Aortic atherosclerosis ([place])  1. Mixed hyperlipidemia Lipid panel 09/09/2018 TC 136 , TRIG 84, HDL 89, LDL 72.  Continue simvastatin 20 mg daily.  Continue aspirin 81 mg daily  2. Hereditary hemochromatosis ([place]) Has periodic phlebotomies. Ferritin 47 last May .  States he has a pending appointment with hematology oncology for follow-up.  3. Family history of coronary artery disease Family history of early heart disease in his mother and brother.  Both had MIs at early ages.  Patient denies any recent progressive anginal or exertional symptoms.  Continue aspirin.  Patient is interested in getting a follow-up study to reexamine for heart disease.  He has had a previous abnormal stress Myoview.  He expressed interest and a cardiac CT or MRI in the future to evaluate.  Discussed the fact that insurance may not pay for diagnostic without substantial medical necessity.   4. Aortic atherosclerosis ([place]) On abdominal MRI previously.  This was an  incidental finding on MRI in 2018 for gross hematuria and complex renal cystic lesion.  Medication Adjustments/Labs and Tests Ordered: Current medicines are reviewed at length with the patient today.  Concerns regarding medicines are outlined above.   Disposition: Follow-up with Dr Marlou Porch in 9 months.  Signed, Levell July, NP 09/15/2019 10:45 AM    [place]

## 2019-09-20 ENCOUNTER — Inpatient Hospital Stay: Payer: Medicare Other | Attending: Oncology

## 2019-09-20 ENCOUNTER — Other Ambulatory Visit: Payer: Self-pay

## 2019-09-20 ENCOUNTER — Telehealth: Payer: Self-pay | Admitting: *Deleted

## 2019-09-20 LAB — CBC WITH DIFFERENTIAL (CANCER CENTER ONLY)
Abs Immature Granulocytes: 0.02 10*3/uL (ref 0.00–0.07)
Basophils Absolute: 0.1 10*3/uL (ref 0.0–0.1)
Basophils Relative: 1 %
Eosinophils Absolute: 0.2 10*3/uL (ref 0.0–0.5)
Eosinophils Relative: 3 %
HCT: 45.1 % (ref 39.0–52.0)
Hemoglobin: 15 g/dL (ref 13.0–17.0)
Immature Granulocytes: 0 %
Lymphocytes Relative: 33 %
Lymphs Abs: 2.4 10*3/uL (ref 0.7–4.0)
MCH: 31.9 pg (ref 26.0–34.0)
MCHC: 33.3 g/dL (ref 30.0–36.0)
MCV: 96 fL (ref 80.0–100.0)
Monocytes Absolute: 0.6 10*3/uL (ref 0.1–1.0)
Monocytes Relative: 9 %
Neutro Abs: 4 10*3/uL (ref 1.7–7.7)
Neutrophils Relative %: 54 %
Platelet Count: 239 10*3/uL (ref 150–400)
RBC: 4.7 MIL/uL (ref 4.22–5.81)
RDW: 12.1 % (ref 11.5–15.5)
WBC Count: 7.3 10*3/uL (ref 4.0–10.5)
nRBC: 0 % (ref 0.0–0.2)

## 2019-09-20 LAB — FERRITIN: Ferritin: 32 ng/mL (ref 24–336)

## 2019-09-20 NOTE — Telephone Encounter (Signed)
Notified of message below

## 2019-09-20 NOTE — Telephone Encounter (Signed)
-----   Message from Ladell Pier, MD sent at 09/20/2019  4:23 PM EDT ----- Please call patient, ferritin is in goal range, follow-up as scheduled

## 2019-10-20 DIAGNOSIS — R3912 Poor urinary stream: Secondary | ICD-10-CM | POA: Diagnosis not present

## 2019-10-20 DIAGNOSIS — N401 Enlarged prostate with lower urinary tract symptoms: Secondary | ICD-10-CM | POA: Diagnosis not present

## 2019-10-20 DIAGNOSIS — R310 Gross hematuria: Secondary | ICD-10-CM | POA: Diagnosis not present

## 2019-10-21 ENCOUNTER — Ambulatory Visit: Payer: Medicare Other | Admitting: Cardiology

## 2019-10-28 DIAGNOSIS — M1711 Unilateral primary osteoarthritis, right knee: Secondary | ICD-10-CM | POA: Diagnosis not present

## 2019-10-28 DIAGNOSIS — M170 Bilateral primary osteoarthritis of knee: Secondary | ICD-10-CM | POA: Diagnosis not present

## 2019-10-28 DIAGNOSIS — M25561 Pain in right knee: Secondary | ICD-10-CM | POA: Diagnosis not present

## 2019-10-28 DIAGNOSIS — M1712 Unilateral primary osteoarthritis, left knee: Secondary | ICD-10-CM | POA: Diagnosis not present

## 2019-10-28 DIAGNOSIS — M25562 Pain in left knee: Secondary | ICD-10-CM | POA: Diagnosis not present

## 2019-11-02 DIAGNOSIS — H903 Sensorineural hearing loss, bilateral: Secondary | ICD-10-CM | POA: Diagnosis not present

## 2020-01-11 ENCOUNTER — Ambulatory Visit: Payer: Medicare Other | Admitting: Internal Medicine

## 2020-01-20 ENCOUNTER — Inpatient Hospital Stay: Payer: Medicare Other | Attending: Oncology | Admitting: Oncology

## 2020-01-20 ENCOUNTER — Other Ambulatory Visit: Payer: Self-pay

## 2020-01-20 ENCOUNTER — Inpatient Hospital Stay: Payer: Medicare Other

## 2020-01-20 ENCOUNTER — Telehealth: Payer: Self-pay

## 2020-01-20 ENCOUNTER — Telehealth: Payer: Self-pay | Admitting: Oncology

## 2020-01-20 DIAGNOSIS — M1710 Unilateral primary osteoarthritis, unspecified knee: Secondary | ICD-10-CM | POA: Diagnosis not present

## 2020-01-20 DIAGNOSIS — N400 Enlarged prostate without lower urinary tract symptoms: Secondary | ICD-10-CM | POA: Diagnosis not present

## 2020-01-20 DIAGNOSIS — R42 Dizziness and giddiness: Secondary | ICD-10-CM | POA: Insufficient documentation

## 2020-01-20 DIAGNOSIS — I8393 Asymptomatic varicose veins of bilateral lower extremities: Secondary | ICD-10-CM | POA: Insufficient documentation

## 2020-01-20 DIAGNOSIS — R251 Tremor, unspecified: Secondary | ICD-10-CM | POA: Diagnosis not present

## 2020-01-20 LAB — CBC WITH DIFFERENTIAL (CANCER CENTER ONLY)
Abs Immature Granulocytes: 0.03 10*3/uL (ref 0.00–0.07)
Basophils Absolute: 0.1 10*3/uL (ref 0.0–0.1)
Basophils Relative: 1 %
Eosinophils Absolute: 0 10*3/uL (ref 0.0–0.5)
Eosinophils Relative: 0 %
HCT: 45.1 % (ref 39.0–52.0)
Hemoglobin: 15.5 g/dL (ref 13.0–17.0)
Immature Granulocytes: 0 %
Lymphocytes Relative: 29 %
Lymphs Abs: 2.9 10*3/uL (ref 0.7–4.0)
MCH: 32.9 pg (ref 26.0–34.0)
MCHC: 34.4 g/dL (ref 30.0–36.0)
MCV: 95.8 fL (ref 80.0–100.0)
Monocytes Absolute: 0.8 10*3/uL (ref 0.1–1.0)
Monocytes Relative: 9 %
Neutro Abs: 6 10*3/uL (ref 1.7–7.7)
Neutrophils Relative %: 61 %
Platelet Count: 213 10*3/uL (ref 150–400)
RBC: 4.71 MIL/uL (ref 4.22–5.81)
RDW: 11.9 % (ref 11.5–15.5)
WBC Count: 9.9 10*3/uL (ref 4.0–10.5)
nRBC: 0 % (ref 0.0–0.2)

## 2020-01-20 LAB — FERRITIN: Ferritin: 33 ng/mL (ref 24–336)

## 2020-01-20 NOTE — Telephone Encounter (Signed)
-----   Message from Ladell Pier, MD sent at 01/20/2020  2:00 PM EDT ----- Please call patient, ferritin is in goal range, no need for phlebotomy, follow-up as scheduled

## 2020-01-20 NOTE — Progress Notes (Signed)
  [place] OFFICE PROGRESS NOTE   Diagnosis: Hereditary hemochromatosis  INTERVAL HISTORY:   [name] returns as scheduled.  He last had phlebotomy in October 2020.  He reports intermittent dizziness, mild tremors, and memory difficulty.  He is concerned he may have Parkinson's disease.  He plans to see Dr. Leonides Schanz soon.  Objective:  Vital signs in last 24 hours:  Blood pressure 132/68, pulse 68, temperature 97.9 F (36.6 C), temperature source Temporal, resp. rate 18, height 6\' 1"  (1.854 m), weight 174 lb 1.6 oz (79 kg), SpO2 98 %.    Resp: End inspiratory bronchial sounds at the posterior chest bilaterally, no respiratory distress Cardio: Regular rate and rhythm GI: No hepatosplenomegaly Vascular: No leg edema, bilateral lower extremity varicosities Neuro: Mild resting tremor of the hands, alert and oriented     Lab Results:  Lab Results  Component Value Date   WBC 9.9 01/20/2020   HGB 15.5 01/20/2020   HCT 45.1 01/20/2020   MCV 95.8 01/20/2020   PLT 213 01/20/2020   NEUTROABS 6.0 01/20/2020    09/20/2019: Ferritin-32  Medications: I have reviewed the patient's current medications.   Assessment/Plan: 1. 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 MRI abdomen 11/05/2016 consistent with liver iron deposition ? Elevated ferritin 03/18/2017(446) ? Phlebotomy 04/10/2017, 04/17/2017, 05/13/2017, 05/20/2017, 06/05/2017,07/15/2017, 07/29/2017,08/13/2007, 08/26/2017, 09/30/2017, 10/14/2017, 10/27/2017, 03/30/2019, 04/12/2019  2.BPH, status post TUR  3.History of positional vertigo  4.Bilateral hearing loss 5.   Knee arthritis    Disposition: [name] has hereditary hemochromatosis.  He last underwent phlebotomy in October 2020.  We will follow up on the ferritin level from today and resume phlebotomy therapy as indicated.  He will return for a lab visit in 4 months and an office visit in 8 months.  He plans to  schedule bilateral knee replacement surgery.  He could have a significant blood loss and "phlebotomy effect "with these procedures.  He will follow-up with Dr. Leonides Schanz to evaluate the neurologic symptoms.  Nicholas Coder, MD  01/20/2020  12:41 PM

## 2020-01-20 NOTE — Telephone Encounter (Signed)
Called and informed patient of ferritin level. Patient verbalized understanding.

## 2020-01-20 NOTE — Telephone Encounter (Signed)
Scheduled per 07/15 los, patient received updated calender.

## 2020-01-24 DIAGNOSIS — M1712 Unilateral primary osteoarthritis, left knee: Secondary | ICD-10-CM | POA: Insufficient documentation

## 2020-01-24 HISTORY — DX: Unilateral primary osteoarthritis, left knee: M17.12

## 2020-02-01 DIAGNOSIS — R2689 Other abnormalities of gait and mobility: Secondary | ICD-10-CM | POA: Diagnosis not present

## 2020-02-01 DIAGNOSIS — Z0000 Encounter for general adult medical examination without abnormal findings: Secondary | ICD-10-CM | POA: Diagnosis not present

## 2020-02-01 DIAGNOSIS — G479 Sleep disorder, unspecified: Secondary | ICD-10-CM | POA: Diagnosis not present

## 2020-02-01 DIAGNOSIS — N401 Enlarged prostate with lower urinary tract symptoms: Secondary | ICD-10-CM | POA: Diagnosis not present

## 2020-02-01 DIAGNOSIS — I700 Atherosclerosis of aorta: Secondary | ICD-10-CM | POA: Diagnosis not present

## 2020-02-01 DIAGNOSIS — Z1389 Encounter for screening for other disorder: Secondary | ICD-10-CM | POA: Diagnosis not present

## 2020-02-01 DIAGNOSIS — E785 Hyperlipidemia, unspecified: Secondary | ICD-10-CM | POA: Diagnosis not present

## 2020-02-01 DIAGNOSIS — R413 Other amnesia: Secondary | ICD-10-CM | POA: Diagnosis not present

## 2020-02-01 DIAGNOSIS — E559 Vitamin D deficiency, unspecified: Secondary | ICD-10-CM | POA: Diagnosis not present

## 2020-02-01 DIAGNOSIS — G250 Essential tremor: Secondary | ICD-10-CM | POA: Diagnosis not present

## 2020-02-01 DIAGNOSIS — M170 Bilateral primary osteoarthritis of knee: Secondary | ICD-10-CM | POA: Diagnosis not present

## 2020-03-01 DIAGNOSIS — Z23 Encounter for immunization: Secondary | ICD-10-CM | POA: Diagnosis not present

## 2020-03-08 DIAGNOSIS — Z23 Encounter for immunization: Secondary | ICD-10-CM | POA: Diagnosis not present

## 2020-03-25 DIAGNOSIS — S90425A Blister (nonthermal), left lesser toe(s), initial encounter: Secondary | ICD-10-CM | POA: Diagnosis not present

## 2020-03-25 DIAGNOSIS — X58XXXA Exposure to other specified factors, initial encounter: Secondary | ICD-10-CM | POA: Diagnosis not present

## 2020-04-05 DIAGNOSIS — Z85828 Personal history of other malignant neoplasm of skin: Secondary | ICD-10-CM | POA: Diagnosis not present

## 2020-04-05 DIAGNOSIS — L578 Other skin changes due to chronic exposure to nonionizing radiation: Secondary | ICD-10-CM | POA: Diagnosis not present

## 2020-04-05 DIAGNOSIS — D225 Melanocytic nevi of trunk: Secondary | ICD-10-CM | POA: Diagnosis not present

## 2020-04-05 DIAGNOSIS — L814 Other melanin hyperpigmentation: Secondary | ICD-10-CM | POA: Diagnosis not present

## 2020-04-05 DIAGNOSIS — L570 Actinic keratosis: Secondary | ICD-10-CM | POA: Diagnosis not present

## 2020-04-05 DIAGNOSIS — B353 Tinea pedis: Secondary | ICD-10-CM | POA: Diagnosis not present

## 2020-04-05 DIAGNOSIS — L089 Local infection of the skin and subcutaneous tissue, unspecified: Secondary | ICD-10-CM | POA: Diagnosis not present

## 2020-04-05 DIAGNOSIS — L821 Other seborrheic keratosis: Secondary | ICD-10-CM | POA: Diagnosis not present

## 2020-04-10 DIAGNOSIS — M25661 Stiffness of right knee, not elsewhere classified: Secondary | ICD-10-CM | POA: Diagnosis not present

## 2020-04-10 DIAGNOSIS — M1711 Unilateral primary osteoarthritis, right knee: Secondary | ICD-10-CM | POA: Diagnosis not present

## 2020-04-10 DIAGNOSIS — M25561 Pain in right knee: Secondary | ICD-10-CM | POA: Diagnosis not present

## 2020-04-18 DIAGNOSIS — Z20822 Contact with and (suspected) exposure to covid-19: Secondary | ICD-10-CM | POA: Diagnosis not present

## 2020-05-04 DIAGNOSIS — N401 Enlarged prostate with lower urinary tract symptoms: Secondary | ICD-10-CM | POA: Diagnosis not present

## 2020-05-04 DIAGNOSIS — E785 Hyperlipidemia, unspecified: Secondary | ICD-10-CM | POA: Diagnosis not present

## 2020-05-04 DIAGNOSIS — M170 Bilateral primary osteoarthritis of knee: Secondary | ICD-10-CM | POA: Diagnosis not present

## 2020-05-05 DIAGNOSIS — R350 Frequency of micturition: Secondary | ICD-10-CM | POA: Diagnosis not present

## 2020-05-05 DIAGNOSIS — Z01818 Encounter for other preprocedural examination: Secondary | ICD-10-CM | POA: Diagnosis not present

## 2020-05-05 DIAGNOSIS — M1711 Unilateral primary osteoarthritis, right knee: Secondary | ICD-10-CM | POA: Diagnosis not present

## 2020-05-05 DIAGNOSIS — I700 Atherosclerosis of aorta: Secondary | ICD-10-CM | POA: Diagnosis not present

## 2020-05-05 DIAGNOSIS — E785 Hyperlipidemia, unspecified: Secondary | ICD-10-CM | POA: Diagnosis not present

## 2020-05-05 DIAGNOSIS — Z79899 Other long term (current) drug therapy: Secondary | ICD-10-CM | POA: Diagnosis not present

## 2020-05-08 ENCOUNTER — Encounter: Payer: Self-pay | Admitting: Counselor

## 2020-05-08 ENCOUNTER — Ambulatory Visit (INDEPENDENT_AMBULATORY_CARE_PROVIDER_SITE_OTHER): Payer: Medicare Other | Admitting: Counselor

## 2020-05-08 ENCOUNTER — Other Ambulatory Visit: Payer: Self-pay

## 2020-05-08 ENCOUNTER — Ambulatory Visit: Payer: Medicare Other | Admitting: Psychology

## 2020-05-08 DIAGNOSIS — R413 Other amnesia: Secondary | ICD-10-CM

## 2020-05-08 DIAGNOSIS — F09 Unspecified mental disorder due to known physiological condition: Secondary | ICD-10-CM

## 2020-05-08 NOTE — Progress Notes (Signed)
° °  Psychometrist Note   Cognitive testing was administered to Nicholas Mahoney by Milana Kidney, B.S. (Technician) under the supervision of Alphonzo Severance, Psy.D., ABN. Nicholas Mahoney was able to tolerate all test procedures. Dr. Nicole Kindred met with the patient as needed to manage any emotional reactions to the testing procedures. Rest breaks were offered.    The battery of tests administered was selected by Dr. Nicole Kindred with consideration to the patient's current level of functioning, the nature of his symptoms, emotional and behavioral responses during the interview, level of literacy, observed level of motivation/effort, and the nature of the referral question. This battery was communicated to the psychometrist. Communication between Dr. Nicole Kindred and the psychometrist was ongoing throughout the evaluation and Dr. Nicole Kindred was immediately accessible at all times. Dr. Nicole Kindred provided supervision to the technician on the date of this service, to the extent necessary to assure the quality of all services provided.    Nicholas Mahoney will return in approximately one week for an interactive feedback session with Dr. Nicole Kindred, at which time test performance, clinical impressions, and treatment recommendations will be reviewed in detail. The patient understands he can contact our office should he require our assistance before this time.   A total of 120 minutes of billable time were spent with Nicholas Mahoney by the technician, including test administration and scoring time. Billing for these services is reflected in Dr. Les Pou note.   This note reflects time spent with the psychometrician and does not include test scores, clinical history, or any interpretations made by Dr. Nicole Kindred. The full report will follow in a separate note.
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
## 2020-05-09 NOTE — Progress Notes (Signed)
[facility]  Patient Name: Nicholas Mahoney MRN: [id number] Date of Birth: August 26, 1945 Age: 74 y.o. Education: 18 years  Measurement properties of test scores: IQ, Index, and Standard Scores (SS): Mean = 100; Standard Deviation = 15 Scaled Scores (Ss): Mean = 10; Standard Deviation = 3 Z scores (Z): Mean = 0; Standard Deviation = 1 T scores (T); Mean = 50; Standard Deviation = 10  TEST SCORES:    Note: This summary of test scores accompanies the interpretive report and should not be interpreted by unqualified individuals or in isolation without reference to the report. Test scores are relative to age, gender, and educational history as available and appropriate.   Performance Validity        "A" Random Letter Test Raw  Descriptor      Errors 0 Within Expectation  The Dot Counting Test: 7 Within Expectation      Mental Status Screening     Total Score Descriptor  MoCA 25 Normal      Expected Functioning        Wide Range Achievement Test: Standard/Scaled Score Percentile      Word Reading 112 79      Reynolds Intellectual Screening Test Standard/T-score Percentile      Guess What 57 75      Odd Item Out 68 96  RIST Index 124 95      Attention/Processing Speed        Neuropsychological Assessment Battery (Attention Module, Form 1): Scaled/T-score Percentile     Digits Forward 69 97     Digits Backwards 61 86      Repeatable Battery for the Assessment of Neuropsychological Status (Form A): Standard Score Percentile     Coding 8 25      Language        Neuropsychological Assessment Battery (Language Module, Form 1): T-score Percentile      Naming   (31) 57 75      Verbal Fluency:  T Score Percentile      Controlled Oral Word Association (F-A-S) 41 18      Semantic Fluency (Animals) 56 73      Memory:        Neuropsychological Assessment Battery (Memory Module, Form 1): T-score/Standard Score Percentile  Memory Index  (MEM): 105 63      List Learning           List A Immediate Recall   (6 , 3 , 8) 38 12         List B Immediate Recall   (7) 64 92         List A Short Delayed Recall   (5) 39 14         List A Long Delayed Recall   (4) 35 7         List A Percent Retention   (80 %) --- 34         List A Long Delayed Yes/No Recognition Hits   (12) --- 88         List A Long Delayed Yes/No Recognition False Alarms   (9) --- 9         List A Recognition Discriminability Index --- 14      Shape Learning           Immediate Recognition   (6 , 8 , 7) 65 93         Delayed Recognition   (8) 68 96  Percent Retention   (114 %) --- 66         Delayed Forced-Choice Recognition Hits   (8) --- 54         Delayed Forced-Choice Recognition False Alarms   (0) --- 75         Delayed Forced-Choice Recognition Discriminability --- 75     Story Learning           Immediate Recall   (23, 37) 49 46         Delayed Recall   (31) 48 42         Percent Retention   (84 %) --- 34      Daily Living Memory            Immediate Recall   (27, 23) 74 99          Delayed Recall   (9, 7) 62 88          Percent Retention (94 %) --- 73          Recognition Hits   (8) --- 27      Repeatable Battery for the Assessment of Neuropsychological Status (Form A): Scaled Score Percentile         Figure Recall   (5) 5 5      Visuospatial/Constructional Functioning        Repeatable Battery for the Assessment of Neuropsychological Status (Form A): Standard/Scaled Score Percentile      Visuospatial/Constructional Index 131 98         Figure Copy   (20) 14 91         Judgment of Line Orientation   (20) --- >75      Executive Functioning        Modified [place] Card Sorting Test (MWCST): Standard/T-Score Percentile      Number of Categories Correct 57 75      Number of Perseverative Errors 66 95      Number of Total Errors 53 62      Percent Perseverative Errors 65 93  Executive Function Composite 119 90      Trail Making Test:  T-Score Percentile      Part A 52 58      Part B 51 54      Boston Diagnostic Aphasia Exam: Raw Score Scaled Score      Complex Ideational Material 12 12      Clock Drawing Raw Score Descriptor      Command 10 WNL      Rating Scales        Clinical Dementia Rating Raw Score Descriptor      Sum of Boxes 1.5 Mild Cognitive Impairment      Global Score 0.5 MCI      Quick Dementia Rating System Raw Score Descriptor      Sum of Boxes 3 Very Mild Dementia      Total Score 6.5 Mild Dementia  Geriatric Depression Scale - Short Form 4 Negative   Meena Barrantes V. Nicole Kindred PsyD, [place] Clinical Neuropsychologist

## 2020-05-12 DIAGNOSIS — Z20822 Contact with and (suspected) exposure to covid-19: Secondary | ICD-10-CM | POA: Diagnosis not present

## 2020-05-12 NOTE — Progress Notes (Signed)
[facility]  Patient Name: Nicholas Mahoney MRN: [id number] Date of Birth: 1945-08-09 Age: 74 y.o. Education: 3 years  Clinical Impressions  Nicholas Mahoney is a 74 y.o., right-hand dominant, married man with a history of memory loss over the past 4 years. He was previously evaluated by Dr. Bonita Quin with [facility], who found his presentation consistent with essentially normal cognitive performance albeit with a low score on one measure of delayed free recall for visual information of dubious clinical significance. He returns for reevaluation, and his wife thinks his issues are getting worse. There is admittedly some marital stress that may be coloring the report of symptoms. He is repeating things, not catching things his wife says, misplacing things, and having difficulties with multitasking. His issues sound quite mild and do not appreciably impair his day-to-day functioning. He has no neuroimaging available for review.   On neuropsychological assessment, Nicholas Mahoney scored in the superior range with respect to overall cognitive abilities, setting a very stringent standard of comparison for his cognitive test data. His overall memory performance was average, which is within the expected range for him, although he did have an unusual number of low subtest scores. There is perhaps a trend towards slightly weaker memory performance at this evaluation as compared to the last evaluation when considering differences in instrumentation. There is a possibility of slightly weaker performance on measures of verbal fluency but I do not believe his performance on those tests is abnormal. All other findings were within gross limits of normal and similar to his scores at the previous evaluation. He had superior performance on a challenging card sorting measure involving concept formation and flexibility in response to ongoing feedback and errorless  visuospatial/constructional performance. His wife rated him as functioning at a dementia level, which is almost certainly an overestimate, his CDR is 1.5, which is questionable cognitive impairment to MCI at most.   [name] is thus demonstrating marginally weaker memory performance as compared to the previous evaluation, although his performance is still normal at an index level. He also has hearing issues. I am thus unconvinced that this represents a true neurocognitive disorder. The day-to-day problems they report may be related to adjustment difficulties in the setting of health issues, marital discord, and also hearing loss. The possibility of an underlying condition that is not yet neuropsychologically clear cannot be entirely ruled out. Would recommend that he follow up in 2 years or as clinically indicated. No clear indication for a cholinesterase inhibitor at this time. MRI of the brain could be considered for the sake of full and complete workup, to make sure nothing is being missed. I would recommend that he continue to exercise and eat a brain-healthy, heart-healthy diet, such as MIND DASH.   Diagnostic Impressions: Adjustment disorder, unspecified  Recommendations to be discussed with patient  Your performance and presentation on assessment today were consistent with superior overall cognitive ability and average range memory performance, which is normal for you. You did have some mild difficulty on a list learning task and a trend towards slightly weaker performance, but I remain unconvinced that this reaches the threshold necessary for a diagnosis of neurocognitive disorder. Adjustment difficulties related to your medical issues, marital stress, and hearing loss may be responsible for the day-to-day changes that you notice.   The fact that you have had difficulties noticed for approximately 4 to 5 years and are still doing relatively well on neuropsychological testing is reassuring. If  you wish, you may return for reevaluation in 2 years or sooner as clinically indicated, if you feel your difficulties are worsening over time.   The main treatments for delaying progression in the setting of cognitive impairment are lifestyle changes, such as diet and exercise, in addition to treating an underlying contributing conditions. If you are concerned about cognitive impairment, I would suggest you implement these changes, because they are generally healthy whether you have a cognitive disorder or not. This includes eating a brain-healthy, heart-health diet, such as the MIND DASH diet, continuing to exercise, and the like. Of course you should continue to follow with Dr. Addison Lank to safeguard your overall health status.   There is now good quality evidence from at least one large scale study that a modified mediterranean diet may help slow cognitive decline. This is known as the "MIND" diet. The Mind diet is not so much a specific diet as it is a set of recommendations for things that you should and should not eat.   Foods that are ENCOURAGED on the MIND Diet:  Green, leafy vegetables: Aim for six or more servings per week. This includes kale, spinach, cooked greens and salads.  All other vegetables: Try to eat another vegetable in addition to the green leafy vegetables at least once a day. It is best to choose non-starchy vegetables because they have a lot of nutrients with a low number of calories.  Berries: Eat berries at least twice a week. There is a plethora of research on strawberries, and other berries such as blueberries, raspberries and blackberries have also been found to have antioxidant and brain health benefits.  Nuts: Try to get five servings of nuts or more each week. The creators of the [place] don't specify what kind of nuts to consume, but it is probably best to vary the type of nuts you eat to obtain a variety of nutrients. Peanuts are a legume and do not fall into this  category.  Olive oil: Use olive oil as your main cooking oil. There may be other heart-healthy alternatives such as algae oil, though there is not yet sufficient research upon which to base a formal recommendation.  Whole grains: Aim for at least three servings daily. Choose minimally processed grains like oatmeal, quinoa, brown rice, whole-wheat pasta and 100% whole-wheat bread.  Fish: Eat fish at least once a week. It is best to choose fatty fish like salmon, sardines, trout, tuna and mackerel for their high amounts of omega-3 fatty acids.  Beans: Include beans in at least four meals every week. This includes all beans, lentils and soybeans.  Poultry: Try to eat chicken or [place] at least twice a week. Note that fried chicken is not encouraged on the MIND diet.  Wine: Aim for no more than one glass of alcohol daily. Both red and white wine may benefit the brain. However, much research has focused on the red wine compound resveratrol, which may help protect against Alzheimer's disease.  Foods that are DISCOURAGED on the MIND Diet: Butter and margarine: Try to eat less than 1 tablespoon (about 14 grams) daily. Instead, try using olive oil as your primary cooking fat, and dipping your bread in olive oil with herbs.  Cheese: The MIND diet recommends limiting your cheese consumption to less than once per week.  Red meat: Aim for no more than three servings each week. This includes all beef, pork, lamb and products made from these meats.  Maceo Pro food: The MIND diet  highly discourages fried food, especially the kind from fast-food restaurants. Limit your consumption to less than once per week.  Pastries and sweets: This includes most of the processed junk food and desserts you can think of. Ice cream, cookies, brownies, snack cakes, donuts, candy and more. Try to limit these to no more than four times a week.  Exercise is one of the best medicines for promoting health and maintaining cognitive fitness at  all stages in life. Exercise probably has the largest documented effect on brain health and performance of any lifestyle intervention. Studies have shown that even previously sedentary individuals who start exercising as late as age 80 show a significant survival benefit as compared to their non-exercising peers. In the [place], the current guidelines are for 30 minutes of moderate exercise per day, but increasing your activity level less than that may also be helpful. You do not have to get your 30 minutes of exercise in one shot and exercising for short periods of time spread throughout the day can be helpful. Go for several walks, learn to dance, or do something else you enjoy that gets your body moving. Of course, if you have an underlying medical condition or there is any question about whether it is safe for you to exercise, you should consult a medical treatment provider prior to beginning exercise.   Test Findings  Test scores are summarized in additional documentation associated with this encounter. Test scores are relative to age, gender, and educational history as available and appropriate. There were no concerns about performance validity as all findings fell within normal expectations.   General Intellectual Functioning/Achievement:  Performance on single-word reading was high average. By contrast, he demonstrated very superior performance on the RIST index, with a high average score on the more verbally mediated task and a very superior score on the more visually oriented portion of the test. His RIST index was used as a basis of comparison for his cognitive test scores, in conjunction with his previous neuropsychological test data.   Attention and Processing Efficiency: Performance on indicators of attention and processing efficiency was very good with a superior range score on digit repetition forward and a high average score on digit repetition backward. Timed number-symbol coding was  average. Simple numeric sequencing was average.These scores are similar to findings at the previous evaluation.   Language: Performance was normal on language measures, with errorless visual object confrontation naming. He did demonstrate a low average score on generation of words in response to letter prompts and average generation of animals in one minute. These findings are similar to if not just a bit weaker than his previous scores.   Visuospatial Function: Performance was errorless on measures of visuospatial functioning.   Learning and Memory: Performance on measures of learning and memory fell at a reasonable average level overall. Nevertheless, considering his very superior premorbid ability level, his performance is less than expected for him at a subtest level, mainly due to low scores on word list learning. The pattern is not concerning for a developed storage problem and rather, reflects difficulties with memory encoding and susceptibility to retroactive interference, which may be on the basis of hearing problems or subtle executive control issues. There was a trend towards marginally lower performance of unclear significance given differences in instrumentation.   In the verbal realm, immediate recall for a 12-item word list was low average followed by comparable low average short delayed recall and unusually low long-delayed recall. Nevertheless, his retention of  information across time was average. Discriminability for words from the list vs. False choices was low, which may be due to interference from a distractor alternate word list read in between immediate and short delayed recall trials. Short story learning was average on immediate and delayed recall, with reasonable retention of information across time. Memory for brief daily-living type information was very superior on immediate recall with high average delayed recall. Discriminability for this information contained amongst false  choices was average.   In the visual realm, his immediate recall for a series of shapes that are difficult to verbally encode was superior followed by errorless superior range delayed recognition. Yes/no discriminability for the shapes was high average.   Executive Functions: Performance was generally good on executive measures, with no clear changes as compared to his previous test performance. His Executive Function Composite was high average, nearly superior, on the Modified [employer]. Alternating sequencing of numbers and letters of the alphabet was average. Generation of words in response to the letters "F-A-S" was low average. Clock drawing was errorless and performance was also errorless when reasoning with complex verbal information.   Rating Scale(s): Mr. 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 He screened negative for the presence of depression. I was able to rate a CDR for him and his Sum of Boxes score is 1.5, his global score is 0.5, which places him in the MCI range at most.   Viviano Simas. Nicole Kindred PsyD, [place] Clinical Neuropsychologist

## 2020-05-15 ENCOUNTER — Telehealth: Payer: Self-pay

## 2020-05-15 ENCOUNTER — Other Ambulatory Visit: Payer: Self-pay

## 2020-05-15 ENCOUNTER — Inpatient Hospital Stay: Payer: Medicare Other | Attending: Oncology

## 2020-05-15 LAB — CBC WITH DIFFERENTIAL (CANCER CENTER ONLY)
Abs Immature Granulocytes: 0.02 10*3/uL (ref 0.00–0.07)
Basophils Absolute: 0.1 10*3/uL (ref 0.0–0.1)
Basophils Relative: 1 %
Eosinophils Absolute: 0 10*3/uL (ref 0.0–0.5)
Eosinophils Relative: 0 %
HCT: 46 % (ref 39.0–52.0)
Hemoglobin: 15.4 g/dL (ref 13.0–17.0)
Immature Granulocytes: 0 %
Lymphocytes Relative: 33 %
Lymphs Abs: 3.2 10*3/uL (ref 0.7–4.0)
MCH: 32.3 pg (ref 26.0–34.0)
MCHC: 33.5 g/dL (ref 30.0–36.0)
MCV: 96.4 fL (ref 80.0–100.0)
Monocytes Absolute: 1 10*3/uL (ref 0.1–1.0)
Monocytes Relative: 10 %
Neutro Abs: 5.3 10*3/uL (ref 1.7–7.7)
Neutrophils Relative %: 56 %
Platelet Count: 195 10*3/uL (ref 150–400)
RBC: 4.77 MIL/uL (ref 4.22–5.81)
RDW: 11.7 % (ref 11.5–15.5)
WBC Count: 9.5 10*3/uL (ref 4.0–10.5)
nRBC: 0 % (ref 0.0–0.2)

## 2020-05-15 LAB — FERRITIN: Ferritin: 49 ng/mL (ref 24–336)

## 2020-05-15 NOTE — Telephone Encounter (Signed)
-----   Message from Ladell Pier, MD sent at 05/15/2020  4:11 PM EST ----- Please call patient, ferritin remains in goal range, follow-up as scheduled

## 2020-05-15 NOTE — Telephone Encounter (Signed)
Called left message to call cancer center   Per Dr. Benay Spice ferritin within goal range F/U as scheduled

## 2020-05-16 DIAGNOSIS — M1711 Unilateral primary osteoarthritis, right knee: Secondary | ICD-10-CM | POA: Diagnosis not present

## 2020-05-16 DIAGNOSIS — G8918 Other acute postprocedural pain: Secondary | ICD-10-CM | POA: Diagnosis not present

## 2020-05-18 DIAGNOSIS — M25562 Pain in left knee: Secondary | ICD-10-CM | POA: Diagnosis not present

## 2020-05-18 NOTE — Telephone Encounter (Signed)
Patient called back and confirmed he saw his ferritin results and will f/u as scheduled.

## 2020-05-22 DIAGNOSIS — M25561 Pain in right knee: Secondary | ICD-10-CM | POA: Insufficient documentation

## 2020-05-22 DIAGNOSIS — M25562 Pain in left knee: Secondary | ICD-10-CM | POA: Insufficient documentation

## 2020-05-22 HISTORY — DX: Pain in left knee: M25.562

## 2020-05-23 ENCOUNTER — Ambulatory Visit (INDEPENDENT_AMBULATORY_CARE_PROVIDER_SITE_OTHER): Payer: Medicare Other | Admitting: Counselor

## 2020-05-23 ENCOUNTER — Other Ambulatory Visit: Payer: Self-pay

## 2020-05-23 ENCOUNTER — Encounter: Payer: Self-pay | Admitting: Counselor

## 2020-05-23 DIAGNOSIS — F09 Unspecified mental disorder due to known physiological condition: Secondary | ICD-10-CM

## 2020-05-23 DIAGNOSIS — H9190 Unspecified hearing loss, unspecified ear: Secondary | ICD-10-CM | POA: Diagnosis not present

## 2020-05-23 NOTE — Progress Notes (Signed)
NEUROPSYCHOLOGY TELEMEDICINE NOTE [facility]  Telemedicine statement:  I discussed the limitations of neuropsychological care via telemedicine and the availability of in person appointments. The patient expressed understanding and agreed to proceed. The patient was verified with two identifiers.  The visit modality was: telephonic The patient location was: home The provider location was: office  The following individuals participated: Crista Elliot  Feedback Note: I met with Nicholas Mahoney to review the findings resulting from his neuropsychological evaluation. Since the last appointment, he had a knee replacement. He was unable to participate very actively because he was in significant pain, but he verified his identify and provided his verbal assent for me to review the findings with his wife. Positively, he did not experience much in the way of postoperative delirium, which further corroborates his test findings and suggests he is doing fairly well, given that MCI is a significant risk factor for delirium. Time was spent reviewing the impressions and recommendations that are detailed in the evaluation report. We discussed impression of unconvincingly abnormal cognitive test performance. I was candid with his wife that neuropsychological testing is not infallible, and it is possible that there are some subtle changes. We reviewed strategies for mitigation of cognitive decline and I also counseled her on more helpful vs. Less helpful ways of dealing with memory loss. She presented as very reasonable and realizes that sometimes, she could be more patient. Recommend reevaluation in 2 years. I took time to explain the findings and answer all the patient's questions. I encouraged Mr. Knierim to contact me should he have any further questions or if further follow up is desired.   Current Medications and Medical History   Current Outpatient Medications  Medication  Sig Dispense Refill  . aspirin 81 MG tablet Take 1 tablet (81 mg total) by mouth daily. 30 tablet   . Cholecalciferol (VITAMIN D3) 2000 UNITS TABS Take 1 tablet by mouth daily.    . diclofenac (VOLTAREN) 75 MG EC tablet TAKE 1 TABLET BY MOUTH TWICE A DAY AFTER MEALS FOR INFLAMMATION, PAIN, OR SWELLING    . Diclofenac Sodium (VOLTAREN EX) Apply topically.    . hyoscyamine (LEVSIN SL) 0.125 MG SL tablet DISSOLVE 1 TABLET UNDER TONGUE 4 TIMES A DAY AS NEEDED    . Misc Natural Products (PROGESTERONE EX) Apply topically.    . simvastatin (ZOCOR) 20 MG tablet every evening.  1  . zolpidem (AMBIEN) 10 MG tablet 1 TABLET AT BEDTIME AS NEEDED FOR SLEEP ORALLY  0   No current facility-administered medications for this visit.    Patient Active Problem List   Diagnosis Date Noted  . Hereditary hemochromatosis ([place]) 04/10/2017    Mental Status and Behavioral Observations  Nicholas Mahoney was available at the prespecified time for this telephonic appointment and was alert and generally oriented (orientation not formally assessed). Speech was normal in rate, rhythm, volume, and prosody. Self-reported mood was "I've got a lot going on" and affect as assessed by vocal quality was neutral. Thought process was logical and goal-oriented and thought content was appropriate to the topics discussed. There were no safety concerns identified at today's encounter, such as thoughts of harming self or others.   Plan  Feedback provided regarding the patient's neuropsychological evaluation. He will schedule a personal follow up if he wishes for more detailed presentation of results after reading the AVS. Nicholas Mahoney was encouraged to contact me if any questions arise or if further follow up is  desired.   Viviano Simas Nicole Kindred, PsyD, [employer] Clinical Neuropsychologist  Service(s) Provided at This Encounter: 45 minutes [phone]; Conjoint therapy without patient present)

## 2020-05-23 NOTE — Patient Instructions (Signed)
Your performance and presentation on assessment today were consistent with superior overall cognitive ability and average range memory performance, which is normal for you. You did have some mild difficulty on a list learning task and a trend towards slightly weaker performance, but I remain unconvinced that this reaches the threshold necessary for a diagnosis of neurocognitive disorder. Adjustment difficulties related to your medical issues, marital stress, and hearing loss may be responsible for the day-to-day changes that you notice.   The fact that you have had difficulties noticed for approximately 4 to 5 years and are still doing relatively well on neuropsychological testing is reassuring. If you wish, you may return for reevaluation in 2 years or sooner as clinically indicated, if you feel your difficulties are worsening over time.   The main treatments for delaying progression in the setting of cognitive impairment are lifestyle changes, such as diet and exercise, in addition to treating an underlying contributing conditions. If you are concerned about cognitive impairment, I would suggest you implement these changes, because they are generally healthy whether you have a cognitive disorder or not. This includes eating a brain-healthy, heart-health diet, such as the MIND DASH diet, continuing to exercise, and the like. Of course you should continue to follow with Dr. Addison Lank to safeguard your overall health status.   There is now good quality evidence from at least one large scale study that a modified mediterranean diet may help slow cognitive decline. This is known as the "MIND" diet. The Mind diet is not so much a specific diet as it is a set of recommendations for things that you should and should not eat.   Foods that are ENCOURAGED on the MIND Diet:  Green, leafy vegetables: Aim for six or more servings per week. This includes kale, spinach, cooked greens and salads.  All other vegetables:  Try to eat another vegetable in addition to the green leafy vegetables at least once a day. It is best to choose non-starchy vegetables because they have a lot of nutrients with a low number of calories.  Berries: Eat berries at least twice a week. There is a plethora of research on strawberries, and other berries such as blueberries, raspberries and blackberries have also been found to have antioxidant and brain health benefits.  Nuts: Try to get five servings of nuts or more each week. The creators of the [place] don't specify what kind of nuts to consume, but it is probably best to vary the type of nuts you eat to obtain a variety of nutrients. Peanuts are a legume and do not fall into this category.  Olive oil: Use olive oil as your main cooking oil. There may be other heart-healthy alternatives such as algae oil, though there is not yet sufficient research upon which to base a formal recommendation.  Whole grains: Aim for at least three servings daily. Choose minimally processed grains like oatmeal, quinoa, brown rice, whole-wheat pasta and 100% whole-wheat bread.  Fish: Eat fish at least once a week. It is best to choose fatty fish like salmon, sardines, trout, tuna and mackerel for their high amounts of omega-3 fatty acids.  Beans: Include beans in at least four meals every week. This includes all beans, lentils and soybeans.  Poultry: Try to eat chicken or [place] at least twice a week. Note that fried chicken is not encouraged on the MIND diet.  Wine: Aim for no more than one glass of alcohol daily. Both red and white wine may benefit  the brain. However, much research has focused on the red wine compound resveratrol, which may help protect against Alzheimer's disease.  Foods that are DISCOURAGED on the MIND Diet: Butter and margarine: Try to eat less than 1 tablespoon (about 14 grams) daily. Instead, try using olive oil as your primary cooking fat, and dipping your bread in olive oil with herbs.   Cheese: The MIND diet recommends limiting your cheese consumption to less than once per week.  Red meat: Aim for no more than three servings each week. This includes all beef, pork, lamb and products made from these meats.  Maceo Pro food: The MIND diet highly discourages fried food, especially the kind from fast-food restaurants. Limit your consumption to less than once per week.  Pastries and sweets: This includes most of the processed junk food and desserts you can think of. Ice cream, cookies, brownies, snack cakes, donuts, candy and more. Try to limit these to no more than four times a week.  Exercise is one of the best medicines for promoting health and maintaining cognitive fitness at all stages in life. Exercise probably has the largest documented effect on brain health and performance of any lifestyle intervention. Studies have shown that even previously sedentary individuals who start exercising as late as age 35 show a significant survival benefit as compared to their non-exercising peers. In the [place], the current guidelines are for 30 minutes of moderate exercise per day, but increasing your activity level less than that may also be helpful. You do not have to get your 30 minutes of exercise in one shot and exercising for short periods of time spread throughout the day can be helpful. Go for several walks, learn to dance, or do something else you enjoy that gets your body moving. Of course, if you have an underlying medical condition or there is any question about whether it is safe for you to exercise, you should consult a medical treatment provider prior to beginning exercise.

## 2020-05-24 DIAGNOSIS — M25561 Pain in right knee: Secondary | ICD-10-CM | POA: Diagnosis not present

## 2020-05-24 DIAGNOSIS — Z96651 Presence of right artificial knee joint: Secondary | ICD-10-CM | POA: Insufficient documentation

## 2020-05-26 DIAGNOSIS — M25561 Pain in right knee: Secondary | ICD-10-CM | POA: Diagnosis not present

## 2020-05-29 DIAGNOSIS — M25561 Pain in right knee: Secondary | ICD-10-CM | POA: Diagnosis not present

## 2020-05-31 ENCOUNTER — Ambulatory Visit (HOSPITAL_COMMUNITY)
Admission: RE | Admit: 2020-05-31 | Discharge: 2020-05-31 | Disposition: A | Discharge status: Home / Self Care | Payer: Medicare Other | Source: Ambulatory Visit | Attending: Physician Assistant | Admitting: Physician Assistant

## 2020-05-31 ENCOUNTER — Other Ambulatory Visit: Payer: Self-pay

## 2020-05-31 ENCOUNTER — Other Ambulatory Visit (HOSPITAL_COMMUNITY): Payer: Self-pay | Admitting: Physician Assistant

## 2020-05-31 DIAGNOSIS — M25561 Pain in right knee: Secondary | ICD-10-CM | POA: Diagnosis not present

## 2020-05-31 DIAGNOSIS — M79604 Pain in right leg: Secondary | ICD-10-CM | POA: Insufficient documentation

## 2020-06-06 DIAGNOSIS — M25561 Pain in right knee: Secondary | ICD-10-CM | POA: Diagnosis not present

## 2020-06-08 DIAGNOSIS — M25561 Pain in right knee: Secondary | ICD-10-CM | POA: Diagnosis not present

## 2020-06-12 ENCOUNTER — Encounter: Payer: Medicare Other | Admitting: Counselor

## 2020-06-12 DIAGNOSIS — M25561 Pain in right knee: Secondary | ICD-10-CM | POA: Diagnosis not present

## 2020-06-14 DIAGNOSIS — M25561 Pain in right knee: Secondary | ICD-10-CM | POA: Diagnosis not present

## 2020-06-16 DIAGNOSIS — M25561 Pain in right knee: Secondary | ICD-10-CM | POA: Diagnosis not present

## 2020-06-19 DIAGNOSIS — M25561 Pain in right knee: Secondary | ICD-10-CM | POA: Diagnosis not present

## 2020-06-20 DIAGNOSIS — Z96651 Presence of right artificial knee joint: Secondary | ICD-10-CM | POA: Diagnosis not present

## 2020-06-21 DIAGNOSIS — M25561 Pain in right knee: Secondary | ICD-10-CM | POA: Diagnosis not present

## 2020-06-28 DIAGNOSIS — M25561 Pain in right knee: Secondary | ICD-10-CM | POA: Diagnosis not present

## 2020-06-29 DIAGNOSIS — M25561 Pain in right knee: Secondary | ICD-10-CM | POA: Diagnosis not present

## 2020-07-04 DIAGNOSIS — M25561 Pain in right knee: Secondary | ICD-10-CM | POA: Diagnosis not present

## 2020-07-05 DIAGNOSIS — M25561 Pain in right knee: Secondary | ICD-10-CM | POA: Diagnosis not present

## 2020-07-06 DIAGNOSIS — M25561 Pain in right knee: Secondary | ICD-10-CM | POA: Diagnosis not present

## 2020-07-07 DIAGNOSIS — N400 Enlarged prostate without lower urinary tract symptoms: Secondary | ICD-10-CM | POA: Diagnosis not present

## 2020-07-07 DIAGNOSIS — E785 Hyperlipidemia, unspecified: Secondary | ICD-10-CM | POA: Diagnosis not present

## 2020-07-07 DIAGNOSIS — K219 Gastro-esophageal reflux disease without esophagitis: Secondary | ICD-10-CM | POA: Diagnosis not present

## 2020-07-07 DIAGNOSIS — M170 Bilateral primary osteoarthritis of knee: Secondary | ICD-10-CM | POA: Diagnosis not present

## 2020-07-07 DIAGNOSIS — N401 Enlarged prostate with lower urinary tract symptoms: Secondary | ICD-10-CM | POA: Diagnosis not present

## 2020-07-07 DIAGNOSIS — M1711 Unilateral primary osteoarthritis, right knee: Secondary | ICD-10-CM | POA: Diagnosis not present

## 2020-07-10 DIAGNOSIS — M25561 Pain in right knee: Secondary | ICD-10-CM | POA: Diagnosis not present

## 2020-07-11 ENCOUNTER — Ambulatory Visit: Payer: Medicare Other | Admitting: Cardiology

## 2020-07-11 DIAGNOSIS — M25561 Pain in right knee: Secondary | ICD-10-CM | POA: Diagnosis not present

## 2020-07-14 DIAGNOSIS — M25561 Pain in right knee: Secondary | ICD-10-CM | POA: Diagnosis not present

## 2020-07-18 ENCOUNTER — Ambulatory Visit: Payer: Medicare Other | Admitting: Cardiology

## 2020-07-18 DIAGNOSIS — M25561 Pain in right knee: Secondary | ICD-10-CM | POA: Diagnosis not present

## 2020-07-19 DIAGNOSIS — M25561 Pain in right knee: Secondary | ICD-10-CM | POA: Diagnosis not present

## 2020-07-26 DIAGNOSIS — L570 Actinic keratosis: Secondary | ICD-10-CM | POA: Diagnosis not present

## 2020-07-26 DIAGNOSIS — D485 Neoplasm of uncertain behavior of skin: Secondary | ICD-10-CM | POA: Diagnosis not present

## 2020-07-26 DIAGNOSIS — D0439 Carcinoma in situ of skin of other parts of face: Secondary | ICD-10-CM | POA: Diagnosis not present

## 2020-07-27 DIAGNOSIS — M1712 Unilateral primary osteoarthritis, left knee: Secondary | ICD-10-CM | POA: Diagnosis not present

## 2020-07-27 DIAGNOSIS — Z0189 Encounter for other specified special examinations: Secondary | ICD-10-CM | POA: Diagnosis not present

## 2020-07-31 DIAGNOSIS — E785 Hyperlipidemia, unspecified: Secondary | ICD-10-CM | POA: Diagnosis not present

## 2020-07-31 DIAGNOSIS — K219 Gastro-esophageal reflux disease without esophagitis: Secondary | ICD-10-CM | POA: Diagnosis not present

## 2020-07-31 DIAGNOSIS — M25561 Pain in right knee: Secondary | ICD-10-CM | POA: Diagnosis not present

## 2020-07-31 DIAGNOSIS — M1711 Unilateral primary osteoarthritis, right knee: Secondary | ICD-10-CM | POA: Diagnosis not present

## 2020-07-31 DIAGNOSIS — N401 Enlarged prostate with lower urinary tract symptoms: Secondary | ICD-10-CM | POA: Diagnosis not present

## 2020-07-31 DIAGNOSIS — M170 Bilateral primary osteoarthritis of knee: Secondary | ICD-10-CM | POA: Diagnosis not present

## 2020-07-31 DIAGNOSIS — M1710 Unilateral primary osteoarthritis, unspecified knee: Secondary | ICD-10-CM | POA: Diagnosis not present

## 2020-08-01 ENCOUNTER — Encounter: Payer: Self-pay | Admitting: Counselor

## 2020-08-01 ENCOUNTER — Other Ambulatory Visit: Payer: Self-pay

## 2020-08-01 ENCOUNTER — Ambulatory Visit (INDEPENDENT_AMBULATORY_CARE_PROVIDER_SITE_OTHER): Payer: Medicare Other | Admitting: Counselor

## 2020-08-01 DIAGNOSIS — Z0189 Encounter for other specified special examinations: Secondary | ICD-10-CM | POA: Diagnosis not present

## 2020-08-01 DIAGNOSIS — F09 Unspecified mental disorder due to known physiological condition: Secondary | ICD-10-CM | POA: Diagnosis not present

## 2020-08-01 DIAGNOSIS — Z79899 Other long term (current) drug therapy: Secondary | ICD-10-CM | POA: Diagnosis not present

## 2020-08-01 DIAGNOSIS — M1712 Unilateral primary osteoarthritis, left knee: Secondary | ICD-10-CM | POA: Diagnosis not present

## 2020-08-01 DIAGNOSIS — Z5181 Encounter for therapeutic drug level monitoring: Secondary | ICD-10-CM | POA: Diagnosis not present

## 2020-08-01 NOTE — Progress Notes (Signed)
[facility]  Feedback Note: I met with Nicholas Mahoney to review the findings resulting from his neuropsychological evaluation. He presented with his wife Nicholas Mahoney. Since the last appointment, he has been doing well. He is Nicholas Mahoney his knee, which has been a long road, but he is working hard and making significant progress. Time was spent reviewing the impressions and recommendations that are detailed in the evaluation report. We discussed impression of some borderline abnormal test scores, albeit not in a meaningful pattern, and of dubious clinical significance. I counseled him regarding diet and exercise as preventative measures, as reflected in the patient instructions. I took time to explain the findings and answer all the patient's questions. I encouraged Nicholas Mahoney to contact me should he have any further questions or if further follow up is desired.   Current Medications and Medical History   Current Outpatient Medications  Medication Sig Dispense Refill  . aspirin 81 MG tablet Take 1 tablet (81 mg total) by mouth daily. 30 tablet   . Cholecalciferol (VITAMIN D3) 2000 UNITS TABS Take 1 tablet by mouth daily.    . diclofenac (VOLTAREN) 75 MG EC tablet TAKE 1 TABLET BY MOUTH TWICE A DAY AFTER MEALS FOR INFLAMMATION, PAIN, OR SWELLING    . Diclofenac Sodium (VOLTAREN EX) Apply topically.    . hyoscyamine (LEVSIN SL) 0.125 MG SL tablet DISSOLVE 1 TABLET UNDER TONGUE 4 TIMES A DAY AS NEEDED    . Misc Natural Products (PROGESTERONE EX) Apply topically.    . simvastatin (ZOCOR) 20 MG tablet every evening.  1  . zolpidem (AMBIEN) 10 MG tablet 1 TABLET AT BEDTIME AS NEEDED FOR SLEEP ORALLY  0   No current facility-administered medications for this visit.    Patient Active Problem List   Diagnosis Date Noted  . 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 Speech was normal in rate, rhythm, volume, and prosody. Self-reported mood was "good" and affect was mainly neutral. Thought process was logical and goal oriented and thought content was appropriate to the topics discussed. There were no safety concerns identified at today's encounter, such as thoughts of harming self or others.   Plan  Feedback provided regarding the patient's neuropsychological evaluation. He is doing well, continues to rehab from his knee replacement, is getting the other knee done in a couple days and feels ready but is not looking forward to it. Encouraged him to return for evaluation in 2 years as needed.  Nicholas Mahoney was encouraged to contact me if any questions arise or if further follow up is desired.   Viviano Simas Nicole Kindred, PsyD, ABN Clinical Neuropsychologist  Service(s) Provided at This Encounter: 28 minutes [phone]; Conjoint therapy with patient present)

## 2020-08-01 NOTE — Patient Instructions (Signed)
Your performance and presentation on assessment today were consistent with superior overall cognitive ability and average range memory performance, which is normal for you. You did have some mild difficulty on a list learning task and a trend towards slightly weaker performance, but I remain unconvinced that this reaches the threshold necessary for a diagnosis of neurocognitive disorder. Adjustment difficulties related to your medical issues, marital stress, and hearing loss may be responsible for the day-to-day changes that you notice.   The fact that you have had difficulties noticed for approximately 4 to 5 years and are still doing relatively well on neuropsychological testing is reassuring. If you wish, you may return for reevaluation in 2 years or sooner as clinically indicated, if you feel your difficulties are worsening over time.   The main treatments for delaying progression in the setting of cognitive impairment are lifestyle changes, such as diet and exercise, in addition to treating an underlying contributing conditions. If you are concerned about cognitive impairment, I would suggest you implement these changes, because they are generally healthy whether you have a cognitive disorder or not. This includes eating a brain-healthy, heart-health diet, such as the MIND DASH diet, continuing to exercise, and the like. Of course you should continue to follow with Dr. McNeill to safeguard your overall health status.   There is now good quality evidence from at least one large scale study that a modified mediterranean diet may help slow cognitive decline. This is known as the "MIND" diet. The Mind diet is not so much a specific diet as it is a set of recommendations for things that you should and should not eat.   Foods that are ENCOURAGED on the MIND Diet:  Green, leafy vegetables: Aim for six or more servings per week. This includes kale, spinach, cooked greens and salads.  All other vegetables:  Try to eat another vegetable in addition to the green leafy vegetables at least once a day. It is best to choose non-starchy vegetables because they have a lot of nutrients with a low number of calories.  Berries: Eat berries at least twice a week. There is a plethora of research on strawberries, and other berries such as blueberries, raspberries and blackberries have also been found to have antioxidant and brain health benefits.  Nuts: Try to get five servings of nuts or more each week. The creators of the MIND diet don't specify what kind of nuts to consume, but it is probably best to vary the type of nuts you eat to obtain a variety of nutrients. Peanuts are a legume and do not fall into this category.  Olive oil: Use olive oil as your main cooking oil. There may be other heart-healthy alternatives such as algae oil, though there is not yet sufficient research upon which to base a formal recommendation.  Whole grains: Aim for at least three servings daily. Choose minimally processed grains like oatmeal, quinoa, brown rice, whole-wheat pasta and 100% whole-wheat bread.  Fish: Eat fish at least once a week. It is best to choose fatty fish like salmon, sardines, trout, tuna and mackerel for their high amounts of omega-3 fatty acids.  Beans: Include beans in at least four meals every week. This includes all beans, lentils and soybeans.  Poultry: Try to eat chicken or turkey at least twice a week. Note that fried chicken is not encouraged on the MIND diet.  Wine: Aim for no more than one glass of alcohol daily. Both red and white wine may benefit   the brain. However, much research has focused on the red wine compound resveratrol, which may help protect against Alzheimer's disease.  Foods that are DISCOURAGED on the MIND Diet: Butter and margarine: Try to eat less than 1 tablespoon (about 14 grams) daily. Instead, try using olive oil as your primary cooking fat, and dipping your bread in olive oil with herbs.   Cheese: The MIND diet recommends limiting your cheese consumption to less than once per week.  Red meat: Aim for no more than three servings each week. This includes all beef, pork, lamb and products made from these meats.  Fried food: The MIND diet highly discourages fried food, especially the kind from fast-food restaurants. Limit your consumption to less than once per week.  Pastries and sweets: This includes most of the processed junk food and desserts you can think of. Ice cream, cookies, brownies, snack cakes, donuts, candy and more. Try to limit these to no more than four times a week.  Exercise is one of the best medicines for promoting health and maintaining cognitive fitness at all stages in life. Exercise probably has the largest documented effect on brain health and performance of any lifestyle intervention. Studies have shown that even previously sedentary individuals who start exercising as late as age 85 show a significant survival benefit as compared to their non-exercising peers. In the [place], the current guidelines are for 30 minutes of moderate exercise per day, but increasing your activity level less than that may also be helpful. You do not have to get your 30 minutes of exercise in one shot and exercising for short periods of time spread throughout the day can be helpful. Go for several walks, learn to dance, or do something else you enjoy that gets your body moving. Of course, if you have an underlying medical condition or there is any question about whether it is safe for you to exercise, you should consult a medical treatment provider prior to beginning exercise.   

## 2020-08-02 DIAGNOSIS — M25561 Pain in right knee: Secondary | ICD-10-CM | POA: Diagnosis not present

## 2020-08-03 ENCOUNTER — Ambulatory Visit: Payer: Medicare Other | Admitting: Cardiology

## 2020-08-04 DIAGNOSIS — M25561 Pain in right knee: Secondary | ICD-10-CM | POA: Diagnosis not present

## 2020-08-08 DIAGNOSIS — G8918 Other acute postprocedural pain: Secondary | ICD-10-CM | POA: Diagnosis not present

## 2020-08-08 DIAGNOSIS — M1712 Unilateral primary osteoarthritis, left knee: Secondary | ICD-10-CM | POA: Diagnosis not present

## 2020-08-10 DIAGNOSIS — M25562 Pain in left knee: Secondary | ICD-10-CM | POA: Diagnosis not present

## 2020-08-14 DIAGNOSIS — M25562 Pain in left knee: Secondary | ICD-10-CM | POA: Diagnosis not present

## 2020-08-16 DIAGNOSIS — M25562 Pain in left knee: Secondary | ICD-10-CM | POA: Diagnosis not present

## 2020-08-18 DIAGNOSIS — M25562 Pain in left knee: Secondary | ICD-10-CM | POA: Diagnosis not present

## 2020-08-21 DIAGNOSIS — M25562 Pain in left knee: Secondary | ICD-10-CM | POA: Diagnosis not present

## 2020-08-23 DIAGNOSIS — M25562 Pain in left knee: Secondary | ICD-10-CM | POA: Diagnosis not present

## 2020-08-25 DIAGNOSIS — M25562 Pain in left knee: Secondary | ICD-10-CM | POA: Diagnosis not present

## 2020-08-28 DIAGNOSIS — M25562 Pain in left knee: Secondary | ICD-10-CM | POA: Diagnosis not present

## 2020-08-30 DIAGNOSIS — M25562 Pain in left knee: Secondary | ICD-10-CM | POA: Diagnosis not present

## 2020-09-01 DIAGNOSIS — M25562 Pain in left knee: Secondary | ICD-10-CM | POA: Diagnosis not present

## 2020-09-04 DIAGNOSIS — M25562 Pain in left knee: Secondary | ICD-10-CM | POA: Diagnosis not present

## 2020-09-05 DIAGNOSIS — L820 Inflamed seborrheic keratosis: Secondary | ICD-10-CM | POA: Diagnosis not present

## 2020-09-05 DIAGNOSIS — D0430 Carcinoma in situ of skin of unspecified part of face: Secondary | ICD-10-CM | POA: Diagnosis not present

## 2020-09-06 DIAGNOSIS — M25562 Pain in left knee: Secondary | ICD-10-CM | POA: Diagnosis not present

## 2020-09-08 DIAGNOSIS — M25562 Pain in left knee: Secondary | ICD-10-CM | POA: Diagnosis not present

## 2020-09-11 DIAGNOSIS — M25562 Pain in left knee: Secondary | ICD-10-CM | POA: Diagnosis not present

## 2020-09-12 DIAGNOSIS — Z96651 Presence of right artificial knee joint: Secondary | ICD-10-CM | POA: Diagnosis not present

## 2020-09-12 DIAGNOSIS — Z471 Aftercare following joint replacement surgery: Secondary | ICD-10-CM | POA: Diagnosis not present

## 2020-09-13 DIAGNOSIS — M1710 Unilateral primary osteoarthritis, unspecified knee: Secondary | ICD-10-CM | POA: Diagnosis not present

## 2020-09-13 DIAGNOSIS — E785 Hyperlipidemia, unspecified: Secondary | ICD-10-CM | POA: Diagnosis not present

## 2020-09-13 DIAGNOSIS — N401 Enlarged prostate with lower urinary tract symptoms: Secondary | ICD-10-CM | POA: Diagnosis not present

## 2020-09-13 DIAGNOSIS — K219 Gastro-esophageal reflux disease without esophagitis: Secondary | ICD-10-CM | POA: Diagnosis not present

## 2020-09-13 DIAGNOSIS — M25562 Pain in left knee: Secondary | ICD-10-CM | POA: Diagnosis not present

## 2020-09-13 DIAGNOSIS — N400 Enlarged prostate without lower urinary tract symptoms: Secondary | ICD-10-CM | POA: Diagnosis not present

## 2020-09-13 DIAGNOSIS — M170 Bilateral primary osteoarthritis of knee: Secondary | ICD-10-CM | POA: Diagnosis not present

## 2020-09-13 DIAGNOSIS — M1711 Unilateral primary osteoarthritis, right knee: Secondary | ICD-10-CM | POA: Diagnosis not present

## 2020-09-15 ENCOUNTER — Other Ambulatory Visit: Payer: Self-pay

## 2020-09-15 ENCOUNTER — Inpatient Hospital Stay (HOSPITAL_BASED_OUTPATIENT_CLINIC_OR_DEPARTMENT_OTHER): Payer: Medicare Other | Admitting: Oncology

## 2020-09-15 ENCOUNTER — Inpatient Hospital Stay: Payer: Medicare Other | Attending: Oncology

## 2020-09-15 ENCOUNTER — Telehealth: Payer: Self-pay | Admitting: *Deleted

## 2020-09-15 DIAGNOSIS — H9193 Unspecified hearing loss, bilateral: Secondary | ICD-10-CM | POA: Diagnosis not present

## 2020-09-15 DIAGNOSIS — Z96653 Presence of artificial knee joint, bilateral: Secondary | ICD-10-CM | POA: Diagnosis not present

## 2020-09-15 LAB — FERRITIN: Ferritin: 92 ng/mL (ref 24–336)

## 2020-09-15 LAB — CBC WITH DIFFERENTIAL (CANCER CENTER ONLY)
Abs Immature Granulocytes: 0.02 10*3/uL (ref 0.00–0.07)
Basophils Absolute: 0 10*3/uL (ref 0.0–0.1)
Basophils Relative: 1 %
Eosinophils Absolute: 0.2 10*3/uL (ref 0.0–0.5)
Eosinophils Relative: 3 %
HCT: 44.8 % (ref 39.0–52.0)
Hemoglobin: 14.8 g/dL (ref 13.0–17.0)
Immature Granulocytes: 0 %
Lymphocytes Relative: 29 %
Lymphs Abs: 1.7 10*3/uL (ref 0.7–4.0)
MCH: 31.2 pg (ref 26.0–34.0)
MCHC: 33 g/dL (ref 30.0–36.0)
MCV: 94.5 fL (ref 80.0–100.0)
Monocytes Absolute: 0.5 10*3/uL (ref 0.1–1.0)
Monocytes Relative: 8 %
Neutro Abs: 3.5 10*3/uL (ref 1.7–7.7)
Neutrophils Relative %: 59 %
Platelet Count: 196 10*3/uL (ref 150–400)
RBC: 4.74 MIL/uL (ref 4.22–5.81)
RDW: 12.1 % (ref 11.5–15.5)
WBC Count: 6 10*3/uL (ref 4.0–10.5)
nRBC: 0 % (ref 0.0–0.2)

## 2020-09-15 NOTE — Progress Notes (Signed)
  [facility] OFFICE PROGRESS NOTE   Diagnosis: Hemochromatosis  INTERVAL HISTORY:   Nicholas Mahoney returns for a scheduled visit.  He is undergone bilateral knee replacement procedures over the past 4 months.  The last knee replacement was 5 weeks ago.  He is participating in physical therapy.  No other complaint.  He last underwent phlebotomy in October 2020.  Objective:  Vital signs in last 24 hours:  Blood pressure (!) 146/81, pulse 81, temperature 97.7 F (36.5 C), temperature source Tympanic, resp. rate 18, height 6\' 1"  (1.854 m), weight 161 lb (73 kg), SpO2 99 %.   Resp: Lungs clear bilaterally Cardio: Regular rate and rhythm GI: No hepatosplenomegaly, palpable aortic pulse in the mid upper abdomen with a faint murmur Vascular: No leg edema, brown discoloration of the lower leg bilaterally  Lab Results:  Lab Results  Component Value Date   WBC 6.0 09/15/2020   HGB 14.8 09/15/2020   HCT 44.8 09/15/2020   MCV 94.5 09/15/2020   PLT 196 09/15/2020   NEUTROABS 3.5 09/15/2020    CMP  Lab Results  Component Value Date   NA 140 10/19/2018   K 4.5 10/19/2018   CL 106 10/19/2018   CO2 24 10/19/2018   GLUCOSE 84 10/19/2018   BUN 21 10/19/2018   CREATININE 0.79 10/19/2018   CALCIUM 9.2 10/19/2018   PROT 6.9 09/07/2019   ALBUMIN 4.6 09/07/2019   AST 57 (H) 09/07/2019   ALT 49 (H) 09/07/2019   ALKPHOS 80 09/07/2019   BILITOT 0.7 09/07/2019   GFRNONAA >60 10/19/2018   GFRAA >60 10/19/2018  Ferritin on 05/15/2020: 49  Medications: I have reviewed the patient's current medications.   Assessment/Plan:  1. 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 MRI abdomen 11/05/2016 consistent with liver iron deposition ? Elevated ferritin 03/18/2017(446) ? Phlebotomy 04/10/2017, 04/17/2017, 05/13/2017, 05/20/2017, 06/05/2017,07/15/2017, 07/29/2017,08/13/2007, 08/26/2017, 09/30/2017, 10/14/2017, 10/27/2017, 03/30/2019, 04/12/2019  2.BPH, status post  TUR  3.History of positional vertigo  4.Bilateral hearing loss 5.   Knee arthritis-status post bilateral knee replacement    Disposition: Nicholas Mahoney appears stable.  We will follow up on the ferritin level from today.  The plan is to initiate phlebotomy therapy for a ferritin of greater than 100.  He will return for lab visit in 4 months and an office visit in 8 months.  There may be an abdominal aortic bruit on exam today.  He plans to see cardiology next month.  I will asked Dr. Marlou Porch to evaluate the abdominal aorta and consider imaging as indicated.  Betsy Coder, MD  09/15/2020  11:02 AM

## 2020-09-15 NOTE — Telephone Encounter (Signed)
Notifed wife (patient not home) of ferritin result of 92, which is still in goal range. F/U in 4 months for repeat lab.

## 2020-09-15 NOTE — Telephone Encounter (Signed)
-----   Message from Ladell Pier, MD sent at 09/15/2020 12:14 PM EST ----- Please call patient, ferritin remains in goal range, follow-up as scheduled, will initiate phlebotomy therapy for a ferritin of greater than 100

## 2020-09-17 ENCOUNTER — Telehealth: Payer: Self-pay | Admitting: Cardiology

## 2020-09-17 DIAGNOSIS — R0989 Other specified symptoms and signs involving the circulatory and respiratory systems: Secondary | ICD-10-CM

## 2020-09-17 NOTE — Telephone Encounter (Signed)
-----   Message from Ladell Pier, MD sent at 09/15/2020 12:12 PM EST ----- You are seeing him as a new patient on 10/12/2020  I follow him for hemochromatosis, last phlebotomy in October 2020  I noted a prominent aortic pulse in the upper abdomen with a mild bruit on exam today  Can you check this when you see him and order imaging if indicated?  Thanks,  Julieanne Manson

## 2020-09-17 NOTE — Telephone Encounter (Signed)
Please order abdominal ultrasound to evaluate aorta. Abnormal pulse on exam  Thanks  Candee Furbish, MD

## 2020-09-18 ENCOUNTER — Telehealth: Payer: Self-pay | Admitting: Oncology

## 2020-09-18 DIAGNOSIS — M25562 Pain in left knee: Secondary | ICD-10-CM | POA: Diagnosis not present

## 2020-09-18 NOTE — Telephone Encounter (Signed)
Scheduled appointments per 3/11 los. Spoke to patient's wife who is aware of appointments dates and times.

## 2020-09-18 NOTE — Telephone Encounter (Signed)
Spoke with patient and wife to discuss Dr. Marlou Porch recommendation for an abdominal US.  Patient agreeable to plan order placed.

## 2020-09-20 DIAGNOSIS — M25562 Pain in left knee: Secondary | ICD-10-CM | POA: Diagnosis not present

## 2020-09-21 ENCOUNTER — Ambulatory Visit: Payer: Medicare Other | Admitting: Oncology

## 2020-09-21 ENCOUNTER — Other Ambulatory Visit: Payer: Medicare Other

## 2020-09-22 ENCOUNTER — Ambulatory Visit: Payer: Medicare Other | Admitting: Cardiology

## 2020-09-25 DIAGNOSIS — M25562 Pain in left knee: Secondary | ICD-10-CM | POA: Diagnosis not present

## 2020-09-27 DIAGNOSIS — M25562 Pain in left knee: Secondary | ICD-10-CM | POA: Diagnosis not present

## 2020-09-28 ENCOUNTER — Other Ambulatory Visit (HOSPITAL_COMMUNITY): Payer: Medicare Other

## 2020-09-29 DIAGNOSIS — M25562 Pain in left knee: Secondary | ICD-10-CM | POA: Diagnosis not present

## 2020-10-02 DIAGNOSIS — M25562 Pain in left knee: Secondary | ICD-10-CM | POA: Diagnosis not present

## 2020-10-04 DIAGNOSIS — Z23 Encounter for immunization: Secondary | ICD-10-CM | POA: Diagnosis not present

## 2020-10-04 DIAGNOSIS — M25562 Pain in left knee: Secondary | ICD-10-CM | POA: Diagnosis not present

## 2020-10-09 DIAGNOSIS — M25562 Pain in left knee: Secondary | ICD-10-CM | POA: Diagnosis not present

## 2020-10-10 ENCOUNTER — Other Ambulatory Visit: Payer: Self-pay

## 2020-10-10 ENCOUNTER — Ambulatory Visit (HOSPITAL_COMMUNITY)
Admission: RE | Admit: 2020-10-10 | Discharge: 2020-10-10 | Disposition: A | Discharge status: Home / Self Care | Payer: Medicare Other | Source: Ambulatory Visit | Attending: Cardiovascular Disease | Admitting: Cardiovascular Disease

## 2020-10-10 DIAGNOSIS — R0989 Other specified symptoms and signs involving the circulatory and respiratory systems: Secondary | ICD-10-CM | POA: Diagnosis not present

## 2020-10-12 ENCOUNTER — Encounter: Payer: Self-pay | Admitting: Cardiology

## 2020-10-12 ENCOUNTER — Ambulatory Visit (INDEPENDENT_AMBULATORY_CARE_PROVIDER_SITE_OTHER): Payer: Medicare Other | Admitting: Cardiology

## 2020-10-12 ENCOUNTER — Other Ambulatory Visit: Payer: Self-pay

## 2020-10-12 VITALS — BP 110/70 | HR 79 | Ht 73.0 in | Wt 152.0 lb

## 2020-10-12 DIAGNOSIS — I700 Atherosclerosis of aorta: Secondary | ICD-10-CM | POA: Diagnosis not present

## 2020-10-12 DIAGNOSIS — Z8249 Family history of ischemic heart disease and other diseases of the circulatory system: Secondary | ICD-10-CM | POA: Diagnosis not present

## 2020-10-12 DIAGNOSIS — Z79899 Other long term (current) drug therapy: Secondary | ICD-10-CM

## 2020-10-12 DIAGNOSIS — Z20822 Contact with and (suspected) exposure to covid-19: Secondary | ICD-10-CM | POA: Diagnosis not present

## 2020-10-12 DIAGNOSIS — E782 Mixed hyperlipidemia: Secondary | ICD-10-CM | POA: Diagnosis not present

## 2020-10-12 MED ORDER — ROSUVASTATIN CALCIUM 20 MG PO TABS: 20.0000 mg | ORAL_TABLET | Freq: Every day | ORAL | 3 refills | Status: DC

## 2020-10-12 NOTE — Patient Instructions (Signed)
Medication Instructions:  Please discontinue your Simvastatin and start Crestor 20 mg a day.  Continue all other medications as listed.  *If you need a refill on your cardiac medications before your next appointment, please call your pharmacy*  Lab Work: Please have lipid and ALT in 3 months.  If you have labs (blood work) drawn today and your tests are completely normal, you will receive your results only by: Marland Kitchen MyChart Message (if you have MyChart) OR . A paper copy in the mail If you have any lab test that is abnormal or we need to change your treatment, we will call you to review the results.  Testing/Procedures: Your physician has requested that you have Coronary Calcium Score which is completed by CT. Cardiac computed tomography (CT) is a painless test that uses an x-ray machine to take clear, detailed pictures of your heart.   This test is completed here at our office and the cost is $99.  There are no instructions for this testing.  Follow-Up: At [facility], you and your health needs are our priority.  As part of our continuing mission to provide you with exceptional heart care, we have created designated Provider Care Teams.  These Care Teams include your primary Cardiologist (physician) and Advanced Practice Providers (APPs -  Physician Assistants and Nurse Practitioners) who all work together to provide you with the care you need, when you need it.  We recommend signing up for the patient portal called "MyChart".  Sign up information is provided on this After Visit Summary.  MyChart is used to connect with patients for Virtual Visits (Telemedicine).  Patients are able to view lab/test results, encounter notes, upcoming appointments, etc.  Non-urgent messages can be sent to your provider as well.   To learn more about what you can do with MyChart, go to [email or web address].    Your next appointment:   12 month(s)  The format for your next appointment:   In  Person  Provider:   Candee Furbish, MD   Thank you for choosing [facility]!!

## 2020-10-12 NOTE — Progress Notes (Signed)
Cardiology Office Note:    Date:  10/12/2020   ID:  Nicholas Mahoney, DOB February 22, 1946, MRN [id number]  PCP:  Nicholas Mahoney, [place]  Cardiologist:  No primary care provider on file. Advanced Practice Provider:  No care team member to display Electrophysiologist:  None       Referring MD: Nicholas Caraway, MD     History of Present Illness:    Nicholas Mahoney is a 75 y.o. male here for the evaluation of overall heart health at the request of Nicholas Mahoney.  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  Dr. Benay Mahoney has seen him for his hereditary hemochromatosis.  Last knee replacement in early February  2022.  On exam, he felt as though he was hearing an abdominal bruit.  We ordered a abdominal aortic ultrasound/duplex.  This was normal.  Excessive diaphoresis.  He does not have any chest pain no fevers chills nausea vomiting syncope bleeding orthopnea shortness of breath.  He has lost about 10 to 15 pounds recently.  Has had both knees replaced over the last year.  Father - MI, CAD, died 49 Brother - 78 years older, PCI,coronary artery disease Brother - younger DM Mother - Cancer, MI died suddenly  Back in 2009, 01/27/2008, had no angiographically significant CAD and normal ejection fraction with false positive nuclear stress test suggestive of inferior wall ischemia.  There was no evidence of CAD once again.  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   Creatinine 0.75 ALT 26 hemoglobin 14.8.  LDL 86 HDL 71 total cholesterol 136 triglycerides 75  Occasionally will complain of ankle edema over the last several years.  Today looks pretty good.  Had an ankle injury in college on the right side.   Past Medical History:  Diagnosis Date  . Bladder outlet obstruction   . BPH (benign prostatic hyperplasia)   . ED (erectile dysfunction)   .  Hyperlipidemia   . Lower urinary tract symptoms (LUTS)   . Migraines    none recent  . 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 OA (osteoarthritis) of knee   . Vitamin D deficiency   . Wears glasses   . Wears hearing aid    bilateral    Past Surgical History:  Procedure Laterality Date  . APPENDECTOMY  1980's  . CARDIAC CATHETERIZATION  2008/01/27  dr Nicholas Mahoney   abnormal myoview/  no sig. cad,  normal LVF,  ef 60%,  false positive myoview  . GREEN LIGHT LASER TURP (TRANSURETHRAL RESECTION OF PROSTATE N/A 06/13/2015   Procedure: GREEN LIGHT LASER TURP (TRANSURETHRAL RESECTION OF PROSTATE;  Surgeon: Nicholas Aloe, MD;  [facility]: [facility];  Service: Urology;  Laterality: N/A;  . HERNIA REPAIR  infant  . KNEE ARTHROSCOPY W/ MENISCECTOMY Bilateral left  10-04-2008/  right 11-09-2007   and chondraplasty/  synovectomy  . TRANSURETHRAL RESECTION OF PROSTATE N/A 05/18/2019   Procedure: TRANSURETHRAL RESECTION OF THE PROSTATE (TURP);  Surgeon: Nicholas Aloe, MD;  Location: [facility];  Service: Urology;  Laterality: N/A;  . VASECTOMY  1980's    Current Medications: Current Meds  Medication Sig  . aspirin 81 MG tablet Take 1 tablet (81 mg total) by mouth daily.  . Cholecalciferol (VITAMIN D3 PO) Take 1,000 Units by mouth daily.  . diclofenac (VOLTAREN) 75 MG EC tablet Take 75 mg by mouth daily.  Marland Kitchen docusate sodium (COLACE)  100 MG capsule Take 200 mg by mouth every other day.  . hyoscyamine (LEVSIN SL) 0.125 MG SL tablet DISSOLVE 1 TABLET UNDER TONGUE 4 TIMES A DAY AS NEEDED  . rosuvastatin (CRESTOR) 20 MG tablet Take 1 tablet (20 mg total) by mouth daily.  Marland Kitchen zolpidem (AMBIEN) 10 MG tablet 1 TABLET AT BEDTIME AS NEEDED FOR SLEEP ORALLY  . [DISCONTINUED] simvastatin (ZOCOR) 20 MG tablet every evening.     Allergies:   Patient has no known allergies.   Social History   Socioeconomic History  . Marital status: Married    Spouse name: Not on file   . Number of children: Not on file  . Years of education: Not on file  . Highest education level: Not on file  Occupational History  . Not on file  Tobacco Use  . Smoking status: Never Smoker  . Smokeless tobacco: Never Used  Vaping Use  . Vaping Use: Never used  Substance and Sexual Activity  . Alcohol use: Yes    Alcohol/week: 1.0 - 2.0 standard drink    Types: 1 - 2 Glasses of wine per week    Comment: occ  . Drug use: No  . Sexual activity: Not on file    Comment: vasectomy  Other Topics Concern  . Not on file  Social History Narrative  . Not on file   Social Determinants of Health   Financial Resource Strain: Not on file  Food Insecurity: Not on file  Transportation Needs: Not on file  Physical Activity: Not on file  Stress: Not on file  Social Connections: Not on file     Family History: The patient's family history includes Cancer in his father and mother; Coronary artery disease in his brother and mother; Diabetes in his brother and mother; Heart attack in his father.  ROS:   Please see the history of present illness.     All other systems reviewed and are negative.  EKGs/Labs/Other Studies Reviewed:    The following studies were reviewed today:  AAA duplex 10/10/2020:   Summary:  Abdominal Aorta: No evidence of an abdominal aortic aneurysm was  visualized. The largest aortic measurement is 2.7 cm. Abnormal dilatation  of the abdominal aorta is noted. The largest diameter is at the mid  segment. No previous exam available for  comparison.  Stenosis:  Widely patent bilateral common and external iliac arteries without  evidence of atherosclerosis; no stenosis.   IVC/Iliac: Patent IVC.   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  - There is no evidence of deep vein thrombosis in the lower extremity.    - No cystic structure found in the popliteal fossa.     LEFT:  - No evidence of deep vein thrombosis in the lower extremity. No indirect  evidence of obstruction proximal to the inguinal ligament.    *See table(s) above for measurements and observations.   EKG:  EKG is  ordered today.  The ekg ordered today demonstrates sinus rhythm 79 no other abnormalities  Recent Labs: 09/15/2020: Hemoglobin 14.8; Platelet Count 196  Recent Lipid Panel    Component Value Date/Time   CHOL 136 09/09/2018 0000   TRIG 84 09/09/2018 0000   HDL 89 (A) 09/09/2018 0000   CHOLHDL 3 05/10/2013 0744   VLDL 13.6 05/10/2013 0744   LDLCALC 72 09/09/2018 0000     Risk Assessment/Calculations:      Physical Exam:  VS:  BP 110/70 (BP Location: Left Arm, Patient Position: Sitting, Cuff Size: Normal)   Pulse 79   Ht 6\' 1"  (1.854 m)   Wt 152 lb (68.9 kg)   SpO2 97%   BMI 20.05 kg/m     Wt Readings from Last 3 Encounters:  10/12/20 152 lb (68.9 kg)  09/15/20 161 lb (73 kg)  01/20/20 174 lb 1.6 oz (79 kg)     GEN:  Well nourished, well developed in no acute distress HEENT: Normal NECK: No JVD; No carotid bruits LYMPHATICS: No lymphadenopathy CARDIAC: RRR, no murmurs, rubs, gallops RESPIRATORY:  Clear to auscultation without rales, wheezing or rhonchi  ABDOMEN: Soft, non-tender, non-distended MUSCULOSKELETAL:  No edema; No deformity  SKIN: Warm and dry NEUROLOGIC:  Alert and oriented x 3 PSYCHIATRIC:  Normal affect   ASSESSMENT:    1. Aortic atherosclerosis ([place])   2. Family history of coronary artery disease   3. Hereditary hemochromatosis ([place])   4. Mixed hyperlipidemia   5. Medication management    PLAN:    In order of problems listed above:  Aortic atherosclerosis -This was originally seen on MRI of the abdomen in 2018. -Currently on simvastatin 20 mg.  LDL 71.  Goal would be less than 70.  It would not be unreasonable for him to transition to a high intensity statin such as Crestor 20 mg once a day. -Thankfully there is no  evidence of aortic aneurysm/stenosis on ultrasound.  Reassuring.  Continue with adequate blood pressure control.  Continue with aspirin 81 mg.  Hereditary hemochromatosis -Occasional phlebotomy.  Doing well.  Dr. Benay Mahoney has been monitoring closely.  Plan is for phlebotomy for any ferritin greater than 100.    Hyperlipidemia -As above, will attempt transition to Crestor 20 mg a day.  We will go ahead and send in a prescription for Crestor.  Check a lipid panel and ALT in 3 months.  Family history of CAD -I will go ahead and check a coronary calcium score for him as well.  $99.  This will help guide further therapies.     Medication Adjustments/Labs and Tests Ordered: Current medicines are reviewed at length with the patient today.  Concerns regarding medicines are outlined above.  Orders Placed This Encounter  Procedures  . CT CARDIAC SCORING (SELF PAY ONLY)  . ALT  . Lipid panel  . EKG 12-Lead   Meds ordered this encounter  Medications  . rosuvastatin (CRESTOR) 20 MG tablet    Sig: Take 1 tablet (20 mg total) by mouth daily.    Dispense:  90 tablet    Refill:  3    Patient Instructions  Medication Instructions:  Please discontinue your Simvastatin and start Crestor 20 mg a day.  Continue all other medications as listed.  *If you need a refill on your cardiac medications before your next appointment, please call your pharmacy*  Lab Work: Please have lipid and ALT in 3 months.  If you have labs (blood work) drawn today and your tests are completely normal, you will receive your results only by: Marland Kitchen MyChart Message (if you have MyChart) OR . A paper copy in the mail If you have any lab test that is abnormal or we need to change your treatment, we will call you to review the results.  Testing/Procedures: Your physician has requested that you have Coronary Calcium Score which is completed by CT. Cardiac computed tomography (CT) is a painless test that uses an x-ray machine to  take clear,  detailed pictures of your heart.   This test is completed here at our office and the cost is $99.  There are no instructions for this testing.  Follow-Up: At [facility], you and your health needs are our priority.  As part of our continuing mission to provide you with exceptional heart care, we have created designated Provider Care Teams.  These Care Teams include your primary Cardiologist (physician) and Advanced Practice Providers (APPs -  Physician Assistants and Nurse Practitioners) who all work together to provide you with the care you need, when you need it.  We recommend signing up for the patient portal called "MyChart".  Sign up information is provided on this After Visit Summary.  MyChart is used to connect with patients for Virtual Visits (Telemedicine).  Patients are able to view lab/test results, encounter notes, upcoming appointments, etc.  Non-urgent messages can be sent to your provider as well.   To learn more about what you can do with MyChart, go to [email or web address].    Your next appointment:   12 month(s)  The format for your next appointment:   In Person  Provider:   Candee Furbish, MD   Thank you for choosing [facility]!!        Signed, Candee Furbish, MD  10/12/2020 10:12 AM    [place]

## 2020-10-13 DIAGNOSIS — M25562 Pain in left knee: Secondary | ICD-10-CM | POA: Diagnosis not present

## 2020-10-16 ENCOUNTER — Other Ambulatory Visit: Payer: Self-pay

## 2020-10-16 DIAGNOSIS — M25562 Pain in left knee: Secondary | ICD-10-CM | POA: Diagnosis not present

## 2020-10-18 DIAGNOSIS — M25562 Pain in left knee: Secondary | ICD-10-CM | POA: Diagnosis not present

## 2020-10-25 DIAGNOSIS — M25562 Pain in left knee: Secondary | ICD-10-CM | POA: Diagnosis not present

## 2020-10-27 DIAGNOSIS — M25562 Pain in left knee: Secondary | ICD-10-CM | POA: Diagnosis not present

## 2020-11-27 DIAGNOSIS — K219 Gastro-esophageal reflux disease without esophagitis: Secondary | ICD-10-CM | POA: Diagnosis not present

## 2020-11-27 DIAGNOSIS — M1711 Unilateral primary osteoarthritis, right knee: Secondary | ICD-10-CM | POA: Diagnosis not present

## 2020-11-27 DIAGNOSIS — M1710 Unilateral primary osteoarthritis, unspecified knee: Secondary | ICD-10-CM | POA: Diagnosis not present

## 2020-11-27 DIAGNOSIS — N401 Enlarged prostate with lower urinary tract symptoms: Secondary | ICD-10-CM | POA: Diagnosis not present

## 2020-11-27 DIAGNOSIS — E785 Hyperlipidemia, unspecified: Secondary | ICD-10-CM | POA: Diagnosis not present

## 2020-11-27 DIAGNOSIS — N400 Enlarged prostate without lower urinary tract symptoms: Secondary | ICD-10-CM | POA: Diagnosis not present

## 2020-11-27 DIAGNOSIS — M170 Bilateral primary osteoarthritis of knee: Secondary | ICD-10-CM | POA: Diagnosis not present

## 2020-12-01 ENCOUNTER — Other Ambulatory Visit: Payer: Self-pay

## 2020-12-05 DIAGNOSIS — D0430 Carcinoma in situ of skin of unspecified part of face: Secondary | ICD-10-CM | POA: Diagnosis not present

## 2020-12-05 DIAGNOSIS — L821 Other seborrheic keratosis: Secondary | ICD-10-CM | POA: Diagnosis not present

## 2020-12-06 DIAGNOSIS — Z8616 Personal history of COVID-19: Secondary | ICD-10-CM

## 2020-12-06 HISTORY — DX: Personal history of COVID-19: Z86.16

## 2020-12-12 ENCOUNTER — Other Ambulatory Visit: Payer: Self-pay

## 2020-12-12 ENCOUNTER — Ambulatory Visit (INDEPENDENT_AMBULATORY_CARE_PROVIDER_SITE_OTHER)
Admission: RE | Admit: 2020-12-12 | Discharge: 2020-12-12 | Disposition: A | Discharge status: Home / Self Care | Payer: Self-pay | Source: Ambulatory Visit | Attending: Cardiology | Admitting: Cardiology

## 2020-12-12 DIAGNOSIS — Z8249 Family history of ischemic heart disease and other diseases of the circulatory system: Secondary | ICD-10-CM

## 2020-12-12 DIAGNOSIS — I700 Atherosclerosis of aorta: Secondary | ICD-10-CM

## 2020-12-14 DIAGNOSIS — M25562 Pain in left knee: Secondary | ICD-10-CM | POA: Diagnosis not present

## 2020-12-15 DIAGNOSIS — N5201 Erectile dysfunction due to arterial insufficiency: Secondary | ICD-10-CM | POA: Diagnosis not present

## 2020-12-15 DIAGNOSIS — N401 Enlarged prostate with lower urinary tract symptoms: Secondary | ICD-10-CM | POA: Diagnosis not present

## 2020-12-15 DIAGNOSIS — K4091 Unilateral inguinal hernia, without obstruction or gangrene, recurrent: Secondary | ICD-10-CM | POA: Diagnosis not present

## 2020-12-15 DIAGNOSIS — R3912 Poor urinary stream: Secondary | ICD-10-CM | POA: Diagnosis not present

## 2020-12-19 DIAGNOSIS — M25562 Pain in left knee: Secondary | ICD-10-CM | POA: Diagnosis not present

## 2021-01-15 ENCOUNTER — Other Ambulatory Visit: Payer: Medicare Other

## 2021-01-17 DIAGNOSIS — Z96652 Presence of left artificial knee joint: Secondary | ICD-10-CM | POA: Diagnosis not present

## 2021-01-18 ENCOUNTER — Other Ambulatory Visit: Payer: Self-pay

## 2021-01-18 ENCOUNTER — Inpatient Hospital Stay: Payer: Medicare Other | Attending: Oncology

## 2021-01-18 LAB — FERRITIN: Ferritin: 37 ng/mL (ref 24–336)

## 2021-01-19 ENCOUNTER — Other Ambulatory Visit: Payer: Medicare Other | Admitting: *Deleted

## 2021-01-19 DIAGNOSIS — Z79899 Other long term (current) drug therapy: Secondary | ICD-10-CM | POA: Diagnosis not present

## 2021-01-19 DIAGNOSIS — E782 Mixed hyperlipidemia: Secondary | ICD-10-CM | POA: Diagnosis not present

## 2021-01-19 LAB — LIPID PANEL
Chol/HDL Ratio: 1.9 ratio (ref 0.0–5.0)
Cholesterol, Total: 115 mg/dL (ref 100–199)
HDL: 61 mg/dL (ref >39)
LDL Chol Calc (NIH): 42 mg/dL (ref 0–99)
Triglycerides: 52 mg/dL (ref 0–149)
VLDL Cholesterol Cal: 12 mg/dL (ref 5–40)

## 2021-01-19 LAB — ALT: ALT: 32 IU/L (ref 0–44)

## 2021-01-22 ENCOUNTER — Telehealth: Payer: Self-pay

## 2021-01-22 DIAGNOSIS — M1711 Unilateral primary osteoarthritis, right knee: Secondary | ICD-10-CM | POA: Diagnosis not present

## 2021-01-22 DIAGNOSIS — N400 Enlarged prostate without lower urinary tract symptoms: Secondary | ICD-10-CM | POA: Diagnosis not present

## 2021-01-22 DIAGNOSIS — N401 Enlarged prostate with lower urinary tract symptoms: Secondary | ICD-10-CM | POA: Diagnosis not present

## 2021-01-22 DIAGNOSIS — M1710 Unilateral primary osteoarthritis, unspecified knee: Secondary | ICD-10-CM | POA: Diagnosis not present

## 2021-01-22 DIAGNOSIS — E785 Hyperlipidemia, unspecified: Secondary | ICD-10-CM | POA: Diagnosis not present

## 2021-01-22 DIAGNOSIS — M170 Bilateral primary osteoarthritis of knee: Secondary | ICD-10-CM | POA: Diagnosis not present

## 2021-01-22 DIAGNOSIS — K219 Gastro-esophageal reflux disease without esophagitis: Secondary | ICD-10-CM | POA: Diagnosis not present

## 2021-01-22 NOTE — Telephone Encounter (Signed)
Please call patient, ferritin in goal range, f/u as scheduled - message left

## 2021-01-23 ENCOUNTER — Encounter: Payer: Self-pay | Admitting: *Deleted

## 2021-01-25 ENCOUNTER — Other Ambulatory Visit: Payer: Medicare Other

## 2021-02-19 DIAGNOSIS — E559 Vitamin D deficiency, unspecified: Secondary | ICD-10-CM | POA: Diagnosis not present

## 2021-02-21 DIAGNOSIS — I700 Atherosclerosis of aorta: Secondary | ICD-10-CM | POA: Diagnosis not present

## 2021-02-21 DIAGNOSIS — K4090 Unilateral inguinal hernia, without obstruction or gangrene, not specified as recurrent: Secondary | ICD-10-CM | POA: Diagnosis not present

## 2021-02-21 DIAGNOSIS — R413 Other amnesia: Secondary | ICD-10-CM | POA: Diagnosis not present

## 2021-02-21 DIAGNOSIS — G479 Sleep disorder, unspecified: Secondary | ICD-10-CM | POA: Diagnosis not present

## 2021-02-21 DIAGNOSIS — H9190 Unspecified hearing loss, unspecified ear: Secondary | ICD-10-CM | POA: Diagnosis not present

## 2021-02-21 DIAGNOSIS — E559 Vitamin D deficiency, unspecified: Secondary | ICD-10-CM | POA: Diagnosis not present

## 2021-02-21 DIAGNOSIS — Z0000 Encounter for general adult medical examination without abnormal findings: Secondary | ICD-10-CM | POA: Diagnosis not present

## 2021-02-21 DIAGNOSIS — N401 Enlarged prostate with lower urinary tract symptoms: Secondary | ICD-10-CM | POA: Diagnosis not present

## 2021-02-21 DIAGNOSIS — E785 Hyperlipidemia, unspecified: Secondary | ICD-10-CM | POA: Diagnosis not present

## 2021-02-26 DIAGNOSIS — Z23 Encounter for immunization: Secondary | ICD-10-CM | POA: Diagnosis not present

## 2021-04-11 DIAGNOSIS — Z85828 Personal history of other malignant neoplasm of skin: Secondary | ICD-10-CM | POA: Diagnosis not present

## 2021-04-11 DIAGNOSIS — L820 Inflamed seborrheic keratosis: Secondary | ICD-10-CM | POA: Diagnosis not present

## 2021-04-11 DIAGNOSIS — L905 Scar conditions and fibrosis of skin: Secondary | ICD-10-CM | POA: Diagnosis not present

## 2021-04-11 DIAGNOSIS — L728 Other follicular cysts of the skin and subcutaneous tissue: Secondary | ICD-10-CM | POA: Diagnosis not present

## 2021-04-11 DIAGNOSIS — D225 Melanocytic nevi of trunk: Secondary | ICD-10-CM | POA: Diagnosis not present

## 2021-04-11 DIAGNOSIS — Z23 Encounter for immunization: Secondary | ICD-10-CM | POA: Diagnosis not present

## 2021-04-11 DIAGNOSIS — L570 Actinic keratosis: Secondary | ICD-10-CM | POA: Diagnosis not present

## 2021-04-11 DIAGNOSIS — L578 Other skin changes due to chronic exposure to nonionizing radiation: Secondary | ICD-10-CM | POA: Diagnosis not present

## 2021-04-11 DIAGNOSIS — L814 Other melanin hyperpigmentation: Secondary | ICD-10-CM | POA: Diagnosis not present

## 2021-04-11 DIAGNOSIS — L821 Other seborrheic keratosis: Secondary | ICD-10-CM | POA: Diagnosis not present

## 2021-04-11 DIAGNOSIS — D485 Neoplasm of uncertain behavior of skin: Secondary | ICD-10-CM | POA: Diagnosis not present

## 2021-04-12 DIAGNOSIS — Z23 Encounter for immunization: Secondary | ICD-10-CM | POA: Diagnosis not present

## 2021-04-17 DIAGNOSIS — K219 Gastro-esophageal reflux disease without esophagitis: Secondary | ICD-10-CM | POA: Diagnosis not present

## 2021-04-17 DIAGNOSIS — N400 Enlarged prostate without lower urinary tract symptoms: Secondary | ICD-10-CM | POA: Diagnosis not present

## 2021-04-17 DIAGNOSIS — N401 Enlarged prostate with lower urinary tract symptoms: Secondary | ICD-10-CM | POA: Diagnosis not present

## 2021-04-17 DIAGNOSIS — M1710 Unilateral primary osteoarthritis, unspecified knee: Secondary | ICD-10-CM | POA: Diagnosis not present

## 2021-04-17 DIAGNOSIS — M1711 Unilateral primary osteoarthritis, right knee: Secondary | ICD-10-CM | POA: Diagnosis not present

## 2021-04-17 DIAGNOSIS — E785 Hyperlipidemia, unspecified: Secondary | ICD-10-CM | POA: Diagnosis not present

## 2021-04-17 DIAGNOSIS — M170 Bilateral primary osteoarthritis of knee: Secondary | ICD-10-CM | POA: Diagnosis not present

## 2021-04-25 DIAGNOSIS — H903 Sensorineural hearing loss, bilateral: Secondary | ICD-10-CM | POA: Diagnosis not present

## 2021-04-25 DIAGNOSIS — Z822 Family history of deafness and hearing loss: Secondary | ICD-10-CM | POA: Diagnosis not present

## 2021-04-25 DIAGNOSIS — Z77122 Contact with and (suspected) exposure to noise: Secondary | ICD-10-CM | POA: Diagnosis not present

## 2021-05-07 DIAGNOSIS — D123 Benign neoplasm of transverse colon: Secondary | ICD-10-CM | POA: Diagnosis not present

## 2021-05-07 DIAGNOSIS — K5730 Diverticulosis of large intestine without perforation or abscess without bleeding: Secondary | ICD-10-CM | POA: Diagnosis not present

## 2021-05-07 DIAGNOSIS — D120 Benign neoplasm of cecum: Secondary | ICD-10-CM | POA: Diagnosis not present

## 2021-05-07 DIAGNOSIS — D127 Benign neoplasm of rectosigmoid junction: Secondary | ICD-10-CM | POA: Diagnosis not present

## 2021-05-07 DIAGNOSIS — D128 Benign neoplasm of rectum: Secondary | ICD-10-CM | POA: Diagnosis not present

## 2021-05-07 DIAGNOSIS — Z86010 Personal history of colonic polyps: Secondary | ICD-10-CM | POA: Diagnosis not present

## 2021-05-09 DIAGNOSIS — D123 Benign neoplasm of transverse colon: Secondary | ICD-10-CM | POA: Diagnosis not present

## 2021-05-09 DIAGNOSIS — D127 Benign neoplasm of rectosigmoid junction: Secondary | ICD-10-CM | POA: Diagnosis not present

## 2021-05-09 DIAGNOSIS — D120 Benign neoplasm of cecum: Secondary | ICD-10-CM | POA: Diagnosis not present

## 2021-05-09 DIAGNOSIS — D128 Benign neoplasm of rectum: Secondary | ICD-10-CM | POA: Diagnosis not present

## 2021-05-21 ENCOUNTER — Inpatient Hospital Stay: Payer: Medicare Other

## 2021-05-21 ENCOUNTER — Inpatient Hospital Stay: Payer: Medicare Other | Attending: Oncology | Admitting: Oncology

## 2021-05-21 ENCOUNTER — Other Ambulatory Visit: Payer: Self-pay

## 2021-05-21 DIAGNOSIS — N400 Enlarged prostate without lower urinary tract symptoms: Secondary | ICD-10-CM | POA: Diagnosis not present

## 2021-05-21 DIAGNOSIS — Z96653 Presence of artificial knee joint, bilateral: Secondary | ICD-10-CM | POA: Insufficient documentation

## 2021-05-21 DIAGNOSIS — H9193 Unspecified hearing loss, bilateral: Secondary | ICD-10-CM | POA: Insufficient documentation

## 2021-05-21 LAB — FERRITIN: Ferritin: 41 ng/mL (ref 24–336)

## 2021-05-21 NOTE — Progress Notes (Signed)
  [place] OFFICE PROGRESS NOTE   Diagnosis: Hemochromatosis  INTERVAL HISTORY:   Mr. Liew returns as scheduled.  He reports limited ability to ambulate secondary to discomfort at the left knee.  He sees Dr.Aluisio this week.  He reports chronic hearing loss.  He is traveling.  Objective:  Vital signs in last 24 hours:  Blood pressure (!) 147/69, pulse 80, temperature 98.1 F (36.7 C), temperature source Oral, resp. rate 20, height 6\' 1"  (1.854 m), weight 169 lb 3.2 oz (76.7 kg), SpO2 98 %.   Resp: Lungs clear bilaterally Cardio: Regular rate and rhythm GI: No hepatosplenomegaly Vascular: No leg edema   Lab Results:  Lab Results  Component Value Date   WBC 6.0 09/15/2020   HGB 14.8 09/15/2020   HCT 44.8 09/15/2020   MCV 94.5 09/15/2020   PLT 196 09/15/2020   NEUTROABS 3.5 09/15/2020    CMP  Lab Results  Component Value Date   NA 140 10/19/2018   K 4.5 10/19/2018   CL 106 10/19/2018   CO2 24 10/19/2018   GLUCOSE 84 10/19/2018   BUN 21 10/19/2018   CREATININE 0.79 10/19/2018   CALCIUM 9.2 10/19/2018   PROT 6.9 09/07/2019   ALBUMIN 4.6 09/07/2019   AST 57 (H) 09/07/2019   ALT 32 01/19/2021   ALKPHOS 80 09/07/2019   BILITOT 0.7 09/07/2019   GFRNONAA >60 10/19/2018   GFRAA >60 10/19/2018     Medications: I have reviewed the patient's current medications.   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   BPH, status post TUR   3.   History of positional vertigo   4.   Bilateral hearing loss 5.   Knee arthritis-status post bilateral knee replacement   Disposition: Mr Guandique has hereditary hemochromatosis.  The ferritin level was in  goal range when he was here in July.  We will follow-up on the ferritin level from today.  He will return for a ferritin level in 4 months and 8 months.  He will be scheduled for an office visit in 1 year.  Betsy Coder, MD  05/21/2021  9:48 AM

## 2021-05-22 ENCOUNTER — Telehealth: Payer: Self-pay | Admitting: *Deleted

## 2021-05-22 NOTE — Telephone Encounter (Signed)
Mrs. Blatt notified of ferritin in goal range. F/U as scheduled. She will forward message to her husband.

## 2021-05-22 NOTE — Telephone Encounter (Signed)
-----   Message from Ladell Pier, MD sent at 05/22/2021  7:10 AM EST ----- Please call patient, ferritin remains in goal range, f/u as scheduled

## 2021-05-24 DIAGNOSIS — Z96652 Presence of left artificial knee joint: Secondary | ICD-10-CM | POA: Diagnosis not present

## 2021-06-07 DIAGNOSIS — M25562 Pain in left knee: Secondary | ICD-10-CM | POA: Diagnosis not present

## 2021-06-11 DIAGNOSIS — Z77122 Contact with and (suspected) exposure to noise: Secondary | ICD-10-CM | POA: Diagnosis not present

## 2021-06-11 DIAGNOSIS — H903 Sensorineural hearing loss, bilateral: Secondary | ICD-10-CM | POA: Diagnosis not present

## 2021-06-11 DIAGNOSIS — Z822 Family history of deafness and hearing loss: Secondary | ICD-10-CM | POA: Diagnosis not present

## 2021-06-13 DIAGNOSIS — M25562 Pain in left knee: Secondary | ICD-10-CM | POA: Diagnosis not present

## 2021-06-15 DIAGNOSIS — M25562 Pain in left knee: Secondary | ICD-10-CM | POA: Diagnosis not present

## 2021-06-18 DIAGNOSIS — M25562 Pain in left knee: Secondary | ICD-10-CM | POA: Diagnosis not present

## 2021-06-25 DIAGNOSIS — Z96652 Presence of left artificial knee joint: Secondary | ICD-10-CM | POA: Diagnosis not present

## 2021-06-26 DIAGNOSIS — Z96652 Presence of left artificial knee joint: Secondary | ICD-10-CM | POA: Diagnosis not present

## 2021-07-04 DIAGNOSIS — Z96652 Presence of left artificial knee joint: Secondary | ICD-10-CM | POA: Diagnosis not present

## 2021-07-05 DIAGNOSIS — Z96652 Presence of left artificial knee joint: Secondary | ICD-10-CM | POA: Diagnosis not present

## 2021-07-06 DIAGNOSIS — Z96652 Presence of left artificial knee joint: Secondary | ICD-10-CM | POA: Diagnosis not present

## 2021-07-09 DIAGNOSIS — R319 Hematuria, unspecified: Secondary | ICD-10-CM | POA: Diagnosis not present

## 2021-07-09 DIAGNOSIS — Z87440 Personal history of urinary (tract) infections: Secondary | ICD-10-CM | POA: Diagnosis not present

## 2021-07-13 DIAGNOSIS — N401 Enlarged prostate with lower urinary tract symptoms: Secondary | ICD-10-CM | POA: Diagnosis not present

## 2021-07-13 DIAGNOSIS — R310 Gross hematuria: Secondary | ICD-10-CM | POA: Diagnosis not present

## 2021-07-13 DIAGNOSIS — R3912 Poor urinary stream: Secondary | ICD-10-CM | POA: Diagnosis not present

## 2021-07-16 DIAGNOSIS — N401 Enlarged prostate with lower urinary tract symptoms: Secondary | ICD-10-CM | POA: Diagnosis not present

## 2021-07-16 DIAGNOSIS — R3912 Poor urinary stream: Secondary | ICD-10-CM | POA: Diagnosis not present

## 2021-07-16 DIAGNOSIS — R310 Gross hematuria: Secondary | ICD-10-CM | POA: Diagnosis not present

## 2021-07-19 DIAGNOSIS — M25562 Pain in left knee: Secondary | ICD-10-CM | POA: Diagnosis not present

## 2021-07-25 DIAGNOSIS — M25562 Pain in left knee: Secondary | ICD-10-CM | POA: Diagnosis not present

## 2021-07-27 DIAGNOSIS — M25562 Pain in left knee: Secondary | ICD-10-CM | POA: Diagnosis not present

## 2021-07-31 DIAGNOSIS — M25562 Pain in left knee: Secondary | ICD-10-CM | POA: Diagnosis not present

## 2021-08-02 DIAGNOSIS — M25562 Pain in left knee: Secondary | ICD-10-CM | POA: Diagnosis not present

## 2021-08-07 DIAGNOSIS — M25562 Pain in left knee: Secondary | ICD-10-CM | POA: Diagnosis not present

## 2021-08-09 DIAGNOSIS — M25562 Pain in left knee: Secondary | ICD-10-CM | POA: Diagnosis not present

## 2021-08-14 DIAGNOSIS — M25562 Pain in left knee: Secondary | ICD-10-CM | POA: Diagnosis not present

## 2021-08-16 DIAGNOSIS — M25562 Pain in left knee: Secondary | ICD-10-CM | POA: Diagnosis not present

## 2021-09-10 ENCOUNTER — Other Ambulatory Visit: Payer: Self-pay

## 2021-09-10 ENCOUNTER — Ambulatory Visit: Payer: Medicare Other | Attending: Orthopedic Surgery | Admitting: Physical Therapy

## 2021-09-10 ENCOUNTER — Encounter: Payer: Self-pay | Admitting: Physical Therapy

## 2021-09-10 DIAGNOSIS — M25562 Pain in left knee: Secondary | ICD-10-CM | POA: Diagnosis not present

## 2021-09-10 DIAGNOSIS — R262 Difficulty in walking, not elsewhere classified: Secondary | ICD-10-CM | POA: Diagnosis not present

## 2021-09-10 DIAGNOSIS — G8929 Other chronic pain: Secondary | ICD-10-CM | POA: Diagnosis not present

## 2021-09-10 NOTE — Patient Instructions (Signed)

## 2021-09-10 NOTE — Therapy (Signed)
[facility] [address] ?[place], [place], [address] ?Phone: [phone]   Fax:  [phone] ? ?Physical Therapy Evaluation ? ?Patient Details  ?Name: Nicholas Mahoney ?MRN: [id number] ?Date of Birth: Sep 11, 1945 ?Referring Provider (PT): Aluisio ? ? ?Encounter Date: 09/10/2021 ? ? PT End of Session - 09/10/21 1620   ? ? Visit Number 1   ? Date for PT Re-Evaluation 12/11/21   ? Authorization Type [facility]   ? PT Start Time 1524   ? PT Stop Time 1610   ? PT Time Calculation (min) 46 min   ? Activity Tolerance Patient tolerated treatment well   ? Behavior During Therapy [facility] for tasks assessed/performed   ? ?  ?  ? ?  ? ? ?Past Medical History:  ?Diagnosis Date  ? Bladder outlet obstruction   ? BPH (benign prostatic hyperplasia)   ? ED (erectile dysfunction)   ? Hyperlipidemia   ? Lower urinary tract symptoms (LUTS)   ? Migraines   ? none recent  ? 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 no drops  ? OA (osteoarthritis) of knee   ? Vitamin D deficiency   ? Wears glasses   ? Wears hearing aid   ? bilateral  ? ? ?Past Surgical History:  ?Procedure Laterality Date  ? APPENDECTOMY  1980's  ? CARDIAC CATHETERIZATION  01-26-2008  dr Marlou Porch  ? abnormal myoview/  no sig. cad,  normal LVF,  ef 60%,  false positive myoview  ? GREEN LIGHT LASER TURP (TRANSURETHRAL RESECTION OF PROSTATE N/A 06/13/2015  ? Procedure: GREEN LIGHT LASER TURP (TRANSURETHRAL RESECTION OF PROSTATE;  Surgeon: [name], MD;  Location: [facility];  Service: Urology;  Laterality: N/A;  ? HERNIA REPAIR  infant  ? KNEE ARTHROSCOPY W/ MENISCECTOMY Bilateral left  10-04-2008/  right 11-09-2007  ? and chondraplasty/  synovectomy  ? TRANSURETHRAL RESECTION OF PROSTATE N/A 05/18/2019  ? Procedure: TRANSURETHRAL RESECTION OF THE PROSTATE (TURP);  Surgeon: [name], MD;  Location: [facility];  Service: Urology;  Laterality: N/A;  ? VASECTOMY  1980's  ? ? ?There were no vitals  filed for this visit. ? ? ? Subjective Assessment - 09/10/21 1529   ? ? Subjective Patient had a left TKA in February 2022.  He reports that he has had PT at [facility] and in [place], he reports that the MD feels like the knee is good, reports just needs more strength, he reports that he was working out with a trainer for a while and just has not seem much results   ? Limitations Lifting;Standing;Walking;House hold activities   ? Patient Stated Goals less pain , walk better, faster walking   ? Currently in Pain? No/denies   ? Pain Location Knee   ? Pain Orientation Left   ? Pain Descriptors / Indicators Aching;Sore;Tightness   ? Pain Type Acute pain   ? Pain Onset More than a month ago   ? Pain Frequency Intermittent   ? Aggravating Factors  walking after sitting, stairs, pain up to 4-5/10   ? Pain Relieving Factors sit and rest   ? Effect of Pain on Daily Activities reports difficulty walking, difficulty on stairs   ? ?  ?  ? ?  ? ? ? ? ? [facility] PT Assessment - 09/10/21 0001   ? ?  ? Assessment  ? Medical Diagnosis left knee pain, difficulty walking   ? Referring Provider (PT) Aluisio   ? Onset  Date/Surgical Date 08/13/21   ? Prior Therapy in the past year for left TKA   ?  ? Precautions  ? Precautions None   ?  ? Balance Screen  ? Has the patient fallen in the past 6 months No   ? Has the patient had a decrease in activity level because of a fear of falling?  Yes   ? Is the patient reluctant to leave their home because of a fear of falling?  No   ?  ? Home Environment  ? Additional Comments has stairs, some light yardwork   ?  ? Prior Function  ? Level of Independence Independent   ? Vocation Retired   ? Leisure walkdog daily, works out with a Clinical research associate 2-3x/week   ?  ? ROM / Strength  ? AROM / PROM / Strength AROM;Strength   ?  ? AROM  ? AROM Assessment Site Knee   ? Right/Left Knee Left   ? Left Knee Extension 18   ? Left Knee Flexion 112   ?  ? Strength  ? Overall Strength Comments hip 4/5, left knee flexion  4/5, left knee extension 4-/5   ?  ? Flexibility  ? Soft Tissue Assessment /Muscle Length yes   ? Hamstrings very tight   ? Quadriceps very tight   ? ITB very tight   ?  ? Palpation  ? Palpation comment poor VMO, lateral tracking patella, very tight ITB and vastus lateralis   ?  ? Ambulation/Gait  ? Gait Comments without device, slow, and seems to want to use walls and furniture to hold onto, he has a slight dip at the hip with left stance phase could be due to lack of knee extension or the weakness in the knee   ? ?  ?  ? ?  ? ? ? ? ? ? ? ? ? ? ? ? ? ?Objective measurements completed on examination: See above findings.  ? ? ? ? ? ? ? Trigger Point Dry Needling - 09/10/21 0001   ? ? Consent Given? Yes   ? Education Handout Provided Yes   ? Muscles Treated Lower Quadrant Vastus lateralis;Hamstring   ? Vastus lateralis Response Twitch response elicited;Palpable increased muscle length   ? Hamstring Response Twitch response elicited;Palpable increased muscle length   ? ?  ?  ? ?  ? ? ? ? ? ? ? ? ? ? PT Short Term Goals - 09/10/21 1852   ? ?  ? PT SHORT TERM GOAL #1  ? Title independent with initial HEP   ? Time 2   ? Period Weeks   ? Status New   ? ?  ?  ? ?  ? ? ? ? PT Long Term Goals - 09/10/21 1852   ? ?  ? PT LONG TERM GOAL #1  ? Title decrease pain with walking 50%   ? Time 12   ? Period Weeks   ? Status New   ?  ? PT LONG TERM GOAL #2  ? Title increae left knee extension to 8 degrees from full extension   ? Time 12   ? Period Weeks   ? Status New   ?  ? PT LONG TERM GOAL #3  ? Title walkwithout device and without using furniture or walls for balance after sitting   ? Time 12   ? Period Weeks   ? Status New   ?  ? PT LONG TERM GOAL #4  ?  Title independent with advanced HEP   ? Time 12   ? Period Weeks   ? Status New   ? ?  ?  ? ?  ? ? ? ? ? ? ? ? ? Plan - 09/10/21 1621   ? ? Clinical Impression Statement Patient had a left TKA in February of 2022, he had PT in [place] and with [facility], he reports that the  knee just never got better, he reports pain, weakness and difficulty walking, with walking he does not use a device, but tends to use the walls and other items to steady himself, he reports pain in the left leg after any sitting and then getting up to walk, he has limited extension, poor VMO, some lateral tracking of the patella, he is very tight in the HS, and ITB, he is tender in the ITB and vastus lateralis.  When walking he tends to have a drop on the left at stance phase, I cannot tell if it is due to weakness or the lack of knee extension, he does have some weakness in the left hip.  MD feels like the knee is stable.   ? Stability/Clinical Decision Making Stable/Uncomplicated   ? Clinical Decision Making Low   ? Rehab Potential Good   ? PT Frequency 1x / week   ? PT Duration 12 weeks   ? PT Treatment/Interventions ADLs/Self Care Home Management;Electrical Stimulation;Gait training;Stair training;Functional mobility training;Therapeutic activities;Therapeutic exercise;Balance training;Neuromuscular re-education;Manual techniques;Patient/family education;Dry needling   ? PT Next Visit Plan needs more TKE, increase VMO strength, hip IR, stretch, HS and ITB   ? Consulted and Agree with Plan of Care Patient   ? ?  ?  ? ?  ? ? ?Patient will benefit from skilled therapeutic intervention in order to improve the following deficits and impairments:  Abnormal gait, Decreased range of motion, Difficulty walking, Decreased activity tolerance, Pain, Decreased balance, Impaired flexibility, Decreased strength, Decreased mobility ? ?Visit Diagnosis: ?Chronic pain of left knee - Plan: PT plan of care cert/re-cert ? ?Difficulty in walking, not elsewhere classified - Plan: PT plan of care cert/re-cert ? ? ? ? ?Problem List ?Patient Active Problem List  ? Diagnosis Date Noted  ? Hereditary hemochromatosis ([place]) 04/10/2017  ? ? Sumner Boast, PT ?09/10/2021, 6:56 PM ? ?[facility] ?[address] ?[address] ?[place], [place], [address] ?Phone: [phone]   Fax:  [phone] ? ?Name: ARMANI BRAR ?MRN: [id number] ?Date of Birth: 02-24-1946 ? ? ?

## 2021-09-17 DIAGNOSIS — R3915 Urgency of urination: Secondary | ICD-10-CM | POA: Diagnosis not present

## 2021-09-17 DIAGNOSIS — N401 Enlarged prostate with lower urinary tract symptoms: Secondary | ICD-10-CM | POA: Diagnosis not present

## 2021-09-17 DIAGNOSIS — R310 Gross hematuria: Secondary | ICD-10-CM | POA: Diagnosis not present

## 2021-09-18 ENCOUNTER — Other Ambulatory Visit: Payer: Self-pay

## 2021-09-18 ENCOUNTER — Inpatient Hospital Stay: Payer: Medicare Other | Attending: Oncology

## 2021-09-18 LAB — FERRITIN: Ferritin: 41 ng/mL (ref 24–336)

## 2021-09-19 ENCOUNTER — Telehealth: Payer: Self-pay

## 2021-09-19 ENCOUNTER — Ambulatory Visit: Payer: Medicare Other | Admitting: Physical Therapy

## 2021-09-19 ENCOUNTER — Encounter: Payer: Self-pay | Admitting: Physical Therapy

## 2021-09-19 DIAGNOSIS — M25562 Pain in left knee: Secondary | ICD-10-CM | POA: Diagnosis not present

## 2021-09-19 DIAGNOSIS — R262 Difficulty in walking, not elsewhere classified: Secondary | ICD-10-CM

## 2021-09-19 DIAGNOSIS — G8929 Other chronic pain: Secondary | ICD-10-CM

## 2021-09-19 NOTE — Telephone Encounter (Signed)
-----   Message from Ladell Pier, MD sent at 09/18/2021  4:28 PM EDT ----- ?Please call patient, ferritin remains in goal range, follow-up as scheduled ? ?

## 2021-09-19 NOTE — Therapy (Signed)
[place] ?[place] [address] ?[address] ?[place], [place], [address] ?Phone: [phone]   Fax:  [phone] ? ?Physical Therapy Treatment ? ?Patient Details  ?Name: Nicholas Mahoney ?MRN: [id number] ?Date of Birth: May 29, 1946 ?Referring Provider (PT): Aluisio ? ? ?Encounter Date: 09/19/2021 ? ? PT End of Session - 09/19/21 0926   ? ? Visit Number 2   ? Date for PT Re-Evaluation 12/11/21   ? PT Start Time 0845   ? PT Stop Time [phone]   ? PT Time Calculation (min) 43 min   ? Activity Tolerance Patient tolerated treatment well   ? Behavior During Therapy [facility] for tasks assessed/performed   ? ?  ?  ? ?  ? ? ?Past Medical History:  ?Diagnosis Date  ? Bladder outlet obstruction   ? BPH (benign prostatic hyperplasia)   ? ED (erectile dysfunction)   ? Hyperlipidemia   ? Lower urinary tract symptoms (LUTS)   ? Migraines   ? none recent  ? 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 no drops  ? OA (osteoarthritis) of knee   ? Vitamin D deficiency   ? Wears glasses   ? Wears hearing aid   ? bilateral  ? ? ?Past Surgical History:  ?Procedure Laterality Date  ? APPENDECTOMY  1980's  ? CARDIAC CATHETERIZATION  01-26-2008  dr Marlou Porch  ? abnormal myoview/  no sig. cad,  normal LVF,  ef 60%,  false positive myoview  ? GREEN LIGHT LASER TURP (TRANSURETHRAL RESECTION OF PROSTATE N/A 06/13/2015  ? Procedure: GREEN LIGHT LASER TURP (TRANSURETHRAL RESECTION OF PROSTATE;  Surgeon: [name], MD;  Location: [facility];  Service: Urology;  Laterality: N/A;  ? HERNIA REPAIR  infant  ? KNEE ARTHROSCOPY W/ MENISCECTOMY Bilateral left  10-04-2008/  right 11-09-2007  ? and chondraplasty/  synovectomy  ? TRANSURETHRAL RESECTION OF PROSTATE N/A 05/18/2019  ? Procedure: TRANSURETHRAL RESECTION OF THE PROSTATE (TURP);  Surgeon: [name], MD;  Location: [facility];  Service: Urology;  Laterality: N/A;  ? VASECTOMY  1980's  ? ? ?There were no vitals filed for this visit. ? ?  Subjective Assessment - 09/19/21 0846   ? ? Subjective A long time without improvement. LLE weak and stiff   ? Currently in Pain? No/denies   ? ?  ?  ? ?  ? ? ? ? ? ? ? ? ? ? ? ? ? ? ? ? ? ? ? ? [place] Adult PT Treatment/Exercise - 09/19/21 0001   ? ?  ? Exercises  ? Exercises Knee/Hip   ?  ? Knee/Hip Exercises: Aerobic  ? Nustep L5 x 6 min   ?  ? Knee/Hip Exercises: Machines for Strengthening  ? Cybex Knee Extension LLE 5lb x 10, 10lb x10, LLe 20lb eccentrics x10   ? Cybex Knee Flexion LLW 20lb x10, 25lb x10   ? Cybex Leg Press 40lb x10 LLE 20lb 2x5, LLE TKE x5 30lb   ?  ? Knee/Hip Exercises: Standing  ? Heel Raises Both;1 set;10 reps;2 seconds   ? Walking with Sports Cord 30lb 4 way x 3 each   ?  ? Knee/Hip Exercises: Seated  ? Sit to Sand 10 reps;without UE support;2 sets   2nd set on airex  ?  ? Manual Therapy  ? Manual Therapy Passive ROM   ? Passive ROM L knee flex and Ext   ? ?  ?  ? ?  ? ? ? ? ? ? ? ? ? ? ? ?  PT Short Term Goals - 09/10/21 1852   ? ?  ? PT SHORT TERM GOAL #1  ? Title independent with initial HEP   ? Time 2   ? Period Weeks   ? Status New   ? ?  ?  ? ?  ? ? ? ? PT Long Term Goals - 09/10/21 1852   ? ?  ? PT LONG TERM GOAL #1  ? Title decrease pain with walking 50%   ? Time 12   ? Period Weeks   ? Status New   ?  ? PT LONG TERM GOAL #2  ? Title increae left knee extension to 8 degrees from full extension   ? Time 12   ? Period Weeks   ? Status New   ?  ? PT LONG TERM GOAL #3  ? Title walkwithout device and without using furniture or walls for balance after sitting   ? Time 12   ? Period Weeks   ? Status New   ?  ? PT LONG TERM GOAL #4  ? Title independent with advanced HEP   ? Time 12   ? Period Weeks   ? Status New   ? ?  ?  ? ?  ? ? ? ? ? ? ? ? Plan - 09/19/21 0927   ? ? Clinical Impression Statement Session consisted of strengthening and balance interventions. He had a difficult time with resisted side step. Tactile and verbal cues needed to prevent compensation with step ups. LLE is very  weak in comparison to his R. Limited L knee extension remains and is present throughout session. Bilateral calf weakness also noted with heel raises.   ? Stability/Clinical Decision Making Stable/Uncomplicated   ? Rehab Potential Good   ? PT Frequency 1x / week   ? PT Duration 12 weeks   ? PT Treatment/Interventions ADLs/Self Care Home Management;Electrical Stimulation;Gait training;Stair training;Functional mobility training;Therapeutic activities;Therapeutic exercise;Balance training;Neuromuscular re-education;Manual techniques;Patient/family education;Dry needling   ? PT Next Visit Plan needs more TKE, increase VMO strength, hip IR, stretch, HS and ITB   ? ?  ?  ? ?  ? ? ?Patient will benefit from skilled therapeutic intervention in order to improve the following deficits and impairments:  Abnormal gait, Decreased range of motion, Difficulty walking, Decreased activity tolerance, Pain, Decreased balance, Impaired flexibility, Decreased strength, Decreased mobility ? ?Visit Diagnosis: ?Chronic pain of left knee ? ?Difficulty in walking, not elsewhere classified ? ? ? ? ?Problem List ?Patient Active Problem List  ? Diagnosis Date Noted  ? 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 ? ?Scot Jun, PTA ?09/19/2021, 9:30 AM ? ?[place] ?[place] ?[address] ?[place], [place], [address] ?Phone: [phone]   Fax:  [phone] ? ?Name: Nicholas Mahoney ?MRN: [id number] ?Date of Birth: 08-11-1945 ? ? ? ?

## 2021-09-19 NOTE — Telephone Encounter (Signed)
Called and spoke with the patient to let him know his ferritin remains in goal range, follow-up as scheduled. Patient gave verbal understanding and denied further questions. ?

## 2021-09-26 ENCOUNTER — Ambulatory Visit: Payer: Medicare Other | Admitting: Physical Therapy

## 2021-09-26 ENCOUNTER — Other Ambulatory Visit: Payer: Self-pay

## 2021-09-26 ENCOUNTER — Encounter: Payer: Self-pay | Admitting: Physical Therapy

## 2021-09-26 DIAGNOSIS — G8929 Other chronic pain: Secondary | ICD-10-CM | POA: Diagnosis not present

## 2021-09-26 DIAGNOSIS — R262 Difficulty in walking, not elsewhere classified: Secondary | ICD-10-CM | POA: Diagnosis not present

## 2021-09-26 DIAGNOSIS — M25562 Pain in left knee: Secondary | ICD-10-CM | POA: Diagnosis not present

## 2021-09-26 NOTE — Therapy (Signed)
[place] ?[place] ?[address] ?[place], [place], [address] ?Phone: [phone]   Fax:  [phone] ? ?Physical Therapy Treatment ? ?Patient Details  ?Name: Nicholas Mahoney ?MRN: [id number] ?Date of Birth: 12/08/1945 ?Referring Provider (PT): Aluisio ? ? ?Encounter Date: 09/26/2021 ? ? PT End of Session - 09/26/21 6811   ? ? Visit Number 3   ? Date for PT Re-Evaluation 12/11/21   ? PT Start Time 0845   ? PT Stop Time 0930   ? PT Time Calculation (min) 45 min   ? Activity Tolerance Patient tolerated treatment well   ? Behavior During Therapy [facility] for tasks assessed/performed   ? ?  ?  ? ?  ? ? ?Past Medical History:  ?Diagnosis Date  ? Bladder outlet obstruction   ? BPH (benign prostatic hyperplasia)   ? ED (erectile dysfunction)   ? Hyperlipidemia   ? Lower urinary tract symptoms (LUTS)   ? Migraines   ? none recent  ? 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 no drops  ? OA (osteoarthritis) of knee   ? Vitamin D deficiency   ? Wears glasses   ? Wears hearing aid   ? bilateral  ? ? ?Past Surgical History:  ?Procedure Laterality Date  ? APPENDECTOMY  1980's  ? CARDIAC CATHETERIZATION  01-26-2008  dr Marlou Porch  ? abnormal myoview/  no sig. cad,  normal LVF,  ef 60%,  false positive myoview  ? GREEN LIGHT LASER TURP (TRANSURETHRAL RESECTION OF PROSTATE N/A 06/13/2015  ? Procedure: GREEN LIGHT LASER TURP (TRANSURETHRAL RESECTION OF PROSTATE;  Surgeon: [name], MD;  Location: [facility];  Service: Urology;  Laterality: N/A;  ? HERNIA REPAIR  infant  ? KNEE ARTHROSCOPY W/ MENISCECTOMY Bilateral left  10-04-2008/  right 11-09-2007  ? and chondraplasty/  synovectomy  ? TRANSURETHRAL RESECTION OF PROSTATE N/A 05/18/2019  ? Procedure: TRANSURETHRAL RESECTION OF THE PROSTATE (TURP);  Surgeon: [name], MD;  Location: [facility];  Service: Urology;  Laterality: N/A;  ? VASECTOMY  1980's  ? ? ?There were no vitals filed for this visit. ? ?  Subjective Assessment - 09/26/21 0847   ? ? Subjective things ding change quick, still having a fair amount of weakness and pain in the L leg   ? Currently in Pain? No/denies   ? ?  ?  ? ?  ? ? ? ? ? ? ? ? ? ? ? ? ? ? ? ? ? ? ? ? [place] Adult PT Treatment/Exercise - 09/26/21 0001   ? ?  ? Knee/Hip Exercises: Aerobic  ? Nustep L5 x 6 min LLE   ?  ? Knee/Hip Exercises: Machines for Strengthening  ? Cybex Knee Extension LLE 5lb x 10, 10lb 2x5, LLE 20lb eccentrics x10   ? Cybex Knee Flexion LLW 20lb x10, 25lb x10   ?  ? Knee/Hip Exercises: Standing  ? Lateral Step Up Left;1 set;10 reps;Hand Hold: 1;Step Height: 6"   ? Forward Step Up Left;1 set;10 reps;Hand Hold: 1;Step Height: 6"   ? Other Standing Knee Exercises wall squats with pball 2x15   ? Other Standing Knee Exercises LLE hip ext, abd, and add, 15lb x10 each   ? ?  ?  ? ?  ? ? ? ? ? ? ? ? ? ? ? ? PT Short Term Goals - 09/26/21 0925   ? ?  ? PT SHORT TERM GOAL #1  ? Title independent with initial  HEP   ? Status Achieved   ? ?  ?  ? ?  ? ? ? ? PT Long Term Goals - 09/26/21 0925   ? ?  ? PT LONG TERM GOAL #1  ? Title decrease pain with walking 50%   ? Status On-going   ?  ? PT LONG TERM GOAL #2  ? Title increae left knee extension to 8 degrees from full extension   ? Status On-going   ?  ? PT LONG TERM GOAL #3  ? Title walkwithout device and without using furniture or walls for balance after sitting   ? Status Partially Met   ? ?  ?  ? ?  ? ? ? ? ? ? ? ? Plan - 09/26/21 5146   ? ? Clinical Impression Statement Pt enters clinic feeling well overall with complaints of LLE weakness and pain. Pt can be unstable at times ambulating around clinic but able to regain balance on his own. Continues focus on LLE strength. LLE functional weakness noted with forward and lateral step ups. Cue to complete full ROM with seated leg curls and extensions. Lead PT assisting treatment providing pt with DN.   ? Stability/Clinical Decision Making Stable/Uncomplicated   ? Rehab Potential  Good   ? PT Frequency 1x / week   ? PT Duration 12 weeks   ? PT Treatment/Interventions ADLs/Self Care Home Management;Electrical Stimulation;Gait training;Stair training;Functional mobility training;Therapeutic activities;Therapeutic exercise;Balance training;Neuromuscular re-education;Manual techniques;Patient/family education;Dry needling   ? PT Next Visit Plan needs more TKE, increase VMO strength, hip IR, stretch, HS and ITB   ? ?  ?  ? ?  ? ? ?Patient will benefit from skilled therapeutic intervention in order to improve the following deficits and impairments:  Abnormal gait, Decreased range of motion, Difficulty walking, Decreased activity tolerance, Pain, Decreased balance, Impaired flexibility, Decreased strength, Decreased mobility ? ?Visit Diagnosis: ?Chronic pain of left knee ? ?Difficulty in walking, not elsewhere classified ? ? ? ? ?Problem List ?Patient Active Problem List  ? Diagnosis Date Noted  ? 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 ? ?Scot Jun, PTA ?09/26/2021, 10:11 AM ? ?[place] ?[address] ?[place], [place], [address] ?Phone: [phone]   Fax:  [phone] ? ?Name: Nicholas Mahoney ?MRN: [id number] ?Date of Birth: [birth year] ? ? ? ?

## 2021-10-03 ENCOUNTER — Ambulatory Visit: Payer: Medicare Other | Admitting: Physical Therapy

## 2021-10-04 ENCOUNTER — Ambulatory Visit: Payer: Medicare Other | Admitting: Physical Therapy

## 2021-10-04 ENCOUNTER — Encounter: Payer: Self-pay | Admitting: Physical Therapy

## 2021-10-04 DIAGNOSIS — M25562 Pain in left knee: Secondary | ICD-10-CM | POA: Diagnosis not present

## 2021-10-04 DIAGNOSIS — R262 Difficulty in walking, not elsewhere classified: Secondary | ICD-10-CM

## 2021-10-04 DIAGNOSIS — G8929 Other chronic pain: Secondary | ICD-10-CM | POA: Diagnosis not present

## 2021-10-04 NOTE — Therapy (Signed)
[name] ?[place] [address] ?[place], [place], [address] ?Phone: [phone]   Fax:  [phone] ? ?Physical Therapy Treatment ? ?Patient Details  ?Name: Nicholas Mahoney ?MRN: [id number] ?Date of Birth: 25-Jul-1945 ?Referring Provider (PT): Aluisio ? ? ?Encounter Date: 10/04/2021 ? ? PT End of Session - 10/04/21 1141   ? ? Visit Number 4   ? Date for PT Re-Evaluation 12/11/21   ? Authorization Type [employer]   ? PT Start Time 1100   ? PT Stop Time 1145   ? PT Time Calculation (min) 45 min   ? Activity Tolerance Patient tolerated treatment well   ? Behavior During Therapy [facility] for tasks assessed/performed   ? ?  ?  ? ?  ? ? ?Past Medical History:  ?Diagnosis Date  ? Bladder outlet obstruction   ? BPH (benign prostatic hyperplasia)   ? ED (erectile dysfunction)   ? Hyperlipidemia   ? Lower urinary tract symptoms (LUTS)   ? Migraines   ? none recent  ? 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 no drops  ? OA (osteoarthritis) of knee   ? Vitamin D deficiency   ? Wears glasses   ? Wears hearing aid   ? bilateral  ? ? ?Past Surgical History:  ?Procedure Laterality Date  ? APPENDECTOMY  1980's  ? CARDIAC CATHETERIZATION  01-26-2008  dr Marlou Porch  ? abnormal myoview/  no sig. cad,  normal LVF,  ef 60%,  false positive myoview  ? GREEN LIGHT LASER TURP (TRANSURETHRAL RESECTION OF PROSTATE N/A 06/13/2015  ? Procedure: GREEN LIGHT LASER TURP (TRANSURETHRAL RESECTION OF PROSTATE;  Surgeon: [name], MD;  Location: [facility];  Service: Urology;  Laterality: N/A;  ? HERNIA REPAIR  infant  ? KNEE ARTHROSCOPY W/ MENISCECTOMY Bilateral left  10-04-2008/  right 11-09-2007  ? and chondraplasty/  synovectomy  ? TRANSURETHRAL RESECTION OF PROSTATE N/A 05/18/2019  ? Procedure: TRANSURETHRAL RESECTION OF THE PROSTATE (TURP);  Surgeon: [name], MD;  Location: [facility];  Service: Urology;  Laterality: N/A;  ? VASECTOMY  1980's  ? ? ?There were no vitals  filed for this visit. ? ? Subjective Assessment - 10/04/21 1102   ? ? Subjective Pain and weakness it he L leg   ? Currently in Pain? No/denies   ? ?  ?  ? ?  ? ? ? ? ? ? ? ? ? ? ? ? ? ? ? ? ? ? ? ? [place] Adult PT Treatment/Exercise - 10/04/21 0001   ? ?  ? Knee/Hip Exercises: Aerobic  ? Recumbent Bike L3,6 x 6 min   ?  ? Knee/Hip Exercises: Machines for Strengthening  ? Cybex Knee Extension 10lb 3x5, LLE 25lb eccentrics x10   ? Cybex Knee Flexion LLE 25lb 2x10   ? Cybex Leg Press 40lb x15 LLE 30lb 3x5   ?  ? Knee/Hip Exercises: Standing  ? Lateral Step Up Left;1 set;10 reps;Hand Hold: 1;Step Height: 6"   ? Step Down Left;2 sets;10 reps;Hand Hold: 2;Step Height: 4"   ? Walking with Sports Cord 40lb 4 way x 3 each   ?  ? Knee/Hip Exercises: Seated  ? Sit to Sand 2 sets;10 reps;without UE support   green L knee TKE  ? ?  ?  ? ?  ? ? ? ? ? ? ? ? ? ? ? ? PT Short Term Goals - 10/04/21 1141   ? ?  ? PT SHORT TERM  GOAL #1  ? Title independent with initial HEP   ? Status Achieved   ? ?  ?  ? ?  ? ? ? ? PT Long Term Goals - 10/04/21 1141   ? ?  ? PT LONG TERM GOAL #1  ? Title decrease pain with walking 50%   ? Status Achieved   ?  ? PT LONG TERM GOAL #2  ? Title increae left knee extension to 8 degrees from full extension   ? Status On-going   ?  ? PT LONG TERM GOAL #3  ? Title walkwithout device and without using furniture or walls for balance after sitting   ? Status Partially Met   ?  ? PT LONG TERM GOAL #4  ? Title independent with advanced HEP   ? Status Achieved   ? ?  ?  ? ?  ? ? ? ? ? ? ? ? Plan - 10/04/21 1142   ? ? Clinical Impression Statement Pt continues to report LLE pain and weakness. L quad is noticeably smaller than R. Continues focus on LLE strength. He's able to complete all intervention but does require some UE assist with all step up and step down interventions. SL on LEG press was taxing initially but he seemed to improve as reps progressed.   ? Stability/Clinical Decision Making Stable/Uncomplicated    ? Rehab Potential Good   ? PT Frequency 1x / week   ? PT Duration 12 weeks   ? PT Treatment/Interventions ADLs/Self Care Home Management;Electrical Stimulation;Gait training;Stair training;Functional mobility training;Therapeutic activities;Therapeutic exercise;Balance training;Neuromuscular re-education;Manual techniques;Patient/family education;Dry needling   ? PT Next Visit Plan needs more TKE, increase VMO strength, hip IR, stretch, HS and ITB   ? ?  ?  ? ?  ? ? ?Patient will benefit from skilled therapeutic intervention in order to improve the following deficits and impairments:  Abnormal gait, Decreased range of motion, Difficulty walking, Decreased activity tolerance, Pain, Decreased balance, Impaired flexibility, Decreased strength, Decreased mobility ? ?Visit Diagnosis: ?Chronic pain of left knee ? ?Difficulty in walking, not elsewhere classified ? ? ? ? ?Problem List ?Patient Active Problem List  ? Diagnosis Date Noted  ? 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 ? ?Scot Jun, PTA ?10/04/2021, 11:44 AM ? ? ?[place] ?[address] ?[place], [place], [address] ?Phone: [phone]   Fax:  [phone] ? ?Name: Nicholas Mahoney ?MRN: [id number] ?Date of Birth: 1945/11/16 ? ? ? ?

## 2021-10-10 ENCOUNTER — Other Ambulatory Visit: Payer: Self-pay | Admitting: Cardiology

## 2021-10-10 ENCOUNTER — Ambulatory Visit: Payer: Medicare Other | Attending: Orthopedic Surgery | Admitting: Physical Therapy

## 2021-10-10 ENCOUNTER — Encounter: Payer: Self-pay | Admitting: Physical Therapy

## 2021-10-10 DIAGNOSIS — R262 Difficulty in walking, not elsewhere classified: Secondary | ICD-10-CM | POA: Insufficient documentation

## 2021-10-10 DIAGNOSIS — G8929 Other chronic pain: Secondary | ICD-10-CM | POA: Insufficient documentation

## 2021-10-10 DIAGNOSIS — M25562 Pain in left knee: Secondary | ICD-10-CM | POA: Insufficient documentation

## 2021-10-10 NOTE — Therapy (Signed)
?[place] ?[address] ?[place], [place], [address] ?Phone: [phone]   Fax:  [phone] ? ?Physical Therapy Treatment ? ?Patient Details  ?Name: Nicholas Mahoney ?MRN: [id number] ?Date of Birth: [birth year] ?Referring Provider (PT): Aluisio ? ? ?Encounter Date: 10/10/2021 ? ? PT End of Session - 10/10/21 1022   ? ? Visit Number 5   ? Date for PT Re-Evaluation 12/11/21   ? Authorization Type [employer]   ? PT Start Time 0845   ? PT Stop Time 0940   ? PT Time Calculation (min) 55 min   ? Activity Tolerance Patient tolerated treatment well   ? Behavior During Therapy [facility] for tasks assessed/performed   ? ?  ?  ? ?  ? ? ?Past Medical History:  ?Diagnosis Date  ? Bladder outlet obstruction   ? BPH (benign prostatic hyperplasia)   ? ED (erectile dysfunction)   ? Hyperlipidemia   ? Lower urinary tract symptoms (LUTS)   ? Migraines   ? none recent  ? 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 no drops  ? OA (osteoarthritis) of knee   ? Vitamin D deficiency   ? Wears glasses   ? Wears hearing aid   ? bilateral  ? ? ?Past Surgical History:  ?Procedure Laterality Date  ? APPENDECTOMY  1980's  ? CARDIAC CATHETERIZATION  01-26-2008  dr Marlou Porch  ? abnormal myoview/  no sig. cad,  normal LVF,  ef 60%,  false positive myoview  ? GREEN LIGHT LASER TURP (TRANSURETHRAL RESECTION OF PROSTATE N/A 06/13/2015  ? Procedure: GREEN LIGHT LASER TURP (TRANSURETHRAL RESECTION OF PROSTATE;  Surgeon: [name], MD;  Location: [facility];  Service: Urology;  Laterality: N/A;  ? HERNIA REPAIR  infant  ? KNEE ARTHROSCOPY W/ MENISCECTOMY Bilateral left  10-04-2008/  right 11-09-2007  ? and chondraplasty/  synovectomy  ? TRANSURETHRAL RESECTION OF PROSTATE N/A 05/18/2019  ? Procedure: TRANSURETHRAL RESECTION OF THE PROSTATE (TURP);  Surgeon: [name], MD;  Location: [facility];  Service: Urology;  Laterality: N/A;  ? VASECTOMY  1980's  ? ? ?There were no vitals  filed for this visit. ? ? Subjective Assessment - 10/10/21 0850   ? ? Subjective I don't know, the leg just does not work how I think it should, what else can I do?   ? ?  ?  ? ?  ? ? ? ? ? ? ? ? ? ? ? ? ? ? ? ? ? ? ? ? [place] Adult PT Treatment/Exercise - 10/10/21 0001   ? ?  ? Ambulation/Gait  ? Gait Comments fast walking around the back building, trying to get him to do his normal pace that he says he did prior to the surgery   ?  ? Knee/Hip Exercises: Aerobic  ? Recumbent Bike L 4x 6 min   ?  ? Knee/Hip Exercises: Machines for Strengthening  ? Cybex Knee Extension 10# left only3x10 for the TKE a lot of cues   ?  ? Knee/Hip Exercises: Standing  ? Other Standing Knee Exercises hip abduction and extenison red tband   ?  ? Knee/Hip Exercises: Seated  ? Other Seated Knee/Hip Exercises ball b/n knees red tband ankles hip IR 3x10   ?  ? Knee/Hip Exercises: Supine  ? [employer] Left;3 sets;10 reps   ? Short [employer] Limitations 5#   ? Other Supine Knee/Hip Exercises LLLD 10# knee extension stretch 96mins x  3   ?  ? Knee/Hip Exercises: Sidelying  ? Clams 5# at the knee 2x10   ?  ? Knee/Hip Exercises: Prone  ? Prone Knee Hang 1 minute   ? Prone Knee Hang Weights (lbs) 1#   ? Prone Knee Hang Limitations 2x   ?  ? Manual Therapy  ? Passive ROM focus on TKE heel raised on bolster   ? ?  ?  ? ?  ? ? ? ? ? ? ? ? ? ? ? ? PT Short Term Goals - 10/04/21 1141   ? ?  ? PT SHORT TERM GOAL #1  ? Title independent with initial HEP   ? Status Achieved   ? ?  ?  ? ?  ? ? ? ? PT Long Term Goals - 10/10/21 1024   ? ?  ? PT LONG TERM GOAL #1  ? Title decrease pain with walking 50%   ? Status Achieved   ?  ? PT LONG TERM GOAL #2  ? Title increae left knee extension to 8 degrees from full extension   ? Status On-going   ?  ? PT LONG TERM GOAL #3  ? Title walkwithout device and without using furniture or walls for balance after sitting   ? Status Partially Met   ? ?  ?  ? ?  ? ? ? ? ? ? ? ? Plan - 10/10/21 1022   ? ? Clinical  Impression Statement Focused todays treatment on TKE and really needing cues for slow motions and to get good end range contraction.  We also focused on the hip strength, added more PROM to get the end range extension.  This is painful but he tolerated well.  When walking it appears to hvae left hip weakness but could be due to the lack of knee extension that casues the pelvis to drop, this smoothed out some with faster walking   ? PT Next Visit Plan continue to work on TKE, hip strength and passive extension   ? Consulted and Agree with Plan of Care Patient   ? ?  ?  ? ?  ? ? ?Patient will benefit from skilled therapeutic intervention in order to improve the following deficits and impairments:  Abnormal gait, Decreased range of motion, Difficulty walking, Decreased activity tolerance, Pain, Decreased balance, Impaired flexibility, Decreased strength, Decreased mobility ? ?Visit Diagnosis: ?Chronic pain of left knee ? ?Difficulty in walking, not elsewhere classified ? ? ? ? ?Problem List ?Patient Active Problem List  ? Diagnosis Date Noted  ? 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 ? Sumner Boast, PT ?10/10/2021, 10:25 AM ? ?[place] ?[address] ?[address] ?[place], [place], [address] ?Phone: [phone]   Fax:  [phone] ? ?Name: Nicholas Mahoney ?MRN: [id number] ?Date of Birth: 1945-08-16 ? ? ? ?

## 2021-10-15 ENCOUNTER — Encounter: Payer: Self-pay | Admitting: Physical Therapy

## 2021-10-15 ENCOUNTER — Ambulatory Visit: Payer: Medicare Other | Admitting: Physical Therapy

## 2021-10-15 DIAGNOSIS — G8929 Other chronic pain: Secondary | ICD-10-CM

## 2021-10-15 DIAGNOSIS — M25562 Pain in left knee: Secondary | ICD-10-CM | POA: Diagnosis not present

## 2021-10-15 DIAGNOSIS — R262 Difficulty in walking, not elsewhere classified: Secondary | ICD-10-CM

## 2021-10-15 NOTE — Therapy (Signed)
[place] ?[place] [address] ?[place], [place], [address] ?Phone: [phone]   Fax:  [phone] ? ?Physical Therapy Treatment ? ?Patient Details  ?Name: Nicholas Mahoney ?MRN: [id number] ?Date of Birth: [birth year] ?Referring Provider (PT): Aluisio ? ? ?Encounter Date: 10/15/2021 ? ? PT End of Session - 10/15/21 3810   ? ? Visit Number 6   ? Date for PT Re-Evaluation 12/11/21   ? Authorization Type Mcare   ? PT Start Time 0845   ? PT Stop Time 0930   ? PT Time Calculation (min) 45 min   ? Activity Tolerance Patient tolerated treatment well   ? Behavior During Therapy [facility] for tasks assessed/performed   ? ?  ?  ? ?  ? ? ?Past Medical History:  ?Diagnosis Date  ? Bladder outlet obstruction   ? BPH (benign prostatic hyperplasia)   ? ED (erectile dysfunction)   ? Hyperlipidemia   ? Lower urinary tract symptoms (LUTS)   ? Migraines   ? none recent  ? 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 no drops  ? OA (osteoarthritis) of knee   ? Vitamin D deficiency   ? Wears glasses   ? Wears hearing aid   ? bilateral  ? ? ?Past Surgical History:  ?Procedure Laterality Date  ? APPENDECTOMY  1980's  ? CARDIAC CATHETERIZATION  01-26-2008  dr Marlou Porch  ? abnormal myoview/  no sig. cad,  normal LVF,  ef 60%,  false positive myoview  ? GREEN LIGHT LASER TURP (TRANSURETHRAL RESECTION OF PROSTATE N/A 06/13/2015  ? Procedure: GREEN LIGHT LASER TURP (TRANSURETHRAL RESECTION OF PROSTATE;  Surgeon: [name], MD;  Location: [facility];  Service: Urology;  Laterality: N/A;  ? HERNIA REPAIR  infant  ? KNEE ARTHROSCOPY W/ MENISCECTOMY Bilateral left  10-04-2008/  right 11-09-2007  ? and chondraplasty/  synovectomy  ? TRANSURETHRAL RESECTION OF PROSTATE N/A 05/18/2019  ? Procedure: TRANSURETHRAL RESECTION OF THE PROSTATE (TURP);  Surgeon: [name], MD;  Location: [facility];  Service: Urology;  Laterality: N/A;  ? VASECTOMY  1980's  ? ? ?There were no vitals  filed for this visit. ? ? Subjective Assessment - 10/15/21 0849   ? ? Subjective I have been doing the hip strength exercises, I do have hip pain and knee pain which is usual   ? Currently in Pain? Yes   ? Pain Score 2    ? Pain Location Hip   ? Pain Orientation Left   ? Pain Descriptors / Indicators Aching;Sore;Tightness   ? ?  ?  ? ?  ? ? ? ? ? [facility] PT Assessment - 10/15/21 0001   ? ?  ? AROM  ? Left Knee Extension 10   ?  ? Ambulation/Gait  ? Gait Comments gait fast walk around the building, started limping and c/o some left buttock pain with goin gup hill today   ? ?  ?  ? ?  ? ? ? ? ? ? ? ? ? ? ? ? ? ? ? ? [place] Adult PT Treatment/Exercise - 10/15/21 0001   ? ?  ? Knee/Hip Exercises: Stretches  ? ITB Stretch Left;3 reps;20 seconds   ? Piriformis Stretch Left;4 reps;20 seconds   ?  ? Knee/Hip Exercises: Aerobic  ? Recumbent Bike L 4x 6 min   ?  ? Knee/Hip Exercises: Machines for Strengthening  ? Cybex Knee Extension 5# left only, really focus on the TKE, decresaed wegiht today  to get better end range   ? Cybex Leg Press left leg only 20#   ?  ? Knee/Hip Exercises: Seated  ? Other Seated Knee/Hip Exercises ball b/n knees red tband ankles hip IR 3x10   ?  ? Manual Therapy  ? Passive ROM focus on TKE heel raised on bolster   ? ?  ?  ? ?  ? ? ? ? ? ? ? ? ? ? ? ? PT Short Term Goals - 10/04/21 1141   ? ?  ? PT SHORT TERM GOAL #1  ? Title independent with initial HEP   ? Status Achieved   ? ?  ?  ? ?  ? ? ? ? PT Long Term Goals - 10/10/21 1024   ? ?  ? PT LONG TERM GOAL #1  ? Title decrease pain with walking 50%   ? Status Achieved   ?  ? PT LONG TERM GOAL #2  ? Title increae left knee extension to 8 degrees from full extension   ? Status On-going   ?  ? PT LONG TERM GOAL #3  ? Title walkwithout device and without using furniture or walls for balance after sitting   ? Status Partially Met   ? ?  ?  ? ?  ? ? ? ? ? ? ? ? Plan - 10/15/21 0928   ? ? Clinical Impression Statement I continued to focus on the TKE, he is very  tight, he did gain 8 degrees of extension from the eval 5 weeks ago, he did have pain in the left hip with piriformis stretches.  After the stretches he limped quite a bit for the first 5-10 steps.  He has a big trip planned at the end of this month where he may have to do a lot of walking.   ? PT Next Visit Plan continues to focus on the TKE   ? Consulted and Agree with Plan of Care Patient   ? ?  ?  ? ?  ? ? ?Patient will benefit from skilled therapeutic intervention in order to improve the following deficits and impairments:  Abnormal gait, Decreased range of motion, Difficulty walking, Decreased activity tolerance, Pain, Decreased balance, Impaired flexibility, Decreased strength, Decreased mobility ? ?Visit Diagnosis: ?Chronic pain of left knee ? ?Difficulty in walking, not elsewhere classified ? ? ? ? ?Problem List ?Patient Active Problem List  ? Diagnosis Date Noted  ? 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 ? Sumner Boast, PT ?10/15/2021, 10:10 AM ? ?[place] ?[place] ?[address] ?[place], [place], [address] ?Phone: [phone]   Fax:  [phone] ? ?Name: Nicholas Mahoney ?MRN: [id number] ?Date of Birth: 1946-03-03 ? ? ? ?

## 2021-10-17 ENCOUNTER — Encounter: Payer: Self-pay | Admitting: Physical Therapy

## 2021-10-17 ENCOUNTER — Ambulatory Visit: Payer: Medicare Other | Admitting: Physical Therapy

## 2021-10-17 DIAGNOSIS — M25562 Pain in left knee: Secondary | ICD-10-CM | POA: Diagnosis not present

## 2021-10-17 DIAGNOSIS — G8929 Other chronic pain: Secondary | ICD-10-CM | POA: Diagnosis not present

## 2021-10-17 DIAGNOSIS — R262 Difficulty in walking, not elsewhere classified: Secondary | ICD-10-CM | POA: Diagnosis not present

## 2021-10-17 NOTE — Therapy (Signed)
[place] ?[place] ?[address] ?[place], [place], [address] ?Phone: [phone]   Fax:  [phone] ? ?Physical Therapy Treatment ? ?Patient Details  ?Name: Nicholas Mahoney ?MRN: [id number] ?Date of Birth: 09/12/1945 ?Referring Provider (PT): Aluisio ? ? ?Encounter Date: 10/17/2021 ? ? PT End of Session - 10/17/21 0848   ? ? Visit Number 7   ? Date for PT Re-Evaluation 12/11/21   ? Authorization Type Mcare   ? PT Start Time 0800   ? PT Stop Time 0845   ? PT Time Calculation (min) 45 min   ? Activity Tolerance Patient tolerated treatment well   ? Behavior During Therapy [facility] for tasks assessed/performed   ? ?  ?  ? ?  ? ? ?Past Medical History:  ?Diagnosis Date  ? Bladder outlet obstruction   ? BPH (benign prostatic hyperplasia)   ? ED (erectile dysfunction)   ? Hyperlipidemia   ? Lower urinary tract symptoms (LUTS)   ? Migraines   ? none recent  ? 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 no drops  ? OA (osteoarthritis) of knee   ? Vitamin D deficiency   ? Wears glasses   ? Wears hearing aid   ? bilateral  ? ? ?Past Surgical History:  ?Procedure Laterality Date  ? APPENDECTOMY  1980's  ? CARDIAC CATHETERIZATION  01-26-2008  dr Marlou Porch  ? abnormal myoview/  no sig. cad,  normal LVF,  ef 60%,  false positive myoview  ? GREEN LIGHT LASER TURP (TRANSURETHRAL RESECTION OF PROSTATE N/A 06/13/2015  ? Procedure: GREEN LIGHT LASER TURP (TRANSURETHRAL RESECTION OF PROSTATE;  Surgeon: [name], MD;  Location: [facility];  Service: Urology;  Laterality: N/A;  ? HERNIA REPAIR  infant  ? KNEE ARTHROSCOPY W/ MENISCECTOMY Bilateral left  10-04-2008/  right 11-09-2007  ? and chondraplasty/  synovectomy  ? TRANSURETHRAL RESECTION OF PROSTATE N/A 05/18/2019  ? Procedure: TRANSURETHRAL RESECTION OF THE PROSTATE (TURP);  Surgeon: [name], MD;  Location: [facility];  Service: Urology;  Laterality: N/A;  ? VASECTOMY  1980's  ? ? ?There were no vitals  filed for this visit. ? ? Subjective Assessment - 10/17/21 0759   ? ? Subjective Doing well, Patient reports a lot of popping in the left knee with sitting and dangling legs and swinging them , I think it is a tendon vs scar tissue rolling over a bony prominence, he says the MD in 2 weeks   ? Currently in Pain? No/denies   ? ?  ?  ? ?  ? ? ? ? ? ? ? ? ? ? ? ? ? ? ? ? ? ? ? ? [place] Adult PT Treatment/Exercise - 10/17/21 0001   ? ?  ? High Level Balance  ? High Level Balance Comments side step on and off airex, on airex ball toss, head turns, eyes closed, on the large mat figure 8's, walking ball toss   ?  ? Knee/Hip Exercises: Stretches  ? Gastroc Stretch Both;3 reps;20 seconds   ?  ? Knee/Hip Exercises: Aerobic  ? Nustep Level 6 x 4 minutes   ?  ? Knee/Hip Exercises: Machines for Strengthening  ? Cybex Leg Press left leg only 20#   ?  ? Manual Therapy  ? Manual therapy comments passive piriformis stretch, STM to the superior patella area   ? Passive ROM focus on TKE heel raised on bolster   ? ?  ?  ? ?  ? ? ? ? ? ? ? ? ? ? ? ?  PT Short Term Goals - 10/04/21 1141   ? ?  ? PT SHORT TERM GOAL #1  ? Title independent with initial HEP   ? Status Achieved   ? ?  ?  ? ?  ? ? ? ? PT Long Term Goals - 10/17/21 0850   ? ?  ? PT LONG TERM GOAL #1  ? Title decrease pain with walking 50%   ? Status Achieved   ?  ? PT LONG TERM GOAL #2  ? Title increae left knee extension to 8 degrees from full extension   ? Status On-going   ?  ? PT LONG TERM GOAL #3  ? Title walkwithout device and without using furniture or walls for balance after sitting   ? Status Partially Met   ? ?  ?  ? ?  ? ? ? ? ? ? ? ? Plan - 10/17/21 0848   ? ? Clinical Impression Statement Focused today a little more on his balance as he tends to be back on his heels, he has the reactions and I never felt like I had to catch him but he did lose balance and had to take a step to correct.  I also added some STM to the superior patella.  He will be taking a vacation in  the near futue so I want to make sure he is safe   ? PT Next Visit Plan continues to focus on the TKE and balance   ? Consulted and Agree with Plan of Care Patient   ? ?  ?  ? ?  ? ? ?Patient will benefit from skilled therapeutic intervention in order to improve the following deficits and impairments:  Abnormal gait, Decreased range of motion, Difficulty walking, Decreased activity tolerance, Pain, Decreased balance, Impaired flexibility, Decreased strength, Decreased mobility ? ?Visit Diagnosis: ?Chronic pain of left knee ? ?Difficulty in walking, not elsewhere classified ? ? ? ? ?Problem List ?Patient Active Problem List  ? Diagnosis Date Noted  ? 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 ? Sumner Boast, PT ?10/17/2021, 8:51 AM ? ?[place] ?[address] ?[address] ?[place], [place], [address] ?Phone: [phone]   Fax:  [phone] ? ?Name: Nicholas Mahoney ?MRN: [id number] ?Date of Birth: [birth year] ? ? ? ?

## 2021-10-24 ENCOUNTER — Ambulatory Visit: Payer: Medicare Other | Admitting: Physical Therapy

## 2021-10-24 ENCOUNTER — Encounter: Payer: Self-pay | Admitting: Physical Therapy

## 2021-10-24 DIAGNOSIS — R262 Difficulty in walking, not elsewhere classified: Secondary | ICD-10-CM

## 2021-10-24 DIAGNOSIS — G8929 Other chronic pain: Secondary | ICD-10-CM

## 2021-10-24 DIAGNOSIS — M25562 Pain in left knee: Secondary | ICD-10-CM | POA: Diagnosis not present

## 2021-10-24 NOTE — Therapy (Signed)
[place] ?[facility] ?[address] ?[place], [place], [address] ?Phone: [phone]   Fax:  [phone] ? ?Physical Therapy Treatment ? ?Patient Details  ?Name: Nicholas Mahoney ?MRN: [id number] ?Date of Birth: 09/19/1945 ?Referring Provider (PT): Aluisio ? ? ?Encounter Date: 10/24/2021 ? ? PT End of Session - 10/24/21 1117   ? ? Visit Number 8   ? Date for PT Re-Evaluation 12/11/21   ? Authorization Type Mcare   ? PT Start Time 1015   ? PT Stop Time 1057   ? PT Time Calculation (min) 42 min   ? Activity Tolerance Patient tolerated treatment well   ? Behavior During Therapy [facility] for tasks assessed/performed   ? ?  ?  ? ?  ? ? ?Past Medical History:  ?Diagnosis Date  ? Bladder outlet obstruction   ? BPH (benign prostatic hyperplasia)   ? ED (erectile dysfunction)   ? Hyperlipidemia   ? Lower urinary tract symptoms (LUTS)   ? Migraines   ? none recent  ? 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 no drops  ? OA (osteoarthritis) of knee   ? Vitamin D deficiency   ? Wears glasses   ? Wears hearing aid   ? bilateral  ? ? ?Past Surgical History:  ?Procedure Laterality Date  ? APPENDECTOMY  1980's  ? CARDIAC CATHETERIZATION  01-26-2008  dr skains  ? abnormal myoview/  no sig. cad,  normal LVF,  ef 60%,  false positive myoview  ? GREEN LIGHT LASER TURP (TRANSURETHRAL RESECTION OF PROSTATE N/A 06/13/2015  ? Procedure: GREEN LIGHT LASER TURP (TRANSURETHRAL RESECTION OF PROSTATE;  Surgeon: Matthew Eskridge, MD;  Location: [facility];  Service: Urology;  Laterality: N/A;  ? HERNIA REPAIR  infant  ? KNEE ARTHROSCOPY W/ MENISCECTOMY Bilateral left  10-04-2008/  right 11-09-2007  ? and chondraplasty/  synovectomy  ? TRANSURETHRAL RESECTION OF PROSTATE N/A 05/18/2019  ? Procedure: TRANSURETHRAL RESECTION OF THE PROSTATE (TURP);  Surgeon: Eskridge, Matthew, MD;  Location: [facility];  Service: Urology;  Laterality: N/A;  ? VASECTOMY  1980's  ? ? ?There were no vitals  filed for this visit. ? ? Subjective Assessment - 10/24/21 1018   ? ? Subjective About the same, not too sore after the treatments, sometimes pain in the knee, thigh and or hip   ? Currently in Pain? No/denies   ? ?  ?  ? ?  ? ? ? ? ? ? ? ? ? ? ? ? ? ? ? ? ? ? ? ? [facility] Adult PT Treatment/Exercise - 10/24/21 0001   ? ?  ? Ambulation/Gait  ? Gait Comments gait fast walk around the building, started limping and c/o some left buttock pain with going up hill today also did the full set of stairs 2x   ?  ? High Level Balance  ? High Level Balance Comments side step on and off airex, on airex ball toss, head turns, eyes closed, airex balance beam tandem and side stepping   ?  ? Knee/Hip Exercises: Machines for Strengthening  ? Cybex Knee Extension 5# left only, really focus on the TKE, decresaed weight today to get better end range   ? Cybex Leg Press left leg only 20# 2 x10   ?  ? Manual Therapy  ? Passive ROM focus on TKE heel raised on bolster   ? ?  ?  ? ?  ? ? ? ? ? ? ? ? ? ? ? ?   PT Short Term Goals - 10/04/21 1141   ? ?  ? PT SHORT TERM GOAL #1  ? Title independent with initial HEP   ? Status Achieved   ? ?  ?  ? ?  ? ? ? ? PT Long Term Goals - 10/24/21 1118   ? ?  ? PT LONG TERM GOAL #1  ? Title decrease pain with walking 50%   ? Status Achieved   ?  ? PT LONG TERM GOAL #2  ? Title increae left knee extension to 8 degrees from full extension   ? Status On-going   ?  ? PT LONG TERM GOAL #3  ? Title walkwithout device and without using furniture or walls for balance after sitting   ? Status Partially Met   ? ?  ?  ? ?  ? ? ? ? ? ? ? ? Plan - 10/24/21 1117   ? ? Clinical Impression Statement worked on stairs, stemina and balance as well as pushed some ROM, still very tight and hurts with trying to get TKE, he does well with the balance if he has something to hold onto or at least seethat he can grab   ? PT Next Visit Plan continues to focus on the TKE and balance   ? Consulted and Agree with Plan of Care Patient   ? ?   ?  ? ?  ? ? ?Patient will benefit from skilled therapeutic intervention in order to improve the following deficits and impairments:  Abnormal gait, Decreased range of motion, Difficulty walking, Decreased activity tolerance, Pain, Decreased balance, Impaired flexibility, Decreased strength, Decreased mobility ? ?Visit Diagnosis: ?Chronic pain of left knee ? ?Difficulty in walking, not elsewhere classified ? ? ? ? ?Problem List ?Patient Active Problem List  ? Diagnosis Date Noted  ? 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 ? Sumner Boast, PT ?10/24/2021, 11:19 AM ? ?[place] ?[place] ?[address] ?[place], [place], [address] ?Phone: [phone]   Fax:  [phone] ? ?Name: Nicholas Mahoney ?MRN: [id number] ?Date of Birth: January 29, 1946 ? ? ? ?

## 2021-10-27 DIAGNOSIS — Z23 Encounter for immunization: Secondary | ICD-10-CM | POA: Diagnosis not present

## 2021-10-28 ENCOUNTER — Other Ambulatory Visit: Payer: Self-pay | Admitting: Cardiology

## 2021-10-31 ENCOUNTER — Ambulatory Visit: Payer: Medicare Other | Admitting: Physical Therapy

## 2021-10-31 ENCOUNTER — Encounter: Payer: Self-pay | Admitting: Physical Therapy

## 2021-10-31 DIAGNOSIS — R262 Difficulty in walking, not elsewhere classified: Secondary | ICD-10-CM | POA: Diagnosis not present

## 2021-10-31 DIAGNOSIS — G8929 Other chronic pain: Secondary | ICD-10-CM | POA: Diagnosis not present

## 2021-10-31 DIAGNOSIS — M25562 Pain in left knee: Secondary | ICD-10-CM | POA: Diagnosis not present

## 2021-10-31 NOTE — Therapy (Signed)
[place] ?[place] [address] ?[place], [place], [address] ?Phone: [phone]   Fax:  [phone] ? ?Physical Therapy Treatment ? ?Patient Details  ?Name: Nicholas Mahoney ?MRN: [id number] ?Date of Birth: 12/01/1945 ?Referring Provider (PT): Aluisio ? ? ?Encounter Date: 10/31/2021 ? ? PT End of Session - 10/31/21 1833   ? ? Visit Number 9   ? Date for PT Re-Evaluation 12/11/21   ? Authorization Type Mcare   ? PT Start Time 1700   ? PT Stop Time 0300   ? PT Time Calculation (min) 45 min   ? Activity Tolerance Patient tolerated treatment well   ? Behavior During Therapy [facility] for tasks assessed/performed   ? ?  ?  ? ?  ? ? ?Past Medical History:  ?Diagnosis Date  ? Bladder outlet obstruction   ? BPH (benign prostatic hyperplasia)   ? ED (erectile dysfunction)   ? Hyperlipidemia   ? Lower urinary tract symptoms (LUTS)   ? Migraines   ? none recent  ? 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 no drops  ? OA (osteoarthritis) of knee   ? Vitamin D deficiency   ? Wears glasses   ? Wears hearing aid   ? bilateral  ? ? ?Past Surgical History:  ?Procedure Laterality Date  ? APPENDECTOMY  1980's  ? CARDIAC CATHETERIZATION  01-26-2008  dr Marlou Porch  ? abnormal myoview/  no sig. cad,  normal LVF,  ef 60%,  false positive myoview  ? GREEN LIGHT LASER TURP (TRANSURETHRAL RESECTION OF PROSTATE N/A 06/13/2015  ? Procedure: GREEN LIGHT LASER TURP (TRANSURETHRAL RESECTION OF PROSTATE;  Surgeon: [name], MD;  Location: [facility];  Service: Urology;  Laterality: N/A;  ? HERNIA REPAIR  infant  ? KNEE ARTHROSCOPY W/ MENISCECTOMY Bilateral left  10-04-2008/  right 11-09-2007  ? and chondraplasty/  synovectomy  ? TRANSURETHRAL RESECTION OF PROSTATE N/A 05/18/2019  ? Procedure: TRANSURETHRAL RESECTION OF THE PROSTATE (TURP);  Surgeon: [name], MD;  Location: [facility];  Service: Urology;  Laterality: N/A;  ? VASECTOMY  1980's  ? ? ?There were no vitals  filed for this visit. ? ? Subjective Assessment - 10/31/21 1731   ? ? Subjective Patient will see surgeon tomorrow about the crepitus, he is leaving on a 4 week trip.   ? Currently in Pain? No/denies   ? ?  ?  ? ?  ? ? ? ? ? ? ? ? ? ? ? ? ? ? ? ? ? ? ? ? [place] Adult PT Treatment/Exercise - 10/31/21 0001   ? ?  ? Ambulation/Gait  ? Gait Comments fast walking around the back building, resisted gait fwd and bkwd outside   ?  ? Knee/Hip Exercises: Machines for Strengthening  ? Cybex Knee Extension 5# left only, really focus on the TKE, decresaed weight today to get better end range   ? Cybex Leg Press left only 20# 3x10 wiht tband behind the knee to help with TKE   ?  ? Manual Therapy  ? Manual therapy comments STM around the superior patella   ? Passive ROM focus on TKE heel raised on bolster   ? ?  ?  ? ?  ? ? ? ? ? ? ? ? ? ? ? ? PT Short Term Goals - 10/04/21 1141   ? ?  ? PT SHORT TERM GOAL #1  ? Title independent with initial HEP   ? Status Achieved   ? ?  ?  ? ?  ? ? ? ?  PT Long Term Goals - 10/31/21 1836   ? ?  ? PT LONG TERM GOAL #1  ? Title decrease pain with walking 50%   ? Status Achieved   ?  ? PT LONG TERM GOAL #2  ? Title increae left knee extension to 8 degrees from full extension   ? Status Partially Met   ?  ? PT LONG TERM GOAL #3  ? Title walkwithout device and without using furniture or walls for balance after sitting   ? Status Partially Met   ?  ? PT LONG TERM GOAL #4  ? Title independent with advanced HEP   ? Status Achieved   ? ?  ?  ? ?  ? ? ? ? ? ? ? ? Plan - 10/31/21 1833   ? ? Clinical Impression Statement I focused a lot on the knee extension, I can get him passively to 5 degrees from full extnesion, this is very painful though.  I did send a note with him to the surgeon for tomorrow asking about dynaspint vs Jas brace to see if this would gain ROM into extension and would it be worth it.  I feel like there is more flexibility now and that this is improving from being so rigid when we started    ? PT Next Visit Plan he will be away for about a month on a big trip, he sees the MD tomorrow, see what is said about the dynamic splint/brace   ? Consulted and Agree with Plan of Care Patient   ? ?  ?  ? ?  ? ? ?Patient will benefit from skilled therapeutic intervention in order to improve the following deficits and impairments:  Abnormal gait, Decreased range of motion, Difficulty walking, Decreased activity tolerance, Pain, Decreased balance, Impaired flexibility, Decreased strength, Decreased mobility ? ?Visit Diagnosis: ?Chronic pain of left knee ? ?Difficulty in walking, not elsewhere classified ? ? ? ? ?Problem List ?Patient Active Problem List  ? Diagnosis Date Noted  ? 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 ? Sumner Boast, PT ?10/31/2021, 6:37 PM ? ?[place] ?[place] ?[address] ?[place], [place], [address] ?Phone: [phone]   Fax:  [phone] ? ?Name: Nicholas Mahoney ?MRN: [id number] ?Date of Birth: [birth year] ? ? ? ?

## 2021-11-01 DIAGNOSIS — Z96652 Presence of left artificial knee joint: Secondary | ICD-10-CM | POA: Diagnosis not present

## 2021-11-30 ENCOUNTER — Other Ambulatory Visit: Payer: Self-pay | Admitting: Cardiology

## 2021-12-05 ENCOUNTER — Telehealth: Payer: Self-pay | Admitting: Cardiology

## 2021-12-05 NOTE — Telephone Encounter (Signed)
*  STAT* If patient is at the pharmacy, call can be transferred to refill team.   1. Which medications need to be refilled? (please list name of each medication and dose if known) rosuvastatin (CRESTOR) 20 MG tablet  2. Which pharmacy/location (including street and city if local pharmacy) is medication to be sent to? [facility] [place], [place] - [address]  3. Do they need a 30 day or 90 day supply? 90  Pt scheduled appt with Dr Marlou Porch for 08/01 at 4:20 pm and is now requesting more of this medication called in for him.

## 2021-12-06 MED ORDER — ROSUVASTATIN CALCIUM 20 MG PO TABS: 20.0000 mg | ORAL_TABLET | Freq: Every day | ORAL | 1 refills | Status: DC

## 2021-12-06 NOTE — Telephone Encounter (Signed)
Pt scheduled to see Dr. Marlou Porch 02/05/22, sent in 30 day refill X's 1 to get pt to appointment.

## 2021-12-10 ENCOUNTER — Ambulatory Visit: Payer: Medicare Other | Attending: Orthopedic Surgery | Admitting: Physical Therapy

## 2021-12-10 ENCOUNTER — Encounter: Payer: Self-pay | Admitting: Physical Therapy

## 2021-12-10 DIAGNOSIS — G8929 Other chronic pain: Secondary | ICD-10-CM | POA: Insufficient documentation

## 2021-12-10 DIAGNOSIS — M25562 Pain in left knee: Secondary | ICD-10-CM | POA: Diagnosis not present

## 2021-12-10 DIAGNOSIS — R262 Difficulty in walking, not elsewhere classified: Secondary | ICD-10-CM | POA: Insufficient documentation

## 2021-12-10 NOTE — Therapy (Signed)
[place]. [place], [place], [address] Phone: [phone]   Fax:  [phone]  Physical Therapy Treatment  Patient Details  Name: Nicholas Mahoney MRN: [id number] Date of Birth: 08-08-1945 Referring Provider (PT): Aluisio   Encounter Date: 12/10/2021   PT End of Session - 12/10/21 1658     Visit Number 10    Date for PT Re-Evaluation 12/20/21    Authorization Type [employer]    PT Start Time 1656    PT Stop Time 0962    PT Time Calculation (min) 49 min    Activity Tolerance Patient tolerated treatment well    Behavior During Therapy WFL for tasks assessed/performed             Past Medical History:  Diagnosis Date   Bladder outlet obstruction    BPH (benign prostatic hyperplasia)    ED (erectile dysfunction)    Hyperlipidemia    Lower urinary tract symptoms (LUTS)    Migraines    none recent   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 cad,  normal LVF,  ef 60%,  false positive myoview   GREEN LIGHT LASER TURP (TRANSURETHRAL RESECTION OF PROSTATE N/A 06/13/2015   Procedure: GREEN LIGHT LASER TURP (TRANSURETHRAL RESECTION OF PROSTATE;  Surgeon: [name], MD;  Location: [facility];  Service: Urology;  Laterality: N/A;   HERNIA REPAIR  infant   KNEE ARTHROSCOPY W/ MENISCECTOMY Bilateral left  10-04-2008/  right 11-09-2007   and chondraplasty/  synovectomy   TRANSURETHRAL RESECTION OF PROSTATE N/A 05/18/2019   Procedure: TRANSURETHRAL RESECTION OF THE PROSTATE (TURP);  Surgeon: [name], MD;  Location: [facility];  Service: Urology;  Laterality: N/A;   VASECTOMY  1980's    There were no vitals  filed for this visit.   Subjective Assessment - 12/10/21 1659     Subjective I went to [place] and I did very well, better than I thought and I was happy with how I did.  I did get an order from Dr. Binnie Rail for a JAS brace    Currently in Pain? No/denies                               [facility] Adult PT Treatment/Exercise - 12/10/21 0001       Self-Care   Self-Care Other Self-Care Comments    Other Self-Care Comments  measured and started the process for JAS brace      Manual Therapy   Passive ROM focus on TKE heel raised on bolster                     PT Education - 12/10/21 1752     Education Details gave advanced HEP for balance    Person(s) Educated Patient    Methods Explanation;Demonstration;Tactile cues;Verbal cues;Handout    Comprehension Verbalized understanding;Returned demonstration              PT Short Term Goals - 10/04/21 1141       PT SHORT TERM GOAL #1   Title independent  with initial HEP    Status Achieved               PT Long Term Goals - 12/10/21 1751       PT LONG TERM GOAL #1   Title decrease pain with walking 50%    Status Achieved      PT LONG TERM GOAL #2   Title increae left knee extension to 8 degrees from full extension    Status Partially Met      PT LONG TERM GOAL #3   Title walkwithout device and without using furniture or walls for balance after sitting    Status Partially Met      PT LONG TERM GOAL #4   Title independent with advanced HEP    Status Achieved                   Plan - 12/10/21 1753     Clinical Impression Statement Patient was out of the country for the past month.  He was very pleased at his ability.  He did prior to leaving get an order from Dr. Binnie Rail for a JAS brace, I measured him for this and will initiate the process.  I added to his HEP with balance and did some PROM into extension.    PT Treatment/Interventions ADLs/Self Care Home Management;Electrical  Stimulation;Gait training;Stair training;Functional mobility training;Therapeutic activities;Therapeutic exercise;Balance training;Neuromuscular re-education;Manual techniques;Patient/family education;Dry needling    PT Next Visit Plan will try to get the JAS moving as he will travel to [place] in 10 days and be gone for 5 weeks    Consulted and Agree with Plan of Care Patient             Patient will benefit from skilled therapeutic intervention in order to improve the following deficits and impairments:  Abnormal gait, Decreased range of motion, Difficulty walking, Decreased activity tolerance, Pain, Decreased balance, Impaired flexibility, Decreased strength, Decreased mobility  Visit Diagnosis: Chronic pain of left knee  Difficulty in walking, not elsewhere classified     Problem List Patient Active Problem List   Diagnosis Date Noted   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 [place], [place], [address] Phone: [phone]   Fax:  [phone]  Name: Nicholas Mahoney MRN: [id number] Date of Birth: [birth year]

## 2021-12-12 ENCOUNTER — Other Ambulatory Visit: Payer: Self-pay | Admitting: Cardiology

## 2021-12-18 ENCOUNTER — Encounter: Payer: Self-pay | Admitting: Physical Therapy

## 2021-12-18 ENCOUNTER — Ambulatory Visit: Payer: Medicare Other | Admitting: Physical Therapy

## 2021-12-18 DIAGNOSIS — G8929 Other chronic pain: Secondary | ICD-10-CM | POA: Diagnosis not present

## 2021-12-18 DIAGNOSIS — R262 Difficulty in walking, not elsewhere classified: Secondary | ICD-10-CM | POA: Diagnosis not present

## 2021-12-18 DIAGNOSIS — M25562 Pain in left knee: Secondary | ICD-10-CM | POA: Diagnosis not present

## 2021-12-18 NOTE — Therapy (Signed)
[place]. [place], [place], [address] Phone: [phone]   Fax:  [phone]  Physical Therapy Treatment  Patient Details  Name: Nicholas Mahoney MRN: [id number] Date of Birth: [birth year] Referring Provider (PT): Aluisio   Encounter Date: 12/18/2021   PT End of Session - 12/18/21 0845     Visit Number 11    Date for PT Re-Evaluation 12/20/21    Authorization Type [employer]    PT Start Time 0843    PT Stop Time 0926    PT Time Calculation (min) 43 min    Activity Tolerance Patient tolerated treatment well    Behavior During Therapy WFL for tasks assessed/performed             Past Medical History:  Diagnosis Date   Bladder outlet obstruction    BPH (benign prostatic hyperplasia)    ED (erectile dysfunction)    Hyperlipidemia    Lower urinary tract symptoms (LUTS)    Migraines    none recent   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 cad,  normal LVF,  ef 60%,  false positive myoview   GREEN LIGHT LASER TURP (TRANSURETHRAL RESECTION OF PROSTATE N/A 06/13/2015   Procedure: GREEN LIGHT LASER TURP (TRANSURETHRAL RESECTION OF PROSTATE;  Surgeon: [name], MD;  Location: [facility];  Service: Urology;  Laterality: N/A;   HERNIA REPAIR  infant   KNEE ARTHROSCOPY W/ MENISCECTOMY Bilateral left  10-04-2008/  right 11-09-2007   and chondraplasty/  synovectomy   TRANSURETHRAL RESECTION OF PROSTATE N/A 05/18/2019   Procedure: TRANSURETHRAL RESECTION OF THE PROSTATE (TURP);  Surgeon: [name], MD;  Location: [facility];  Service: Urology;  Laterality: N/A;   VASECTOMY  1980's    There were no vitals  filed for this visit.   Subjective Assessment - 12/18/21 0846     Subjective I was back in [place] over the weekend.  I am very stiff    Currently in Pain? No/denies                               [facility] Adult PT Treatment/Exercise - 12/18/21 0001       Ambulation/Gait   Gait Comments walking outside around in the grass and uneven terrain      Knee/Hip Exercises: Aerobic   Nustep Level 6 x 6 minutes      Knee/Hip Exercises: Machines for Strengthening   Cybex Knee Extension 5# left only, really focus on the TKE, decresaed weight today to get better end range    Cybex Knee Flexion LLE 25lb 2x15    Cybex Leg Press left only 20# 3x10 wiht tband behind the knee to help with TKE      Manual Therapy   Passive ROM focus on TKE heel raised on bolster                       PT Short Term Goals - 10/04/21 1141       PT SHORT TERM GOAL #1   Title independent with  initial HEP    Status Achieved               PT Long Term Goals - 12/10/21 1751       PT LONG TERM GOAL #1   Title decrease pain with walking 50%    Status Achieved      PT LONG TERM GOAL #2   Title increae left knee extension to 8 degrees from full extension    Status Partially Met      PT LONG TERM GOAL #3   Title walkwithout device and without using furniture or walls for balance after sitting    Status Partially Met      PT LONG TERM GOAL #4   Title independent with advanced HEP    Status Achieved                   Plan - 12/18/21 0956     Clinical Impression Statement Pateint took another trip a lot of sitting, he is stiff.  I have sent all the information in to [facility], they asked for additional information, I sent this in as well yesterday, one issue is he will be leaving town this weekend for 5 weeks, I recommended that he contact the company and ask them to contact him prior to shipping brace as he may not be in his home address.    PT Next Visit Plan finalize  our treatment and try to assure that the JAS is complete and on the way but needs to call patient prior to shipping    Consulted and Agree with Plan of Care Patient             Patient will benefit from skilled therapeutic intervention in order to improve the following deficits and impairments:  Abnormal gait, Decreased range of motion, Difficulty walking, Decreased activity tolerance, Pain, Decreased balance, Impaired flexibility, Decreased strength, Decreased mobility  Visit Diagnosis: Chronic pain of left knee  Difficulty in walking, not elsewhere classified     Problem List Patient Active Problem List   Diagnosis Date Noted   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 [place], [place], [address] Phone: [phone]   Fax:  [phone]  Name: Nicholas Mahoney MRN: [id number] Date of Birth: Jul 05, 1946

## 2021-12-20 ENCOUNTER — Encounter: Payer: Self-pay | Admitting: Physical Therapy

## 2021-12-20 ENCOUNTER — Ambulatory Visit: Payer: Medicare Other | Admitting: Physical Therapy

## 2021-12-20 DIAGNOSIS — G8929 Other chronic pain: Secondary | ICD-10-CM

## 2021-12-20 DIAGNOSIS — R262 Difficulty in walking, not elsewhere classified: Secondary | ICD-10-CM

## 2021-12-20 DIAGNOSIS — M25562 Pain in left knee: Secondary | ICD-10-CM | POA: Diagnosis not present

## 2021-12-20 NOTE — Therapy (Signed)
[place]. [place], [place], [address] Phone: [phone]   Fax:  [phone]  Physical Therapy Treatment  Patient Details  Name: Nicholas Mahoney MRN: [id number] Date of Birth: 07-03-1946 Referring Provider (PT): Aluisio   Encounter Date: 12/20/2021   PT End of Session - 12/20/21 1620     Visit Number 12    Date for PT Re-Evaluation 12/20/21    Authorization Type [employer]    PT Start Time 1614    PT Stop Time 1700    PT Time Calculation (min) 46 min    Activity Tolerance Patient tolerated treatment well    Behavior During Therapy WFL for tasks assessed/performed             Past Medical History:  Diagnosis Date   Bladder outlet obstruction    BPH (benign prostatic hyperplasia)    ED (erectile dysfunction)    Hyperlipidemia    Lower urinary tract symptoms (LUTS)    Migraines    none recent   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 cad,  normal LVF,  ef 60%,  false positive myoview   GREEN LIGHT LASER TURP (TRANSURETHRAL RESECTION OF PROSTATE N/A 06/13/2015   Procedure: GREEN LIGHT LASER TURP (TRANSURETHRAL RESECTION OF PROSTATE;  Surgeon: [name], MD;  Location: [facility];  Service: Urology;  Laterality: N/A;   HERNIA REPAIR  infant   KNEE ARTHROSCOPY W/ MENISCECTOMY Bilateral left  10-04-2008/  right 11-09-2007   and chondraplasty/  synovectomy   TRANSURETHRAL RESECTION OF PROSTATE N/A 05/18/2019   Procedure: TRANSURETHRAL RESECTION OF THE PROSTATE (TURP);  Surgeon: [name], MD;  Location: [facility];  Service: Urology;  Laterality: N/A;   VASECTOMY  1980's    There were no vitals  filed for this visit.   Subjective Assessment - 12/20/21 1620     Subjective I got a call from [facility], they will ship to me in [place].  Reports trying the balance    Currently in Pain? No/denies                               [facility] Adult PT Treatment/Exercise - 12/20/21 0001       High Level Balance   High Level Balance Comments side step on and off airex, on airex ball toss, head turns, eyes closed, airex balance beam tandem and side stepping      Knee/Hip Exercises: Aerobic   Recumbent Bike level 4 x 6 minutes    Other Aerobic backward walking tmill push with it off 3x 30 seconds      Knee/Hip Exercises: Machines for Strengthening   Other Machine resisted walking all directions 50#      Manual Therapy   Passive ROM focus on TKE heel raised on bolster                       PT Short Term Goals - 10/04/21 1141       PT SHORT TERM GOAL #1   Title independent with initial HEP  Status Achieved               PT Long Term Goals - 12/20/21 1701       PT LONG TERM GOAL #1   Title decrease pain with walking 50%    Status Achieved      PT LONG TERM GOAL #2   Title increae left knee extension to 8 degrees from full extension    Status Partially Met      PT LONG TERM GOAL #3   Title walkwithout device and without using furniture or walls for balance after sitting    Status Achieved      PT LONG TERM GOAL #4   Title independent with advanced HEP    Status Achieved                   Plan - 12/20/21 1702     Clinical Impression Statement Patient still 15 degrees from full extension actively.  He did get the call from [facility], I gave him my information that I have sent them since they are talking and working with him.    PT Next Visit Plan D/C, he is going to [place] and will get JAS delivered there    Consulted and Agree with Plan of Care Patient             Patient will benefit from skilled therapeutic intervention in  order to improve the following deficits and impairments:  Abnormal gait, Decreased range of motion, Difficulty walking, Decreased activity tolerance, Pain, Decreased balance, Impaired flexibility, Decreased strength, Decreased mobility  Visit Diagnosis: Chronic pain of left knee  Difficulty in walking, not elsewhere classified     Problem List Patient Active Problem List   Diagnosis Date Noted   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 [place], [place], [address] Phone: [phone]   Fax:  [phone]  Name: Nicholas Mahoney MRN: [id number] Date of Birth: [birth year]

## 2022-01-09 DIAGNOSIS — S90412A Abrasion, left great toe, initial encounter: Secondary | ICD-10-CM | POA: Diagnosis not present

## 2022-01-09 DIAGNOSIS — L03032 Cellulitis of left toe: Secondary | ICD-10-CM | POA: Diagnosis not present

## 2022-01-09 DIAGNOSIS — X58XXXA Exposure to other specified factors, initial encounter: Secondary | ICD-10-CM | POA: Diagnosis not present

## 2022-01-17 ENCOUNTER — Other Ambulatory Visit: Payer: Medicare Other

## 2022-01-21 ENCOUNTER — Telehealth: Payer: Self-pay

## 2022-01-21 ENCOUNTER — Inpatient Hospital Stay: Payer: Medicare Other | Attending: Oncology

## 2022-01-21 LAB — FERRITIN: Ferritin: 53 ng/mL (ref 24–336)

## 2022-01-21 NOTE — Telephone Encounter (Signed)
Patient gave verbal understanding and had no further questions or concerns  

## 2022-01-21 NOTE — Telephone Encounter (Signed)
-----   Message from Ladell Pier, MD sent at 01/21/2022  1:38 PM EDT ----- Please call patient, ferritin level remains in goal range, follow-up as scheduled

## 2022-01-24 DIAGNOSIS — Z96652 Presence of left artificial knee joint: Secondary | ICD-10-CM | POA: Diagnosis not present

## 2022-01-29 DIAGNOSIS — M7130 Other bursal cyst, unspecified site: Secondary | ICD-10-CM | POA: Diagnosis not present

## 2022-01-29 DIAGNOSIS — D485 Neoplasm of uncertain behavior of skin: Secondary | ICD-10-CM | POA: Diagnosis not present

## 2022-01-29 DIAGNOSIS — C44629 Squamous cell carcinoma of skin of left upper limb, including shoulder: Secondary | ICD-10-CM | POA: Diagnosis not present

## 2022-01-30 ENCOUNTER — Encounter: Payer: Self-pay | Admitting: Physical Therapy

## 2022-01-30 ENCOUNTER — Ambulatory Visit: Payer: Medicare Other | Attending: Orthopedic Surgery | Admitting: Physical Therapy

## 2022-01-30 DIAGNOSIS — R262 Difficulty in walking, not elsewhere classified: Secondary | ICD-10-CM | POA: Insufficient documentation

## 2022-01-30 DIAGNOSIS — G8929 Other chronic pain: Secondary | ICD-10-CM | POA: Insufficient documentation

## 2022-01-30 DIAGNOSIS — M25562 Pain in left knee: Secondary | ICD-10-CM | POA: Insufficient documentation

## 2022-01-30 NOTE — Therapy (Signed)
[place]. [place], [place], [address] Phone: [phone]   Fax:  [phone]  Physical Therapy Treatment  Patient Details  Name: Nicholas Mahoney MRN: [id number] Date of Birth: Sep 24, 1945 Referring Provider (PT): Aluisio   Encounter Date: 01/30/2022   PT End of Session - 01/30/22 1430     Visit Number 13    Date for PT Re-Evaluation 03/02/22    Authorization Type [employer]    PT Start Time 6962    PT Stop Time 9528    PT Time Calculation (min) 44 min    Activity Tolerance Patient tolerated treatment well    Behavior During Therapy WFL for tasks assessed/performed             Past Medical History:  Diagnosis Date   Bladder outlet obstruction    BPH (benign prostatic hyperplasia)    ED (erectile dysfunction)    Hyperlipidemia    Lower urinary tract symptoms (LUTS)    Migraines    none recent   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 cad,  normal LVF,  ef 60%,  false positive myoview   GREEN LIGHT LASER TURP (TRANSURETHRAL RESECTION OF PROSTATE N/A 06/13/2015   Procedure: GREEN LIGHT LASER TURP (TRANSURETHRAL RESECTION OF PROSTATE;  Surgeon: [name], MD;  Location: [facility];  Service: Urology;  Laterality: N/A;   HERNIA REPAIR  infant   KNEE ARTHROSCOPY W/ MENISCECTOMY Bilateral left  10-04-2008/  right 11-09-2007   and chondraplasty/  synovectomy   TRANSURETHRAL RESECTION OF PROSTATE N/A 05/18/2019   Procedure: TRANSURETHRAL RESECTION OF THE PROSTATE (TURP);  Surgeon: [name], MD;  Location: [facility];  Service: Urology;  Laterality: N/A;   VASECTOMY  1980's    There were no vitals  filed for this visit.   Subjective Assessment - 01/30/22 1431     Subjective Patient has been away for about 6 weeks, he did get the JAS brace and we reviewed its use and he demonstrated its use, saw MD, MD feels like it is helping    Currently in Pain? Yes    Pain Score 2     Pain Location Knee    Pain Orientation Left    Aggravating Factors  activity or long periods of sitting                [facility] PT Assessment - 01/30/22 0001       AROM   Left Knee Extension 8                           [facility] Adult PT Treatment/Exercise - 01/30/22 0001       Self-Care   Other Self-Care Comments  use of brace how to and came up with marking it to help with guidance on how to advance slowly and safely      Manual Therapy   Manual therapy comments STM to the left knee anterior and posterior    Passive ROM focus on TKE heel raised on bolster  PT Short Term Goals - 10/04/21 1141       PT SHORT TERM GOAL #1   Title independent with initial HEP    Status Achieved               PT Long Term Goals - 01/30/22 1432       PT LONG TERM GOAL #2   Title increae left knee extension to 8 degrees from full extension    Status Partially Met      PT LONG TERM GOAL #3   Title walkwithout device and without using furniture or walls for balance after sitting    Status Achieved      PT LONG TERM GOAL #4   Title independent with advanced HEP    Status Achieved                   Plan - 01/30/22 1434     Clinical Impression Statement Patient using JAS brace, we went over safe progression with this and came up with a way to mark the brace so he can see where he is, and slowly advance safely.  I did some work on the mms anterior and posterior, he is tight with some knots in the ITB and quads, the HS feel a little loser    PT Frequency 1x / week    PT Duration 6 weeks    PT Treatment/Interventions ADLs/Self Care Home  Management;Electrical Stimulation;Gait training;Stair training;Functional mobility training;Therapeutic activities;Therapeutic exercise;Balance training;Neuromuscular re-education;Manual techniques;Patient/family education;Dry needling    PT Next Visit Plan will continue to work on his ROM as he is using the JAS and work on the extension    Consulted and Agree with Plan of Care Patient             Patient will benefit from skilled therapeutic intervention in order to improve the following deficits and impairments:  Abnormal gait, Decreased range of motion, Difficulty walking, Decreased activity tolerance, Pain, Decreased balance, Impaired flexibility, Decreased strength, Decreased mobility  Visit Diagnosis: Chronic pain of left knee  Difficulty in walking, not elsewhere classified     Problem List Patient Active Problem List   Diagnosis Date Noted   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 [place], [place], [address] Phone: [phone]   Fax:  [phone]  Name: Nicholas Mahoney MRN: [id number] Date of Birth: [birth year]

## 2022-02-05 ENCOUNTER — Encounter: Payer: Self-pay | Admitting: Cardiology

## 2022-02-05 ENCOUNTER — Ambulatory Visit (INDEPENDENT_AMBULATORY_CARE_PROVIDER_SITE_OTHER): Payer: Medicare Other | Admitting: Cardiology

## 2022-02-05 VITALS — BP 126/80 | HR 62 | Ht 73.0 in | Wt 169.0 lb

## 2022-02-05 DIAGNOSIS — I700 Atherosclerosis of aorta: Secondary | ICD-10-CM

## 2022-02-05 MED ORDER — ROSUVASTATIN CALCIUM 10 MG PO TABS: 10.0000 mg | ORAL_TABLET | Freq: Every day | ORAL | 3 refills | Status: DC

## 2022-02-05 NOTE — Progress Notes (Signed)
Cardiology Office Note:    Date:  02/05/2022   ID:  Nicholas Mahoney, DOB 1946/02/01, MRN [id number]  PCP:  [name], [place]  Cardiologist:  None Advanced Practice Provider:  No care team member to display Electrophysiologist:  None       Referring MD: [name], MD     History of Present Illness:    Nicholas Mahoney is a 76 y.o. male here for the follow up of hyperlipidemia and family history of coronary artery disease.   Previously here for the evaluation of overall heart health at the request of Dr. Addison Lank.  Has aortic atherosclerosis, hyperlipidemia managed with simvastatin, hereditary hemochromatosis maintained with scheduled phlebotomy but he has not required phlebotomy in quite some time. GERD, BPH.  Dr. Benay Spice has seen him for his hereditary hemochromatosis.  Last knee replacement in early February 2022.  On exam, he felt as though he was hearing an abdominal bruit.  We ordered a abdominal aortic ultrasound/duplex.  This was normal.  Father - MI, CAD, died 80 Brother - 41 years older, PCI,coronary artery disease Brother - younger DM Mother - Cancer, MI died suddenly  Back in 2009, Feb 25, 2008, had no angiographically significant CAD and normal ejection fraction with false positive nuclear stress test suggestive of inferior wall ischemia.  There was no evidence of CAD once again.  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   At his last appointment he reported excessive diaphoresis. He had lost about 10 to 15 pounds. He had both knees replaced over the prior year. He complained of occasional ankle edema over the last several years, which looked pretty good on exam. Had an ankle injury in college on the right side. Prior lab work showed creatinine 0.75 ALT 26 hemoglobin 14.8.  LDL 86 HDL 71 total cholesterol 136 triglycerides 75.  Today:  He states he has been feeling fine. 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 He states he does not walk as fast as he used to, but he has been working on building up his walking and exercise routine.  He has been taking 20 mg of rosuvastatin and has been tolerating it well.  He has had small prescriptions in the past which do not last him the necessary duration, and since he travels frequently, he wants to make sure he does not run out of medications particularly during those times. He has enjoyed fishing trips to [place], and visited 70 countries.  Of note, he has not needed a phlebotomy for several years now.  He denies any palpitations, chest pain, shortness of breath, or peripheral edema. No lightheadedness, headaches, syncope, orthopnea, or PND.    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 cad,  normal LVF,  ef 60%,  false positive myoview   GREEN LIGHT  LASER TURP (TRANSURETHRAL RESECTION OF PROSTATE N/A 06/13/2015   Procedure: GREEN LIGHT LASER TURP (TRANSURETHRAL RESECTION OF PROSTATE;  Surgeon: [name], MD;  Location: [facility];  Service: Urology;  Laterality: N/A;   HERNIA REPAIR  infant   KNEE ARTHROSCOPY W/ MENISCECTOMY Bilateral left  10-04-2008/  right 11-09-2007   and chondraplasty/  synovectomy   TRANSURETHRAL RESECTION OF PROSTATE N/A 05/18/2019   Procedure: TRANSURETHRAL RESECTION OF THE PROSTATE (TURP);  Surgeon: [name], MD;  Location: [facility];  Service:  Urology;  Laterality: N/A;   VASECTOMY  1980's    Current Medications: Current Meds  Medication Sig   rosuvastatin (CRESTOR) 10 MG tablet Take 1 tablet (10 mg total) by mouth daily.     Allergies:   Patient has no known allergies.   Social History   Socioeconomic History   Marital status: Married    Spouse name: Not on file   Number of children: Not on file   Years of education: Not on file   Highest education level: Not on file  Occupational History   Not on file  Tobacco Use   Smoking status: Never   Smokeless tobacco: Never  Vaping Use   Vaping Use: Never used  Substance and Sexual Activity   Alcohol use: Yes    Alcohol/week: 1.0 - 2.0 standard drink of alcohol    Types: 1 - 2 Glasses of wine per week    Comment: occ   Drug use: No   Sexual activity: Not on file    Comment: vasectomy  Other Topics Concern   Not on file  Social History Narrative   Not on file   Social Determinants of Health   Financial Resource Strain: Not on file  Food Insecurity: Not on file  Transportation Needs: Not on file  Physical Activity: Not on file  Stress: Not on file  Social Connections: Not on file     Family History: The patient's family history includes Cancer in his father and mother; Coronary artery disease in his brother and mother; Diabetes in his brother and mother; Heart attack in his father.  ROS:   Please see the history of present illness.    All other systems reviewed and are negative.  EKGs/Labs/Other Studies Reviewed:    The following studies were reviewed today:  Coronary Calcium Score  12/12/2020: FINDINGS: Coronary arteries: Normal origins.   Coronary Calcium Score:   Left main: 0   Left anterior descending artery: 0   Left circumflex artery: 0   Right coronary artery: 0   Total: 0   Percentile: 0   Pericardium: Normal.   Ascending Aorta: Normal caliber. Scattered calcifications in the ascending and descending aorta.   Non-cardiac: See  separate report from [facility].   IMPRESSION: Coronary calcium score of 0. This was 0 percentile for age-, race-, and sex-matched controls.  AAA duplex 10/10/2020: Summary:  Abdominal Aorta: No evidence of an abdominal aortic aneurysm was  visualized. The largest aortic measurement is 2.7 cm. Abnormal dilatation  of the abdominal aorta is noted. The largest diameter is at the mid  segment. No previous exam available for  comparison.  Stenosis:  Widely patent bilateral common and external iliac arteries without  evidence of atherosclerosis; no stenosis.   IVC/Iliac: Patent IVC.   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  - There is no evidence  of deep vein thrombosis in the lower extremity.     - No cystic structure found in the popliteal fossa.     LEFT:  - No evidence of deep vein thrombosis in the lower extremity. No indirect  evidence of obstruction proximal to the inguinal ligament.     *See table(s) above for measurements and observations.   EKG:  EKG is personally reviewed. 02/05/2022:  Sinus rhythm. Rate 62 bpm. 10/12/2020:  sinus rhythm 79 no other abnormalities  Recent Labs: No results found for requested labs within last 365 days.  Recent Lipid Panel    Component Value Date/Time   CHOL 115 01/19/2021 0730   TRIG 52 01/19/2021 0730   HDL 61 01/19/2021 0730   CHOLHDL 1.9 01/19/2021 0730   CHOLHDL 3 05/10/2013 0744   VLDL 13.6 05/10/2013 0744   LDLCALC 42 01/19/2021 0730     Risk Assessment/Calculations:      Physical Exam:    VS:  BP 126/80 (BP Location: Left Arm, Patient Position: Sitting, Cuff Size: Normal)   Pulse 62   Ht '6\' 1"'$  (1.854 m)   Wt 169 lb (76.7 kg)   BMI 22.30 kg/m     Wt Readings from Last 3 Encounters:  02/05/22 169 lb (76.7 kg)  05/21/21 169 lb 3.2 oz (76.7 kg)  10/12/20 152 lb (68.9 kg)     GEN:   Well nourished, well developed in no acute distress HEENT: Normal NECK: No JVD; No carotid bruits LYMPHATICS: No lymphadenopathy CARDIAC: RRR, no murmurs, rubs, gallops RESPIRATORY:  Clear to auscultation without rales, wheezing or rhonchi  ABDOMEN: Soft, non-tender, non-distended MUSCULOSKELETAL:  No edema; No deformity  SKIN: Warm and dry NEUROLOGIC:  Alert and oriented x 3 PSYCHIATRIC:  Normal affect   ASSESSMENT:    1. Aortic atherosclerosis ([place])     PLAN:    In order of problems listed above:  Aortic atherosclerosis -This was originally seen on MRI of the abdomen in 2018. - Was on simvastatin.  Now on Crestor.  We will go ahead and adjust his Crestor dose down to 10 mg once a day given that his calcium score is 0.  He does have aortic atherosclerosis and I would like him to be protected with moderate dose of Crestor.  This would be excellent for him. -Thankfully there is no evidence of aortic aneurysm/stenosis on ultrasound.  Reassuring.  Continue with adequate blood pressure control.  Continue with aspirin 81 mg.  Hereditary hemochromatosis -Occasional phlebotomy.  Has not required one in quite some time.  Doing well.  Dr. Benay Spice has been monitoring closely.  Plan is for phlebotomy for any ferritin greater than 100.    Hyperlipidemia - LDL 42 at last check.  This is on Crestor 20.  We will decrease to Crestor 10 mg.  Send out prescription.  Family history of CAD - Lets continue with moderate intensity statin.   Follow-up: 1 year  Medication Adjustments/Labs and Tests Ordered: Current medicines are reviewed at length with the patient today.  Concerns regarding medicines are outlined above.  Orders Placed This Encounter  Procedures   EKG 12-Lead   Meds ordered this encounter  Medications   rosuvastatin (CRESTOR) 10 MG tablet    Sig: Take 1 tablet (10 mg total) by mouth daily.    Dispense:  90 tablet    Refill:  3    Patient Instructions  Medication  Instructions:  Please decrease Crestor to 10 mg a day. Continue all other medications as listed.  *  If you need a refill on your cardiac medications before your next appointment, please call your pharmacy*  Follow-Up: At [facility], you and your health needs are our priority.  As part of our continuing mission to provide you with exceptional heart care, we have created designated Provider Care Teams.  These Care Teams include your primary Cardiologist (physician) and Advanced Practice Providers (APPs -  Physician Assistants and Nurse Practitioners) who all work together to provide you with the care you need, when you need it.  We recommend signing up for the patient portal called "MyChart".  Sign up information is provided on this After Visit Summary.  MyChart is used to connect with patients for Virtual Visits (Telemedicine).  Patients are able to view lab/test results, encounter notes, upcoming appointments, etc.  Non-urgent messages can be sent to your provider as well.   To learn more about what you can do with MyChart, go to [email or web address].    Your next appointment:   1 year(s)  The format for your next appointment:   In Person  Provider:   Dr Candee Furbish  Important Information About Sugar         I,Mathew Stumpf,acting as a scribe for Candee Furbish, MD.,have documented all relevant documentation on the behalf of Candee Furbish, MD,as directed by  Candee Furbish, MD while in the presence of Candee Furbish, MD.  I, Candee Furbish, MD, have reviewed all documentation for this visit. The documentation on 02/05/22 for the exam, diagnosis, procedures, and orders are all accurate and complete.   Signed, Candee Furbish, MD  02/05/2022 4:54 PM     [facility]

## 2022-02-05 NOTE — Patient Instructions (Signed)
Medication Instructions:  Please decrease Crestor to 10 mg a day. Continue all other medications as listed.  *If you need a refill on your cardiac medications before your next appointment, please call your pharmacy*  Follow-Up: At [facility], you and your health needs are our priority.  As part of our continuing mission to provide you with exceptional heart care, we have created designated Provider Care Teams.  These Care Teams include your primary Cardiologist (physician) and Advanced Practice Providers (APPs -  Physician Assistants and Nurse Practitioners) who all work together to provide you with the care you need, when you need it.  We recommend signing up for the patient portal called "MyChart".  Sign up information is provided on this After Visit Summary.  MyChart is used to connect with patients for Virtual Visits (Telemedicine).  Patients are able to view lab/test results, encounter notes, upcoming appointments, etc.  Non-urgent messages can be sent to your provider as well.   To learn more about what you can do with MyChart, go to [email or web address].    Your next appointment:   1 year(s)  The format for your next appointment:   In Person  Provider:   Dr Candee Furbish  Important Information About Sugar

## 2022-02-06 ENCOUNTER — Ambulatory Visit: Payer: Medicare Other | Attending: Orthopedic Surgery | Admitting: Physical Therapy

## 2022-02-06 ENCOUNTER — Encounter: Payer: Self-pay | Admitting: Physical Therapy

## 2022-02-06 DIAGNOSIS — G8929 Other chronic pain: Secondary | ICD-10-CM | POA: Diagnosis not present

## 2022-02-06 DIAGNOSIS — M25562 Pain in left knee: Secondary | ICD-10-CM | POA: Insufficient documentation

## 2022-02-06 DIAGNOSIS — R262 Difficulty in walking, not elsewhere classified: Secondary | ICD-10-CM | POA: Insufficient documentation

## 2022-02-06 NOTE — Therapy (Signed)
[place]. [place], [place], [address] Phone: [phone]   Fax:  [phone]  Physical Therapy Treatment  Patient Details  Name: Nicholas Mahoney MRN: [id number] Date of Birth: Nov 07, 1945 Referring Provider (PT): Aluisio   Encounter Date: 02/06/2022   PT End of Session - 02/06/22 1621     Visit Number 14    Date for PT Re-Evaluation 03/02/22    Authorization Type [employer]    PT Start Time 1614    PT Stop Time 1700    PT Time Calculation (min) 46 min    Activity Tolerance Patient tolerated treatment well    Behavior During Therapy WFL for tasks assessed/performed             Past Medical History:  Diagnosis Date   Bladder outlet obstruction    BPH (benign prostatic hyperplasia)    ED (erectile dysfunction)    Hyperlipidemia    Lower urinary tract symptoms (LUTS)    Migraines    none recent   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 cad,  normal LVF,  ef 60%,  false positive myoview   GREEN LIGHT LASER TURP (TRANSURETHRAL RESECTION OF PROSTATE N/A 06/13/2015   Procedure: GREEN LIGHT LASER TURP (TRANSURETHRAL RESECTION OF PROSTATE;  Surgeon: [name], MD;  Location: [facility];  Service: Urology;  Laterality: N/A;   HERNIA REPAIR  infant   KNEE ARTHROSCOPY W/ MENISCECTOMY Bilateral left  10-04-2008/  right 11-09-2007   and chondraplasty/  synovectomy   TRANSURETHRAL RESECTION OF PROSTATE N/A 05/18/2019   Procedure: TRANSURETHRAL RESECTION OF THE PROSTATE (TURP);  Surgeon: [name], MD;  Location: [facility];  Service: Urology;  Laterality: N/A;   VASECTOMY  1980's    There were no vitals  filed for this visit.   Subjective Assessment - 02/06/22 1622     Subjective Doing okay, I really like the balance exercises that you gave me but I do have difficulty with it    Currently in Pain? No/denies                               [facility] Adult PT Treatment/Exercise - 02/06/22 0001       High Level Balance   High Level Balance Comments side step on and off airex, on airex ball toss, head turns, eyes closed, airex balance beam tandem and side stepping, volleyball beach ball on solid and dynamic surfaces, airex balance beam side stepping      Knee/Hip Exercises: Plyometrics   Other Plyometric Exercises light minitramp bounce, marches      Manual Therapy   Passive ROM focus on TKE heel raised on bolster                       PT Short Term Goals - 10/04/21 1141       PT SHORT TERM GOAL #1   Title independent with initial HEP    Status Achieved               PT Long  Term Goals - 02/06/22 1700       PT LONG TERM GOAL #2   Title increae left knee extension to 8 degrees from full extension    Status Partially Met                   Plan - 02/06/22 1700     Clinical Impression Statement Patient was not doing as well today with his balance, seemed to be more back on his heels and did lose balnce to the back multiple times with any balance activity and did take small steps backwards when putting things down and when side stepping, he agains truggled more today with this,    PT Next Visit Plan he will be on vacation over the next two weeks    Consulted and Agree with Plan of Care Patient             Patient will benefit from skilled therapeutic intervention in order to improve the following deficits and impairments:  Abnormal gait, Decreased range of motion, Difficulty walking, Decreased activity tolerance, Pain, Decreased balance, Impaired flexibility, Decreased strength, Decreased mobility  Visit Diagnosis: Chronic pain  of left knee  Difficulty in walking, not elsewhere classified     Problem List Patient Active Problem List   Diagnosis Date Noted   Hereditary hemochromatosis ([place]) 04/10/2017    Sumner Boast, PT 02/06/2022, 5:03 PM  [place]. [place], [place], [address] Phone: [phone]   Fax:  [phone]  Name: Nicholas Mahoney MRN: [id number] Date of Birth: 05-Dec-1945

## 2022-02-25 DIAGNOSIS — Z23 Encounter for immunization: Secondary | ICD-10-CM | POA: Diagnosis not present

## 2022-02-25 DIAGNOSIS — C44629 Squamous cell carcinoma of skin of left upper limb, including shoulder: Secondary | ICD-10-CM | POA: Diagnosis not present

## 2022-02-26 ENCOUNTER — Ambulatory Visit: Payer: Medicare Other | Admitting: Physical Therapy

## 2022-02-26 ENCOUNTER — Encounter: Payer: Self-pay | Admitting: Physical Therapy

## 2022-02-26 DIAGNOSIS — R262 Difficulty in walking, not elsewhere classified: Secondary | ICD-10-CM

## 2022-02-26 DIAGNOSIS — G8929 Other chronic pain: Secondary | ICD-10-CM

## 2022-02-26 DIAGNOSIS — M25562 Pain in left knee: Secondary | ICD-10-CM | POA: Diagnosis not present

## 2022-02-26 NOTE — Therapy (Signed)
[place]. [place], [place], [address] Phone: [phone]   Fax:  [phone]  Physical Therapy Treatment  Patient Details  Name: YAZEED PRYER MRN: [id number] Date of Birth: 03-21-1946 Referring Provider (PT): Aluisio   Encounter Date: 02/26/2022   PT End of Session - 02/26/22 1103     Visit Number 15    Date for PT Re-Evaluation 03/02/22    Authorization Type [employer]    PT Start Time 6962    PT Stop Time 1100    PT Time Calculation (min) 45 min    Activity Tolerance Patient tolerated treatment well    Behavior During Therapy WFL for tasks assessed/performed             Past Medical History:  Diagnosis Date   Bladder outlet obstruction    BPH (benign prostatic hyperplasia)    ED (erectile dysfunction)    Hyperlipidemia    Lower urinary tract symptoms (LUTS)    Migraines    none recent   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 cad,  normal LVF,  ef 60%,  false positive myoview   GREEN LIGHT LASER TURP (TRANSURETHRAL RESECTION OF PROSTATE N/A 06/13/2015   Procedure: GREEN LIGHT LASER TURP (TRANSURETHRAL RESECTION OF PROSTATE;  Surgeon: [name], MD;  Location: [facility];  Service: Urology;  Laterality: N/A;   HERNIA REPAIR  infant   KNEE ARTHROSCOPY W/ MENISCECTOMY Bilateral left  10-04-2008/  right 11-09-2007   and chondraplasty/  synovectomy   TRANSURETHRAL RESECTION OF PROSTATE N/A 05/18/2019   Procedure: TRANSURETHRAL RESECTION OF THE PROSTATE (TURP);  Surgeon: [name], MD;  Location: [facility];  Service: Urology;  Laterality: N/A;   VASECTOMY  1980's    There were no vitals  filed for this visit.   Subjective Assessment - 02/26/22 1146     Subjective Patient reports that he was on a cruise with "older folks" and I thought I did well, some balance issues at times, reports that he would like to be a little faster with walking and have better balance.    Currently in Pain? No/denies                               [facility] Adult PT Treatment/Exercise - 02/26/22 0001       High Level Balance   High Level Balance Comments side step on and off airex, cone toe touchec and hand touches while on airex, on airex back to wall, butt and scapular touches to get hip strategy working, volley ball solid surface and then on an exercise mat, tried the airex but this was too unstable      Knee/Hip Exercises: Aerobic   Elliptical on tmill, walking 2 mph x 30 seconds and then up to 3 mph for 3 seconds did this for 4 minutes to work on speed    Nustep Level 6 x 6 minutes      Manual Therapy   Passive ROM focus on TKE heel raised on bolster  PT Short Term Goals - 10/04/21 1141       PT SHORT TERM GOAL #1   Title independent with initial HEP    Status Achieved               PT Long Term Goals - 02/26/22 1147       PT LONG TERM GOAL #2   Title increae left knee extension to 8 degrees from full extension    Status Partially Met      PT LONG TERM GOAL #5   Title assess Berg next visit                    Patient will benefit from skilled therapeutic intervention in order to improve the following deficits and impairments:     Visit Diagnosis: Chronic pain of left knee  Difficulty in walking, not elsewhere classified     Problem List Patient Active Problem List   Diagnosis Date Noted   Hereditary hemochromatosis ([facility]) 04/10/2017    , W, PT 02/26/2022, 11:50 AM  [facility] [address] [place], [place], [address] Phone:  [phone]   Fax:  [phone]  Name: Lash L Balon MRN: [id number] Date of Birth: 07/20/1945    

## 2022-02-28 DIAGNOSIS — N401 Enlarged prostate with lower urinary tract symptoms: Secondary | ICD-10-CM | POA: Diagnosis not present

## 2022-02-28 DIAGNOSIS — N281 Cyst of kidney, acquired: Secondary | ICD-10-CM | POA: Diagnosis not present

## 2022-02-28 DIAGNOSIS — R3912 Poor urinary stream: Secondary | ICD-10-CM | POA: Diagnosis not present

## 2022-02-28 DIAGNOSIS — R310 Gross hematuria: Secondary | ICD-10-CM | POA: Diagnosis not present

## 2022-03-01 ENCOUNTER — Other Ambulatory Visit: Payer: Self-pay | Admitting: Urology

## 2022-03-04 ENCOUNTER — Encounter (HOSPITAL_BASED_OUTPATIENT_CLINIC_OR_DEPARTMENT_OTHER): Payer: Self-pay | Admitting: Urology

## 2022-03-05 ENCOUNTER — Ambulatory Visit: Payer: Medicare Other | Admitting: Physical Therapy

## 2022-03-05 ENCOUNTER — Encounter: Payer: Self-pay | Admitting: Physical Therapy

## 2022-03-05 DIAGNOSIS — R262 Difficulty in walking, not elsewhere classified: Secondary | ICD-10-CM | POA: Diagnosis not present

## 2022-03-05 DIAGNOSIS — M25562 Pain in left knee: Secondary | ICD-10-CM | POA: Diagnosis not present

## 2022-03-05 DIAGNOSIS — G8929 Other chronic pain: Secondary | ICD-10-CM

## 2022-03-05 NOTE — Therapy (Signed)
[address] [place], [place], [address] Phone: [phone]   Fax:  [phone]  Physical Therapy Treatment  Patient Details  Name: Nicholas Mahoney MRN: [id number] Date of Birth: 04/06/1946 Referring Provider (PT): Aluisio   Encounter Date: 03/05/2022   PT End of Session - 03/05/22 1018     Visit Number 16    Date for PT Re-Evaluation 04/02/22    Authorization Type [employer]    PT Start Time 8757    PT Stop Time 1100    PT Time Calculation (min) 45 min    Activity Tolerance Patient tolerated treatment well    Behavior During Therapy [facility] for tasks assessed/performed             Past Medical History:  Diagnosis Date   Bladder outlet obstruction    BPH (benign prostatic hyperplasia)    ED (erectile dysfunction)    Hereditary hemochromatosis ([place])    followed by dr Larose Hires 05-21-2021, no recent theraputic phlebotomy needed   Hyperlipidemia    Lower urinary tract symptoms (LUTS)    Migraines    none recent   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 cad,  normal LVF,  ef 60%,  false positive myoview   GREEN LIGHT LASER TURP (TRANSURETHRAL RESECTION OF PROSTATE N/A 06/13/2015   Procedure: GREEN LIGHT LASER TURP (TRANSURETHRAL RESECTION OF PROSTATE;  Surgeon: [name], MD;  Location: [place];  Service: Urology;  Laterality: N/A;   HERNIA REPAIR  infant   KNEE ARTHROSCOPY W/ MENISCECTOMY Bilateral left  10-04-2008/  right 11-09-2007   and chondraplasty/  synovectomy   TRANSURETHRAL RESECTION OF PROSTATE N/A 05/18/2019   Procedure: TRANSURETHRAL RESECTION OF THE PROSTATE (TURP);  Surgeon: [name], MD;   Location: [facility];  Service: Urology;  Laterality: N/A;   VASECTOMY  1980's    There were no vitals filed for this visit.   Subjective Assessment - 03/05/22 1019     Subjective I have tried to use the treadmill some I hold on but I am working up to 3 miles an hour for my speed, I think that will help    Currently in Pain? No/denies                [facility] PT Assessment - 03/05/22 0001       Standardized Balance Assessment   Standardized Balance Assessment Berg Balance Test      Berg Balance Test   Sit to Stand Able to stand without using hands and stabilize independently    Standing Unsupported Able to stand safely 2 minutes    Sitting with Back Unsupported but Feet Supported on Floor or Stool Able to sit safely and securely 2 minutes    Stand to Sit Sits safely with minimal use of hands    Transfers Able to transfer safely, definite need of hands    Standing Unsupported with Eyes Closed Able to stand 3 seconds    Standing Unsupported with Feet Together Able to place feet together independently but unable to hold for 30 seconds    From Standing, Reach Forward with  Outstretched Arm Can reach forward >12 cm safely (5")    From Standing Position, Pick up Object from [place] to pick up shoe safely and easily    From Standing Position, Turn to Look Behind Over each Shoulder Turn sideways only but maintains balance    Turn 360 Degrees Able to turn 360 degrees safely one side only in 4 seconds or less    Standing Unsupported, Alternately Place Feet on Step/Stool Able to stand independently and complete 8 steps >20 seconds    Standing Unsupported, One Foot in Front Needs help to step but can hold 15 seconds    Standing on One Leg Tries to lift leg/unable to hold 3 seconds but remains standing independently    Total Score 40                           [facility] Adult PT Treatment/Exercise - 03/05/22 0001       Ambulation/Gait   Gait Comments outside  around the back building working on speed, seems to be doing well      High Level Balance   High Level Balance Comments side step on and off airex, cone toe touchec and hand touches while on airex, volley ball on solid surface on double folded mat and on the aires, side step on and off airex, side stepping on airex balance beam      Knee/Hip Exercises: Aerobic   Recumbent Bike level 4.5 x 6 minutes      Knee/Hip Exercises: Machines for Strengthening   Cybex Knee Extension 5# left only, really focus on the TKE, decresaed weight today to get better end range    Cybex Knee Flexion LLE 25lb 2x15                       PT Short Term Goals - 10/04/21 1141       PT SHORT TERM GOAL #1   Title independent with initial HEP    Status Achieved               PT Long Term Goals - 03/05/22 1019       PT LONG TERM GOAL #1   Title decrease pain with walking 50%    Status Achieved      PT LONG TERM GOAL #2   Title increae left knee extension to 8 degrees from full extension    Status Partially Met      PT LONG TERM GOAL #3   Title walkwithout device and without using furniture or walls for balance after sitting    Status Achieved      PT LONG TERM GOAL #4   Title independent with advanced HEP    Status Achieved      PT LONG TERM GOAL #5   Title incresae Berg balance score to 47/56    Baseline 40/56    Time 8    Period Weeks    Status New                   Plan - 03/05/22 1151     Clinical Impression Statement Patient reports that he has some blood in his urine and does not want to do much straining, I focused on balance, did the Berg he scored a 40/56, I do think this is better balance overall as he was very much on his heels and required holding onto items in the past few months,  we now have a target and his baseline, again I feel that his speed is better, he demonstrated good knee extension, still using the JAS splint at home for extension, his goals are  to have better balance and walk faster    PT Frequency 1x / week    PT Duration 8 weeks    PT Treatment/Interventions ADLs/Self Care Home Management;Electrical Stimulation;Gait training;Stair training;Functional mobility training;Therapeutic activities;Therapeutic exercise;Balance training;Neuromuscular re-education;Manual techniques;Patient/family education;Dry needling    PT Next Visit Plan will work on balance and speed of gait, he may have a bladder/prostate surgery in the near future    Consulted and Agree with Plan of Care Patient             Patient will benefit from skilled therapeutic intervention in order to improve the following deficits and impairments:  Abnormal gait, Decreased range of motion, Difficulty walking, Decreased activity tolerance, Pain, Decreased balance, Impaired flexibility, Decreased strength, Decreased mobility  Visit Diagnosis: Chronic pain of left knee  Difficulty in walking, not elsewhere classified     Problem List Patient Active Problem List   Diagnosis Date Noted   Hereditary hemochromatosis ([place]) 04/10/2017    Sumner Boast, PT 03/05/2022, 11:57 AM  [address] [place], [place], [address] Phone: [phone]   Fax:  [phone]  Name: Nicholas Mahoney MRN: [id number] Date of Birth: [birth year]

## 2022-03-06 ENCOUNTER — Encounter (HOSPITAL_BASED_OUTPATIENT_CLINIC_OR_DEPARTMENT_OTHER): Payer: Self-pay | Admitting: Urology

## 2022-03-07 ENCOUNTER — Other Ambulatory Visit: Payer: Self-pay

## 2022-03-07 ENCOUNTER — Encounter (HOSPITAL_BASED_OUTPATIENT_CLINIC_OR_DEPARTMENT_OTHER): Payer: Self-pay | Admitting: Urology

## 2022-03-07 NOTE — Progress Notes (Signed)
Spoke w/ via phone for pre-op interview---pt Lab needs dos----  none             Lab results------ COVID test -----patient states asymptomatic no test needed Arrive at -------1230 pm 03-19-2022 NPO after MN NO Solid Food.  Clear liquids from MN until--- Med rec completed Medications to take morning of surgery -----none Diabetic medication -----n/a Patient instructed no nail polish to be worn day of surgery Patient instructed to bring photo id and insurance card day of surgery Patient aware to have Driver (ride ) / caregiver    wife carter cell [phone] for 24 hours after surgery  Patient Special Instructions -----none Pre-Op special Istructions -----pt aware to stop 81 mg aspirin 5 days before surgery per dr Junious Silk instructions Patient verbalized understanding of instructions that were given at this phone interview. Patient denies shortness of breath, chest pain, fever, cough at this phone interview.   [facility] cardiology dr m skains 02-05-2022 epic Ekg 02-05-2022 chart/epic Lov hematology dr g sherrill 05-21-2021

## 2022-03-08 DIAGNOSIS — N281 Cyst of kidney, acquired: Secondary | ICD-10-CM | POA: Diagnosis not present

## 2022-03-08 DIAGNOSIS — R319 Hematuria, unspecified: Secondary | ICD-10-CM | POA: Diagnosis not present

## 2022-03-08 DIAGNOSIS — R310 Gross hematuria: Secondary | ICD-10-CM | POA: Diagnosis not present

## 2022-03-18 NOTE — H&P (Signed)
Office Visit Report     02/28/2022   --------------------------------------------------------------------------------   Nicholas Mahoney  MRN: [id number]  DOB: [birth year], 76 year old Male  SSN: [id number]   PRIMARY CARE:  [name], MD  REFERRING:  Georgette Dover, MD  PROVIDER:  [name], M.D.  LOCATION:  [facility] [phone]     --------------------------------------------------------------------------------   CC/HPI: F/u -   1) BPH - since 2009. GL PVP in 2016 and TURP in 2020. H/o retention reuiring CIC and hematuria from BPH. Tried tamsulosin, daily cialis.   -October 2016 episode of retention == UDS - Lower capacity bladder, 275 mL, normal sensation. Mild instability. He voided with a pressure of 52-53 cm of water, Q max 8 cc/s for equivocal to obstructive flow pattern. He emptied to completion. There was some trabeculation and small diverticuli. EMG was quiet.   PSA stable at 2.81 Jul 2018. He carries tamsulosin if needed and hasn't had any LUTS. PVR 0 and UA clear. F/u PSA 2.66. He takes tamsulosin and a catheter with him when he travels but hasn't needed either. Reports "puffiness" at side of old hernia repair (age 18).   PSA lower at 1.92 in 2020. AUASS 13 and now 11. Off meds. He's had some RGE. He has right inguinal hernia. Needs a new rx for tamsulosin PRN for travel. PSA was low at 2.15 in Mar 2023.    2) ED - mild erectile dysfunction, but breaks a half of a 50 mg Viagra about 50 percent of the time. Patient started sildenafil 20 mg in 2015. 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 Also an appendectomy in 1980.    3) MH, gross hematuria - Cysto 09/2016 done for hematuria eval benign. May 2020 CT/upper tract imaging. Cysto for hematuria June 2020 with apical regrowth. Occasional hematuria - episode Jan  2021, Feb, Mar, Apr 2021 possibly related to exercise and orgasm. UA clear in office. Cysto 06/21 with a 3-4 mm stone left prostatic urethra causing some friction and bleeding. Brushed off and into bladder.   He had an episode of gross hematuria 12/22 while traveling in [place]. A foley was placed and a stone passed. Foley removed - passed VT here. He missed a trip to [place]. Cysto 01/23, with a good channel and resected prostatic fossa but some friable vessels that might bleed. Voiding with a good stream since foley removed. Renal US Mar 2023 benign -small bilateral renal cysts and hyperechoic foci- stable back to [place] in 2019 and CT in 2020.     Today, seen for the above. He has AUASS =17. He had gross hematuria with clots recently. He showed a picture. Now stopped. Reviewed PSA.     ALLERGIES: No Known Drug Allergies    MEDICATIONS: Tamsulosin Hcl 0.4 mg capsule 1 capsule PO Q HS PRN  Aleve  Aspirin Low Dose 81 MG TABS Oral  Diclofenac Sodium  Hyoscyamine Sulfate 0.125 mg tablet, sublingual  Rosuvastatin Calcium 20 mg tablet  Vitamin D3  Zolpidem Tartrate 10 MG Oral Tablet PRN     GU PSH: Cystoscopy - 07/16/2021, 2021, 2020, 2018 Cystoscopy TURP - 2020 Laser Surgery Prostate - 2016 Locm 300-'399Mg'$ /Ml Iodine,1Ml - 2020, 2018 Remove Hydrocele - 2008 Varicocele repair - 2008 Vasectomy - 2008       PSH Notes: Laser Vaporization With Transurethral Resection  Of Prostate, Appendectomy, Surgery Testis, Surgery Spermatic Cord Excision Of Hydrocele, Surgery Spermatic Cord Excision Of Varicocele, Surgery Of Male Genitalia Vasectomy     skin cancer removed from hand 02-2022   NON-GU PSH: Appendectomy - 2008 Eye Surgery (Unspecified), Bilateral - 2021 Knee Arthroscopy, Left     GU PMH: BPH w/LUTS, PSA was sent - 09/17/2021, He may need UDS for a weak stream. Fossa patent. , - 07/16/2021, - 07/13/2021, Tamsulosin sent . Can take PRN. , - 12/15/2020, - 2021 (Improving), - 2020, - 2020, - 2020, Benign  prostatic hyperplasia with urinary obstruction, - 2017 Gross hematuria, benign eval - check [place] in 1 year - 09/17/2021, check [place] on f/u. He was supposed to flight out to [place] but that is not advisable at this time. He may need fulguration and a minimal TUR of prostate if bleeding recurs. , - 07/16/2021, - 07/13/2021, - 2020, - 2020, - 2020, - 2020, - 2018, Likely from prostate area. He is post-TURP/greenlight in 2016., - 2018 Urinary Urgency - 09/17/2021, Urinary urgency, - 2016 Weak Urinary Stream - 07/16/2021, - 12/15/2020, - 2020 ED due to arterial insufficiency, disc again nature r/b of pde5i - 12/15/2020, Erectile dysfunction due to arterial insufficiency, - 2016 Unil Inguinal Hernia W/O obst or gang,recurrent, non-tender - reducible. Disc referral to Gen Surg and he will cont to monitor. - 12/15/2020, - 2020 BPH w/o LUTS - 2020, - 2019, - 2018, BPH without urinary obstruction, - 2017 Renal cyst - 2018 Obstructive and reflux uropathy, Unspec, Obstructive uropathy - 2016 Urinary Retention, Unspec, Urinary retention - 2016 Other microscopic hematuria, Microscopic Hematuria - 2014      PMH Notes:  2011-08-15 10:07:24 - Note: Neoplasm Of The Prostate Gland  2010-01-31 14:01:08 - Note: No Medical Problems   NON-GU PMH: Bacteriuria, Bacteriuria, asymptomatic - 2016 Encounter for general adult medical examination without abnormal findings, Encounter for preventive health examination - 2016 Adjustment insomnia, Adjustment insomnia - 2014 Arthritis Diverticulosis Hemochromatosis, unspecified Hypercholesterolemia Neoplasm of unspecified behavior of bone, soft tissue, and skin Personal history of colonic polyps Vitamin D deficiency, unspecified    FAMILY HISTORY: Acute Myocardial Infarction - Brother Death In The Family Father - Runs In Family Death In The Family Mother - Mother Diabetes - Brother Family Health Status Number - Mother lymphoma - Mother   SOCIAL HISTORY: Marital Status:  Married Preferred Language: English; Ethnicity: Not Hispanic Or Latino; Race: White Current Smoking Status: Patient has never smoked.  Social Drinker.  Drinks 1 caffeinated drink per day. Patient's occupation is/was Retired.     Notes: Never A Smoker, Alcohol Use, Marital History - Currently Married, Occupation:, Caffeine Use   REVIEW OF SYSTEMS:    GU Review Male:   Patient denies frequent urination, hard to postpone urination, burning/ pain with urination, get up at night to urinate, leakage of urine, stream starts and stops, trouble starting your stream, have to strain to urinate , erection problems, and penile pain.  Gastrointestinal (Upper):   Patient denies nausea, vomiting, and indigestion/ heartburn.  Gastrointestinal (Lower):   Patient denies diarrhea and constipation.  Constitutional:   Patient denies fever, night sweats, weight loss, and fatigue.  Skin:   Patient denies itching and skin rash/ lesion.  Eyes:   Patient denies blurred vision and double vision.  Ears/ Nose/ Throat:   Patient denies sore throat and sinus problems.  Hematologic/Lymphatic:   Patient denies swollen glands and easy bruising.  Cardiovascular:   Patient denies leg swelling and chest pains.  Respiratory:   Patient denies cough and shortness of breath.  Endocrine:   Patient denies excessive thirst.  Musculoskeletal:   Patient denies back pain and joint pain.  Neurological:   Patient denies headaches and dizziness.  Psychologic:   Patient denies depression and anxiety.   VITAL SIGNS: None   MULTI-SYSTEM PHYSICAL EXAMINATION:    Constitutional: Well-nourished. No physical deformities. Normally developed. Good grooming.  Neck: Neck symmetrical, not swollen. Normal tracheal position.  Respiratory: No labored breathing, no use of accessory muscles.   Cardiovascular: Normal temperature, normal extremity pulses, no swelling, no varicosities.  Skin: No paleness, no jaundice, no cyanosis. No lesion, no ulcer, no  rash.  Neurologic / Psychiatric: Oriented to time, oriented to place, oriented to person. No depression, no anxiety, no agitation.  Gastrointestinal: No mass, no tenderness, no rigidity, non obese abdomen.     Complexity of Data:   09/17/21 04/13/19 03/18/18 01/21/17 09/19/15 09/08/14 09/02/13 08/24/12  PSA  Total PSA 2.15 ng/mL 1.92 ng/mL 2.66 ng/mL 2.81 ng/mL 2.49  2.60  3.16  2.36     06/04/05  Hormones  Testosterone, Total 2.91     PROCEDURES:         PVR Ultrasound - 51798  Scanned Volume: 0 cc         Urinalysis w/Scope - 81001 Dipstick Dipstick Cont'd Micro  Specimen: Voided Bilirubin: Neg WBC/hpf: NS (Not Seen)  Color: Yellow Ketones: Neg RBC/hpf: 3 - 10/hpf  Appearance: Clear Blood: 3+ Bacteria: Few (10-25/hpf)  Specific Gravity: 1.025 Protein: Neg Cystals: NS (Not Seen)  pH: 5.0 Urobilinogen: 0.2 Casts: NS (Not Seen)  Glucose: Neg Nitrites: Neg Trichomonas: Not Present    Leukocyte Esterase: Neg Mucous: Present      Epithelial Cells: 0 - 5/hpf      Yeast: NS (Not Seen)      Sperm: Not Present    ASSESSMENT:      ICD-10 Details  1 GU:   Gross hematuria - R31.0 Chronic, Stable - will assess with CT and cystoscopy. he had recent office cysto and since this is recurrent we discussed the nature r/b/a to cystoscopy poss TURP poss TURBT in OR and he elects to proceed.   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  Sildenafil Citrate 20 mg tablet 1-5 tab PO PRN  Start: 10/25/2021  Discontinue: 02/28/2022  - Reason: retrograde ejaculation           Orders Labs BUN/Creatinine, Urine Culture  X-Rays: C.T. Abdomen/Pelvis With and Without I.V. Contrast          Schedule Return Visit/Planned Activity: Next Available Appointment -  Schedule Surgery          Document Letter(s):  Created for Patient: Clinical Summary         Notes:   cc: Dr. Addison Lank    * Signed by [name], M.D. on 03/01/22 at 1:13 PM (EDT)*      The information contained in this medical record document is considered private and confidential patient information. This information can only be used for the medical diagnosis and/or medical services that are being provided by the patient's selected caregivers. This information can only be distributed  outside of the patient's care if the patient agrees and signs waivers of authorization for this information to be sent to an outside source or route.

## 2022-03-19 ENCOUNTER — Encounter (HOSPITAL_BASED_OUTPATIENT_CLINIC_OR_DEPARTMENT_OTHER)
Admission: RE | Disposition: A | Discharge status: Home / Self Care | Payer: Self-pay | Source: Ambulatory Visit | Attending: Urology

## 2022-03-19 ENCOUNTER — Other Ambulatory Visit: Payer: Self-pay

## 2022-03-19 ENCOUNTER — Ambulatory Visit (HOSPITAL_BASED_OUTPATIENT_CLINIC_OR_DEPARTMENT_OTHER)
Admission: RE | Admit: 2022-03-19 | Discharge: 2022-03-19 | Disposition: A | Discharge status: Home / Self Care | Payer: Medicare Other | Source: Ambulatory Visit | Attending: Urology | Admitting: Urology

## 2022-03-19 ENCOUNTER — Ambulatory Visit (HOSPITAL_BASED_OUTPATIENT_CLINIC_OR_DEPARTMENT_OTHER): Payer: Medicare Other | Admitting: Anesthesiology

## 2022-03-19 ENCOUNTER — Encounter (HOSPITAL_BASED_OUTPATIENT_CLINIC_OR_DEPARTMENT_OTHER): Payer: Self-pay | Admitting: Urology

## 2022-03-19 DIAGNOSIS — N281 Cyst of kidney, acquired: Secondary | ICD-10-CM | POA: Insufficient documentation

## 2022-03-19 DIAGNOSIS — N529 Male erectile dysfunction, unspecified: Secondary | ICD-10-CM | POA: Diagnosis not present

## 2022-03-19 DIAGNOSIS — N400 Enlarged prostate without lower urinary tract symptoms: Secondary | ICD-10-CM | POA: Diagnosis not present

## 2022-03-19 DIAGNOSIS — N401 Enlarged prostate with lower urinary tract symptoms: Secondary | ICD-10-CM

## 2022-03-19 DIAGNOSIS — R310 Gross hematuria: Secondary | ICD-10-CM | POA: Diagnosis not present

## 2022-03-19 DIAGNOSIS — Z01818 Encounter for other preprocedural examination: Secondary | ICD-10-CM

## 2022-03-19 DIAGNOSIS — N411 Chronic prostatitis: Secondary | ICD-10-CM | POA: Diagnosis not present

## 2022-03-19 HISTORY — PX: TRANSURETHRAL RESECTION OF PROSTATE: SHX73

## 2022-03-19 HISTORY — DX: Hereditary hemochromatosis: E83.110

## 2022-03-19 SURGERY — TURP (TRANSURETHRAL RESECTION OF PROSTATE)
Anesthesia: General | Site: Prostate

## 2022-03-19 MED ORDER — CEFAZOLIN SODIUM-DEXTROSE 2-4 GM/100ML-% IV SOLN
INTRAVENOUS | Status: AC
  Filled 2022-03-19: qty 100

## 2022-03-19 MED ORDER — PHENYLEPHRINE 80 MCG/ML (10ML) SYRINGE FOR IV PUSH (FOR BLOOD PRESSURE SUPPORT)
PREFILLED_SYRINGE | INTRAVENOUS | Status: AC
  Filled 2022-03-19: qty 10

## 2022-03-19 MED ORDER — FENTANYL CITRATE (PF) 100 MCG/2ML IJ SOLN
INTRAMUSCULAR | Status: AC
  Filled 2022-03-19: qty 2

## 2022-03-19 MED ORDER — LIDOCAINE HCL (PF) 2 % IJ SOLN
INTRAMUSCULAR | Status: AC
  Filled 2022-03-19: qty 20

## 2022-03-19 MED ORDER — CEFAZOLIN SODIUM-DEXTROSE 2-4 GM/100ML-% IV SOLN
2.0000 g | INTRAVENOUS | Status: AC
  Administered 2022-03-19: 2 g via INTRAVENOUS

## 2022-03-19 MED ORDER — EPHEDRINE 5 MG/ML INJ
INTRAVENOUS | Status: AC
  Filled 2022-03-19: qty 5

## 2022-03-19 MED ORDER — PROMETHAZINE HCL 25 MG/ML IJ SOLN: 6.2500 mg | INTRAMUSCULAR | Status: DC | PRN

## 2022-03-19 MED ORDER — STERILE WATER FOR IRRIGATION IR SOLN
Status: DC | PRN
  Administered 2022-03-19: 500 mL

## 2022-03-19 MED ORDER — DEXAMETHASONE SODIUM PHOSPHATE 10 MG/ML IJ SOLN
INTRAMUSCULAR | Status: AC
  Filled 2022-03-19: qty 5

## 2022-03-19 MED ORDER — LIDOCAINE 2% (20 MG/ML) 5 ML SYRINGE
INTRAMUSCULAR | Status: DC | PRN
  Administered 2022-03-19: 100 mg via INTRAVENOUS

## 2022-03-19 MED ORDER — CEPHALEXIN 500 MG PO CAPS: 500.0000 mg | ORAL_CAPSULE | Freq: Every day | ORAL | 0 refills | Status: DC

## 2022-03-19 MED ORDER — SODIUM CHLORIDE 0.9 % IR SOLN
Status: DC | PRN
  Administered 2022-03-19: 3000 mL
  Administered 2022-03-19: 6000 mL

## 2022-03-19 MED ORDER — ONDANSETRON HCL 4 MG/2ML IJ SOLN
INTRAMUSCULAR | Status: DC | PRN
  Administered 2022-03-19: 4 mg via INTRAVENOUS

## 2022-03-19 MED ORDER — AMISULPRIDE (ANTIEMETIC) 5 MG/2ML IV SOLN: 10.0000 mg | Freq: Once | INTRAVENOUS | Status: DC | PRN

## 2022-03-19 MED ORDER — PROPOFOL 10 MG/ML IV BOLUS
INTRAVENOUS | Status: AC
  Filled 2022-03-19: qty 20

## 2022-03-19 MED ORDER — PROPOFOL 10 MG/ML IV BOLUS
INTRAVENOUS | Status: DC | PRN
  Administered 2022-03-19: 150 mg via INTRAVENOUS

## 2022-03-19 MED ORDER — LACTATED RINGERS IV SOLN: INTRAVENOUS | Status: DC

## 2022-03-19 MED ORDER — DEXAMETHASONE SODIUM PHOSPHATE 10 MG/ML IJ SOLN
INTRAMUSCULAR | Status: DC | PRN
  Administered 2022-03-19: 5 mg via INTRAVENOUS

## 2022-03-19 MED ORDER — EPHEDRINE SULFATE (PRESSORS) 50 MG/ML IJ SOLN
INTRAMUSCULAR | Status: DC | PRN
  Administered 2022-03-19: 5 mg via INTRAVENOUS

## 2022-03-19 MED ORDER — ACETAMINOPHEN 500 MG PO TABS
1000.0000 mg | ORAL_TABLET | Freq: Once | ORAL | Status: AC
  Administered 2022-03-19: 1000 mg via ORAL

## 2022-03-19 MED ORDER — FENTANYL CITRATE (PF) 250 MCG/5ML IJ SOLN
INTRAMUSCULAR | Status: DC | PRN
  Administered 2022-03-19: 50 ug via INTRAVENOUS

## 2022-03-19 MED ORDER — ONDANSETRON HCL 4 MG/2ML IJ SOLN
INTRAMUSCULAR | Status: AC
  Filled 2022-03-19: qty 8

## 2022-03-19 MED ORDER — ACETAMINOPHEN 500 MG PO TABS
ORAL_TABLET | ORAL | Status: AC
  Filled 2022-03-19: qty 2

## 2022-03-19 MED ORDER — FENTANYL CITRATE (PF) 100 MCG/2ML IJ SOLN: 25.0000 ug | INTRAMUSCULAR | Status: DC | PRN

## 2022-03-19 SURGICAL SUPPLY — 21 items
BAG DRAIN URO-CYSTO SKYTR STRL (DRAIN) ×2 IMPLANT
BAG DRN RND TRDRP ANRFLXCHMBR (UROLOGICAL SUPPLIES) ×1
BAG DRN UROCATH (DRAIN) ×1
BAG URINE DRAIN 2000ML AR STRL (UROLOGICAL SUPPLIES) ×2 IMPLANT
CATH FOLEY 2WAY SLVR  5CC 18FR (CATHETERS) ×1
CATH FOLEY 2WAY SLVR 5CC 18FR (CATHETERS) IMPLANT
CATH FOLEY 3WAY 30CC 22FR (CATHETERS) ×2 IMPLANT
CLOTH BEACON ORANGE TIMEOUT ST (SAFETY) ×2 IMPLANT
GLOVE BIO SURGEON STRL SZ7.5 (GLOVE) ×2 IMPLANT
GOWN STRL REUS W/TWL LRG LVL3 (GOWN DISPOSABLE) ×2 IMPLANT
HOLDER FOLEY CATH W/STRAP (MISCELLANEOUS) IMPLANT
IV NS IRRIG 3000ML ARTHROMATIC (IV SOLUTION) IMPLANT
KIT TURNOVER CYSTO (KITS) ×2 IMPLANT
LOOP CUT BIPOLAR 24F LRG (ELECTROSURGICAL) ×2 IMPLANT
MANIFOLD NEPTUNE II (INSTRUMENTS) ×2 IMPLANT
PACK CYSTO (CUSTOM PROCEDURE TRAY) ×2 IMPLANT
SYR 30ML LL (SYRINGE) ×2 IMPLANT
SYR TOOMEY IRRIG 70ML (MISCELLANEOUS) ×1
SYRINGE TOOMEY IRRIG 70ML (MISCELLANEOUS) ×2 IMPLANT
TUBE CONNECTING 12X1/4 (SUCTIONS) ×2 IMPLANT
TUBING UROLOGY SET (TUBING) ×2 IMPLANT

## 2022-03-19 NOTE — Anesthesia Preprocedure Evaluation (Addendum)
Anesthesia Evaluation  Patient identified by MRN, date of birth, ID band Patient awake    Reviewed: Allergy & Precautions, NPO status , Patient's Chart, lab work & pertinent test results  Airway Mallampati: II  TM Distance: >3 FB Neck ROM: Full    Dental no notable dental hx. (+) Dental Advisory Given   Pulmonary neg pulmonary ROS,    Pulmonary exam normal        Cardiovascular Exercise Tolerance: Good negative cardio ROS Normal cardiovascular exam     Neuro/Psych negative neurological ROS  negative psych ROS   GI/Hepatic negative GI ROS,   Endo/Other    Renal/GU negative Renal ROS     Musculoskeletal   Abdominal   Peds  Hematology negative hematology ROS (+)   Anesthesia Other Findings   Reproductive/Obstetrics                            Anesthesia Physical  Anesthesia Plan  ASA: 2  Anesthesia Plan: General   Post-op Pain Management: Toradol IV (intra-op)* and Tylenol PO (pre-op)*   Induction: Intravenous  PONV Risk Score and Plan: 3 and Treatment may vary due to age or medical condition, Ondansetron, Dexamethasone and Diphenhydramine  Airway Management Planned: LMA  Additional Equipment: None  Intra-op Plan:   Post-operative Plan: Extubation in OR  Informed Consent: I have reviewed the patients History and Physical, chart, labs and discussed the procedure including the risks, benefits and alternatives for the proposed anesthesia with the patient or authorized representative who has indicated his/her understanding and acceptance.     Dental advisory given  Plan Discussed with: Anesthesiologist and CRNA  Anesthesia Plan Comments:        Anesthesia Quick Evaluation

## 2022-03-19 NOTE — Interval H&P Note (Signed)
History and Physical Interval Note:  03/19/2022 2:26 PM  [name]  has presented today for surgery, with the diagnosis of [place].  The various methods of treatment have been discussed with the patient and family. After consideration of risks, benefits and other options for treatment, the patient has consented to  Procedure(s) with comments: CYSTOSCOPY POSSIBLE TRANSURETHRAL RESECTION OF THE PROSTATE (TURP) PR TRANSURETHRAL RESECTION OF BLADDER TUMOR (N/A) - 1 HR FOR CASE as a surgical intervention.  The patient's history has been reviewed, patient examined, no change in status, stable for surgery.  Rush Landmark returns and is doing well.  He has not had further gross hematuria or clots.  No dysuria or fever.  He has no congestion or sore throat.  He did undergo CT scan of the abdomen and pelvis March 08, 2022 which was benign.  It showed BPH with a prior TURP defect.  Discussed we will be resecting any friable tissue and/or fulgurating any friable vessels along the prostatic urethra and can deal with any other findings in the bladder and prostate if the need arises. Discussed postop Foley catheter.  All questions answered.  He elects to proceed.  I have reviewed the patient's chart and labs.  Questions were answered to the patient's satisfaction.     [name]

## 2022-03-19 NOTE — Transfer of Care (Signed)
Immediate Anesthesia Transfer of Care Note  Patient: Nicholas Mahoney  Procedure(s) Performed: CYSTOSCOPY/ TRANSURETHRAL RESECTION OF THE PROSTATE (TURP) RESIDUAL (Prostate)  Patient Location: PACU  Anesthesia Type:General  Level of Consciousness: drowsy and patient cooperative  Airway & Oxygen Therapy: Patient Spontanous Breathing  Post-op Assessment: Report given to RN and Post -op Vital signs reviewed and stable  Post vital signs: Reviewed and stable  Last Vitals:  Vitals Value Taken Time  BP 156/71 03/19/22 1556  Temp    Pulse 63 03/19/22 1557  Resp 8 03/19/22 1557  SpO2 95 % 03/19/22 1557  Vitals shown include unvalidated device data.  Last Pain:  Vitals:   03/19/22 1220  TempSrc: Oral  PainSc: 0-No pain      Patients Stated Pain Goal: 4 (33/43/56 8616)  Complications: No notable events documented.

## 2022-03-19 NOTE — Op Note (Signed)
Preoperative diagnosis: BPH, gross hematuria Post operative diagnosis: Same  Procedure: TURP residual  Surgeon: Junious Silk  Anesthesia: General  Indication for procedure: Nicholas Mahoney is a 76 year old male with a history of laser vaporization of the prostate.  He has had some bleeding episodes and CT and cystoscopy only revealed some friable vessels and tissue in the prostatic urethra.  Findings: Some residual prostatic urethral tissue and friable vessels.  Bladder with moderate trabeculation, normal capacity.  No stone or foreign body in bladder or mucosal lesions.  Excellent prostate channel.  On exam under anesthesia the penis was circumcised without mass or lesion.  Scrotum appeared normal.  On DRE prostate was about 50 g and smooth without hard area or nodule.  Description of procedure: After consent was obtained patient brought to the operating room.  After adequate anesthesia he was placed in lithotomy position and prepped and draped in the usual sterile fashion.  Timeout was performed to confirm the patient and procedure.  Cystoscope was advanced advanced and the bladder carefully inspected with a 30 and 70 degree lens.  I then passed the continuous-flow sheath with the visual obturator and swapped that out for the loop and handle.  I then just did a minor resection of the prostate from the bladder neck down to the verumontanum mainly left and right lateral lobes and some residual median lobe tissue.  Minimal resection and only necessary fulguration was done.  This created a smooth clear channel without any bleeding.  I did not see another vessel suspicious for bleeding.  Hemostasis was excellent low-pressure.  The chips were evacuated and sent to pathology.  The scope was backed out and an 34 [place] Foley catheter was placed and left to gravity drainage.  Irrigation was clear.  Exam under esthesia was performed.  He was awakened and taken the cover room in stable condition.  Complications:  None  Blood loss: Minimal  Specimens to pathology: TURP chips  Drains: 18 French Foley catheter  Disposition: Patient stable to PACU

## 2022-03-19 NOTE — Discharge Instructions (Addendum)
Transurethral Resection of the Prostate Residual, Care After  The following information offers guidance on how to care for yourself after your procedure. Your health care provider may also give you more specific instructions. If you have problems or questions, contact your health care provider. What can I expect after the procedure? After the procedure, it is common to have: Mild pain in your lower abdomen. Soreness or mild discomfort in your penis or when you urinate. This is from having the catheter inserted during the procedure. A sudden urge to urinate (urgency). A need to urinate often. A small amount of blood in your urine. You may notice some small blood clots in your urine. These are normal. Follow these instructions at home: Medicines Take over-the-counter and prescription medicines only as told by your health care provider. If you were prescribed an antibiotic medicine, take it as told by your health care provider. Do not stop taking the antibiotic even if you start to feel better. Activity A sign showing that a person should not lift anything heavy.   Rest as told by your health care provider. Avoid sitting for a long time without moving. Get up to take short walks every 1-2 hours. This is important to improve blood flow and breathing. Ask for help if you feel weak or unsteady. You may increase your physical activity gradually as you start to feel better. Do not drive or operate machinery until your health care provider says that it is safe. Do not ride in a car for long periods of time, or as told by your health care provider. Avoid intense physical activity for as long as told by your health care provider. Do not lift anything that is heavier than 10 lb (4.5 kg), or the limit that you are told, until your health care provider says that it is safe. Do not have sex until your health care provider approves. Return to your normal activities as told by your health care provider. Ask  your health care provider what activities are safe for you. Preventing constipation Three cups showing dark yellow, yellow, and pale yellow urine.   You may need to take these actions to prevent or treat constipation: Drink enough fluid to keep your urine pale yellow. Take over-the-counter or prescription medicines. Eat foods that are high in fiber, such as beans, whole grains, and fresh fruits and vegetables. Limit foods that are high in fat and processed sugars, such as fried or sweet foods.   General instructions Do not strain when you have a bowel movement. Straining may lead to bleeding from the prostate. This may cause blood clots and trouble urinating. Do not use any products that contain nicotine or tobacco. These products include cigarettes, chewing tobacco, and vaping devices, such as e-cigarettes. If you need help quitting, ask your health care provider. If you go home with a tube draining your urine (urinary catheter), care for the catheter as told by your health care provider. Wear compression stockings as told by your health care provider. These stockings help to prevent blood clots and reduce swelling in your legs. Keep all follow-up visits. This is important. Contact a health care provider if: You have signs of infection, such as: Fever or chills. Urine that smells very bad. Swelling around your urethra that is getting worse. Swelling in your penis or testicles. You have difficulty urinating. You have pain that gets worse or does not improve with medicine. You have blood in your urine that does not go away after 1  week of resting and drinking more fluids. You have trouble having a bowel movement. You have trouble having or keeping an erection. No semen comes out during orgasm (dry ejaculation). You have a urinary catheter in place, and you have: Spasms or pain. Problems with your catheter or your catheter is blocked. Get help right away if: You are unable to  urinate. You are having more blood clots in your urine instead of fewer. You have: Large blood clots. A lot of blood in your urine. Pain in your back or lower abdomen. You have difficulty breathing or shortness of breath. You develop swelling or pain in your leg. These symptoms may be an emergency. Get help right away. Call 911. Do not wait to see if the symptoms will go away. Do not drive yourself to the hospital. Summary After the procedure, it is common to have a small amount of blood in your urine. Follow restrictions about lifting and sexual activity as told by your health care provider. Ask what activities are safe for you. Keep all follow-up visits. This is important. This information is not intended to replace advice given to you by your health care provider. Make sure you discuss any questions you have with your health care provider. Document Revised: 03/20/2021 Document Reviewed: 03/20/2021 Elsevier Patient Education  [place] Instructions  Activity: Get plenty of rest for the remainder of the day. A responsible individual must stay with you for 24 hours following the procedure.  For the next 24 hours, DO NOT: -Drive a car -Paediatric nurse -Drink alcoholic beverages -Take any medication unless instructed by your physician -Make any legal decisions or sign important papers.  Meals: Start with liquid foods such as gelatin or soup. Progress to regular foods as tolerated. Avoid greasy, spicy, heavy foods. If nausea and/or vomiting occur, drink only clear liquids until the nausea and/or vomiting subsides. Call your physician if vomiting continues.  Special Instructions/Symptoms: Your throat may feel dry or sore from the anesthesia or the breathing tube placed in your throat during surgery. If this causes discomfort, gargle with warm salt water. The discomfort should disappear within 24 hours.  No acetaminophen/Tylenol until after 6:40  pm today if needed.

## 2022-03-19 NOTE — Anesthesia Postprocedure Evaluation (Signed)
Anesthesia Post Note  Patient: Nicholas Mahoney  Procedure(s) Performed: CYSTOSCOPY/ TRANSURETHRAL RESECTION OF THE PROSTATE (TURP) RESIDUAL (Prostate)     Patient location during evaluation: PACU Anesthesia Type: General Level of consciousness: sedated Pain management: pain level controlled Vital Signs Assessment: post-procedure vital signs reviewed and stable Respiratory status: spontaneous breathing and respiratory function stable Cardiovascular status: stable Postop Assessment: no apparent nausea or vomiting Anesthetic complications: no   No notable events documented.  Last Vitals:  Vitals:   03/19/22 1615 03/19/22 1705  BP: (!) 148/88 (!) 148/80  Pulse: 67 65  Resp: (!) 6 10  Temp:  36.4 C  SpO2: 97% 99%    Last Pain:  Vitals:   03/19/22 1705  TempSrc:   PainSc: 3                  Otis Burress DANIEL

## 2022-03-19 NOTE — Anesthesia Procedure Notes (Signed)
Procedure Name: LMA Insertion Date/Time: 03/19/2022 3:04 PM  Performed by: Clearnce Sorrel, CRNAPre-anesthesia Checklist: Patient identified, Emergency Drugs available, Suction available and Patient being monitored Patient Re-evaluated:Patient Re-evaluated prior to induction Oxygen Delivery Method: Circle System Utilized Preoxygenation: Pre-oxygenation with 100% oxygen Induction Type: IV induction Ventilation: Mask ventilation without difficulty LMA: LMA inserted LMA Size: 4.0 Number of attempts: 1 Airway Equipment and Method: Bite block Placement Confirmation: positive ETCO2 Tube secured with: Tape Dental Injury: Teeth and Oropharynx as per pre-operative assessment

## 2022-03-20 ENCOUNTER — Encounter (HOSPITAL_BASED_OUTPATIENT_CLINIC_OR_DEPARTMENT_OTHER): Payer: Self-pay | Admitting: Urology

## 2022-03-21 LAB — SURGICAL PATHOLOGY

## 2022-03-26 DIAGNOSIS — Z6823 Body mass index (BMI) 23.0-23.9, adult: Secondary | ICD-10-CM | POA: Diagnosis not present

## 2022-03-26 DIAGNOSIS — M170 Bilateral primary osteoarthritis of knee: Secondary | ICD-10-CM | POA: Diagnosis not present

## 2022-03-26 DIAGNOSIS — N400 Enlarged prostate without lower urinary tract symptoms: Secondary | ICD-10-CM | POA: Diagnosis not present

## 2022-03-26 DIAGNOSIS — R413 Other amnesia: Secondary | ICD-10-CM | POA: Diagnosis not present

## 2022-03-26 DIAGNOSIS — G479 Sleep disorder, unspecified: Secondary | ICD-10-CM | POA: Diagnosis not present

## 2022-03-26 DIAGNOSIS — Z23 Encounter for immunization: Secondary | ICD-10-CM | POA: Diagnosis not present

## 2022-03-26 DIAGNOSIS — I700 Atherosclerosis of aorta: Secondary | ICD-10-CM | POA: Diagnosis not present

## 2022-03-26 DIAGNOSIS — H9190 Unspecified hearing loss, unspecified ear: Secondary | ICD-10-CM | POA: Diagnosis not present

## 2022-03-26 DIAGNOSIS — Z0000 Encounter for general adult medical examination without abnormal findings: Secondary | ICD-10-CM | POA: Diagnosis not present

## 2022-03-26 DIAGNOSIS — Z1331 Encounter for screening for depression: Secondary | ICD-10-CM | POA: Diagnosis not present

## 2022-03-26 DIAGNOSIS — E785 Hyperlipidemia, unspecified: Secondary | ICD-10-CM | POA: Diagnosis not present

## 2022-04-01 ENCOUNTER — Encounter: Payer: Self-pay | Admitting: Physical Therapy

## 2022-04-01 ENCOUNTER — Ambulatory Visit: Payer: Medicare Other | Attending: Orthopedic Surgery | Admitting: Physical Therapy

## 2022-04-01 DIAGNOSIS — G8929 Other chronic pain: Secondary | ICD-10-CM | POA: Insufficient documentation

## 2022-04-01 DIAGNOSIS — M25562 Pain in left knee: Secondary | ICD-10-CM | POA: Diagnosis not present

## 2022-04-01 DIAGNOSIS — R262 Difficulty in walking, not elsewhere classified: Secondary | ICD-10-CM | POA: Diagnosis not present

## 2022-04-01 DIAGNOSIS — R197 Diarrhea, unspecified: Secondary | ICD-10-CM | POA: Diagnosis not present

## 2022-04-01 DIAGNOSIS — Z23 Encounter for immunization: Secondary | ICD-10-CM | POA: Diagnosis not present

## 2022-04-01 DIAGNOSIS — G479 Sleep disorder, unspecified: Secondary | ICD-10-CM | POA: Diagnosis not present

## 2022-04-01 DIAGNOSIS — Z7184 Encounter for health counseling related to travel: Secondary | ICD-10-CM | POA: Diagnosis not present

## 2022-04-01 NOTE — Therapy (Signed)
[place]. [place], [place], [address] Phone: [phone]   Fax:  [phone]  Physical Therapy Treatment  Patient Details  Name: Nicholas Mahoney MRN: [id number] Date of Birth: August 22, 1945 Referring Provider (PT): Aluisio   Encounter Date: 04/01/2022   PT End of Session - 04/01/22 1530     Visit Number 17    Date for PT Re-Evaluation 04/02/22    Authorization Type [employer]    PT Start Time 8850    PT Stop Time 2774    PT Time Calculation (min) 43 min    Activity Tolerance Patient tolerated treatment well    Behavior During Therapy [facility] for tasks assessed/performed             Past Medical History:  Diagnosis Date   Bladder outlet obstruction    BPH (benign prostatic hyperplasia)    ED (erectile dysfunction)    Hereditary hemochromatosis ([place])    followed by dr Larose Hires 05-21-2021, no recent theraputic phlebotomy needed   History of COVID-19 12/2020   asymptomatic   Hyperlipidemia    Lower urinary tract symptoms (LUTS)    OA (osteoarthritis) of knee    Vitamin D deficiency    Wears glasses    Wears hearing aid    bilateral    Past Surgical History:  Procedure Laterality Date   APPENDECTOMY  1980's   CARDIAC CATHETERIZATION     abnormal myoview/  no sig. cad,  normal LVF,  ef 60%,  false positive myoview 01-26-2008  dr Marlou Porch   GREEN LIGHT LASER TURP (TRANSURETHRAL RESECTION OF PROSTATE N/A 06/13/2015   Procedure: GREEN LIGHT LASER TURP (TRANSURETHRAL RESECTION OF PROSTATE;  Surgeon: [name], MD;  Location: [facility];  Service: Urology;  Laterality: N/A;   HERNIA REPAIR  infant   KNEE ARTHROSCOPY W/ MENISCECTOMY Bilateral left  10-04-2008/  right 11-09-2007   and chondraplasty/  synovectomy   TRANSURETHRAL RESECTION OF PROSTATE N/A 05/18/2019   Procedure: TRANSURETHRAL RESECTION OF THE PROSTATE (TURP);  Surgeon: [name], MD;  Location: [facility];  Service: Urology;  Laterality: N/A;   TRANSURETHRAL RESECTION OF PROSTATE N/A 03/19/2022   Procedure: CYSTOSCOPY/ TRANSURETHRAL RESECTION OF THE PROSTATE (TURP) RESIDUAL;  Surgeon: [name], MD;  Location: [facility];  Service: Urology;  Laterality: N/A;   VASECTOMY  1980's    There were no vitals filed for this visit.   Subjective Assessment - 04/01/22 1650     Subjective Patient comes in looking well, reports one fall over the past few weeks    Currently in Pain? No/denies                               [facility] Adult PT Treatment/Exercise - 04/01/22 0001       Ambulation/Gait   Gait Comments outside around the back building working on speed, seems to be doing well, 2 laps      High Level Balance   High Level Balance Comments side step on and off airex, cone toe touchec and hand touches while on airex, volley ball on solid surface on double folded mat and on the aires, side step on and off airex, side stepping on airex balance beam      Manual Therapy   Passive ROM focus on TKE heel raised on bolster  PT Short Term Goals - 10/04/21 1141       PT SHORT TERM GOAL #1   Title independent with initial HEP    Status Achieved               PT Long Term Goals - 04/01/22 1651       PT LONG TERM GOAL #1   Title decrease pain with walking 50%    Status Achieved      PT LONG TERM GOAL #2   Title increae left knee extension to 8 degrees from full extension    Status Achieved      PT LONG TERM GOAL #3   Title walkwithout device and without using furniture or walls for balance after sitting    Status Achieved      PT LONG TERM GOAL #4   Title independent with advanced HEP    Baseline changing this to add more balance as this seems to be his biggest issue right now    Status Partially Met      PT LONG TERM GOAL #5   Title incresae Berg balance score to 47/56    Status Partially Met                    Plan - 04/01/22 1652     Clinical Impression Statement Patient doing better, with his walking, no cane today, able to walk at a good pace areound the back building, minimal to no SOB.  He still struggles with balance, tends to get back on his heels and take multiple steps back, really has issues with being on the foam surfaces    PT Next Visit Plan will work on balance and speed of gait, he is having some issues after his bladder surgery    Consulted and Agree with Plan of Care Patient             Patient will benefit from skilled therapeutic intervention in order to improve the following deficits and impairments:  Abnormal gait, Decreased range of motion, Difficulty walking, Decreased activity tolerance, Pain, Decreased balance, Impaired flexibility, Decreased strength, Decreased mobility  Visit Diagnosis: Chronic pain of left knee  Difficulty in walking, not elsewhere classified     Problem List Patient Active Problem List   Diagnosis Date Noted   Hereditary hemochromatosis ([place]) 04/10/2017    Sumner Boast, PT 04/01/2022, 4:54 PM  [place]. [place], [place], [address] Phone: [phone]   Fax:  [phone]  Name: Nicholas Mahoney MRN: [id number] Date of Birth: 03-16-1946

## 2022-04-03 ENCOUNTER — Encounter: Payer: Self-pay | Admitting: Physical Therapy

## 2022-04-03 ENCOUNTER — Ambulatory Visit: Payer: Medicare Other | Admitting: Physical Therapy

## 2022-04-03 DIAGNOSIS — G8929 Other chronic pain: Secondary | ICD-10-CM | POA: Diagnosis not present

## 2022-04-03 DIAGNOSIS — M25562 Pain in left knee: Secondary | ICD-10-CM | POA: Diagnosis not present

## 2022-04-03 DIAGNOSIS — R262 Difficulty in walking, not elsewhere classified: Secondary | ICD-10-CM

## 2022-04-03 NOTE — Therapy (Signed)
[place]. [place], [place], [address] Phone: [phone]   Fax:  [phone]  Physical Therapy Treatment  Patient Details  Name: Nicholas Mahoney MRN: [id number] Date of Birth: Jan 21, 1946 Referring Provider (PT): Aluisio   Encounter Date: 04/03/2022   PT End of Session - 04/03/22 1402     Visit Number 18    Date for PT Re-Evaluation 05/02/22    Authorization Type [employer]    PT Start Time 7591    PT Stop Time 6384    PT Time Calculation (min) 43 min    Activity Tolerance Patient tolerated treatment well    Behavior During Therapy [facility] for tasks assessed/performed             Past Medical History:  Diagnosis Date   Bladder outlet obstruction    BPH (benign prostatic hyperplasia)    ED (erectile dysfunction)    Hereditary hemochromatosis ([place])    followed by dr Larose Hires 05-21-2021, no recent theraputic phlebotomy needed   History of COVID-19 12/2020   asymptomatic   Hyperlipidemia    Lower urinary tract symptoms (LUTS)    OA (osteoarthritis) of knee    Vitamin D deficiency    Wears glasses    Wears hearing aid    bilateral    Past Surgical History:  Procedure Laterality Date   APPENDECTOMY  1980's   CARDIAC CATHETERIZATION     abnormal myoview/  no sig. cad,  normal LVF,  ef 60%,  false positive myoview 01-26-2008  dr Marlou Porch   GREEN LIGHT LASER TURP (TRANSURETHRAL RESECTION OF PROSTATE N/A 06/13/2015   Procedure: GREEN LIGHT LASER TURP (TRANSURETHRAL RESECTION OF PROSTATE;  Surgeon: [name], MD;  Location[facility];  Service: Urology;  Laterality: N/A;   HERNIA REPAIR  infant   KNEE ARTHROSCOPY W/ MENISCECTOMY Bilateral left  10-04-2008/  right 11-09-2007   and chondraplasty/  synovectomy   TRANSURETHRAL RESECTION OF PROSTATE N/A 05/18/2019   Procedure: TRANSURETHRAL RESECTION OF THE PROSTATE (TURP);  Surgeon: [name], MD;  Location: [facility];  Service: Urology;  Laterality: N/A;   TRANSURETHRAL RESECTION OF PROSTATE N/A 03/19/2022   Procedure: CYSTOSCOPY/ TRANSURETHRAL RESECTION OF THE PROSTATE (TURP) RESIDUAL;  Surgeon: [name], MD;  Location: [facility];  Service: Urology;  Laterality: N/A;   VASECTOMY  1980's    There were no vitals filed for this visit.   Subjective Assessment - 04/03/22 1403     Subjective I am pleased with my walking, I am limping the first few steps after getting up but that goes away after a few steps    Currently in Pain? No/denies                               [facility] Adult PT Treatment/Exercise - 04/03/22 0001       Ambulation/Gait   Gait Comments outside around the back building working on speed, seems to be doing well, 2 laps      Knee/Hip Exercises: Aerobic   Recumbent Bike level 4.5 x 6 minutes      Knee/Hip Exercises: Machines for Strengthening   Cybex Leg Press left only 20# 3x10 wiht tband behind the knee to help with TKE    Other Machine resisted walking all directions 50#      Manual Therapy   Passive ROM did both flexion and extension  PT Short Term Goals - 10/04/21 1141       PT SHORT TERM GOAL #1   Title independent with initial HEP    Status Achieved               PT Long Term Goals - 04/03/22 1403       PT LONG TERM GOAL #1   Title decrease pain with walking 50%    Status Achieved      PT LONG TERM GOAL #2   Title increae left knee extension to 8 degrees from full extension    Status Achieved      PT LONG TERM GOAL #3   Title walkwithout device and without using furniture or walls for balance after sitting    Status Achieved      PT LONG TERM GOAL #4   Title independent with advanced HEP    Status Partially Met      PT LONG TERM GOAL #5   Title incresae Berg balance score to 47/56    Status Partially Met                   Plan - 04/03/22 1447      Clinical Impression Statement Continues to improve with his walking, he does not have fatigue coming up the hill, he is still stiff going into flexiona nd extension.    PT Next Visit Plan see next week and may d/c    Consulted and Agree with Plan of Care Patient             Patient will benefit from skilled therapeutic intervention in order to improve the following deficits and impairments:  Abnormal gait, Decreased range of motion, Difficulty walking, Decreased activity tolerance, Pain, Decreased balance, Impaired flexibility, Decreased strength, Decreased mobility  Visit Diagnosis: Chronic pain of left knee  Difficulty in walking, not elsewhere classified     Problem List Patient Active Problem List   Diagnosis Date Noted   Hereditary hemochromatosis ([place]) 04/10/2017    Sumner Boast, PT 04/03/2022, 2:50 PM  [place]. [place], [place], [address] Phone: [phone]   Fax:  [phone]  Name: Nicholas Mahoney MRN: [id number] Date of Birth: 05-29-1946

## 2022-04-08 ENCOUNTER — Encounter: Payer: Self-pay | Admitting: Physical Therapy

## 2022-04-08 ENCOUNTER — Ambulatory Visit: Payer: Medicare Other | Attending: Orthopedic Surgery | Admitting: Physical Therapy

## 2022-04-08 DIAGNOSIS — G8929 Other chronic pain: Secondary | ICD-10-CM | POA: Insufficient documentation

## 2022-04-08 DIAGNOSIS — M25562 Pain in left knee: Secondary | ICD-10-CM | POA: Diagnosis not present

## 2022-04-08 DIAGNOSIS — R262 Difficulty in walking, not elsewhere classified: Secondary | ICD-10-CM | POA: Insufficient documentation

## 2022-04-08 NOTE — Therapy (Signed)
[facility] [address] [place], [place], [address] Phone: [phone]   Fax:  [phone]  Physical Therapy Treatment  Patient Details  Name: Nicholas Mahoney MRN: [id number] Date of Birth: 09/03/1945 Referring Provider (PT): Aluisio   Encounter Date: 04/08/2022   PT End of Session - 04/08/22 0850     Visit Number 19    Date for PT Re-Evaluation 05/02/22    Authorization Type [employer]    PT Start Time 0843    PT Stop Time 0928    PT Time Calculation (min) 45 min    Activity Tolerance Patient tolerated treatment well    Behavior During Therapy [facility] for tasks assessed/performed             Past Medical History:  Diagnosis Date   Bladder outlet obstruction    BPH (benign prostatic hyperplasia)    ED (erectile dysfunction)    Hereditary hemochromatosis ([facility])    followed by dr sherrill lov 05-21-2021, no recent theraputic phlebotomy needed   History of COVID-19 12/2020   asymptomatic   Hyperlipidemia    Lower urinary tract symptoms (LUTS)    OA (osteoarthritis) of knee    Vitamin D deficiency    Wears glasses    Wears hearing aid    bilateral    Past Surgical History:  Procedure Laterality Date   APPENDECTOMY  1980's   CARDIAC CATHETERIZATION     abnormal myoview/  no sig. cad,  normal LVF,  ef 60%,  false positive myoview 01-26-2008  dr skains   GREEN LIGHT LASER TURP (TRANSURETHRAL RESECTION OF PROSTATE N/A 06/13/2015   Procedure: GREEN LIGHT LASER TURP (TRANSURETHRAL RESECTION OF PROSTATE;  Surgeon: Matthew Eskridge, MD;  Location: [facility];  Service: Urology;  Laterality: N/A;   HERNIA REPAIR  infant   KNEE ARTHROSCOPY W/ MENISCECTOMY Bilateral left  10-04-2008/  right 11-09-2007   and chondraplasty/  synovectomy   TRANSURETHRAL RESECTION OF PROSTATE N/A 05/18/2019   Procedure: TRANSURETHRAL RESECTION OF THE PROSTATE (TURP);  Surgeon: Eskridge, Matthew, MD;  Location: [facility];  Service: Urology;  Laterality: N/A;   TRANSURETHRAL RESECTION OF PROSTATE N/A 03/19/2022   Procedure: CYSTOSCOPY/ TRANSURETHRAL RESECTION OF THE PROSTATE (TURP) RESIDUAL;  Surgeon: Eskridge, Matthew, MD;  Location: [facility];  Service: Urology;  Laterality: N/A;   VASECTOMY  1980's    There were no vitals filed for this visit.   Subjective Assessment - 04/08/22 0850     Subjective Doing pretty good no real issues    Currently in Pain? No/denies                               [facility] Adult PT Treatment/Exercise - 04/08/22 0001       Ambulation/Gait   Gait Comments outside around the back building working on speed, seems to be doing well, 2 laps      High Level Balance   High Level Balance Comments side stepping on and off airex, on airex ball toss, beach ball volley ball trying to get him to move laterally      Knee/Hip Exercises: Aerobic   Elliptical tmill push fwd and backward 20 seconds 4x each    Recumbent Bike level 5 x 6 minutes      Manual Therapy   Passive ROM did both flexion and extension                         PT Short Term Goals - 10/04/21 1141       PT SHORT TERM GOAL #1   Title independent with initial HEP    Status Achieved               PT Long Term Goals - 04/03/22 1403       PT LONG TERM GOAL #1   Title decrease pain with walking 50%    Status Achieved      PT LONG TERM GOAL #2   Title increae left knee extension to 8 degrees from full extension    Status Achieved      PT LONG TERM GOAL #3   Title walkwithout device and without using furniture or walls for balance after sitting    Status Achieved      PT LONG TERM GOAL #4   Title independent with advanced HEP    Status Partially Met      PT LONG TERM GOAL #5   Title incresae Berg balance score to 47/56    Status Partially Met                   Plan - 04/08/22 0943     Clinical Impression Statement No isses iwth the  walking, still on his heels with the balance activities, tolerates the passive extension but is painful    PT Next Visit Plan finalize HEP measure and D/C    Consulted and Agree with Plan of Care Patient             Patient will benefit from skilled therapeutic intervention in order to improve the following deficits and impairments:  Abnormal gait, Decreased range of motion, Difficulty walking, Decreased activity tolerance, Pain, Decreased balance, Impaired flexibility, Decreased strength, Decreased mobility  Visit Diagnosis: Chronic pain of left knee  Difficulty in walking, not elsewhere classified     Problem List Patient Active Problem List   Diagnosis Date Noted   Hereditary hemochromatosis ([facility]) 04/10/2017    , W, PT 04/08/2022, 9:45 AM  [facility] [address] [place], [place], [address] Phone: [phone]   Fax:  [phone]  Name: Nicholas Mahoney MRN: [id number] Date of Birth: 02/26/1946    

## 2022-04-10 ENCOUNTER — Encounter: Payer: Self-pay | Admitting: Physical Therapy

## 2022-04-10 ENCOUNTER — Ambulatory Visit: Payer: Medicare Other | Admitting: Physical Therapy

## 2022-04-10 DIAGNOSIS — G8929 Other chronic pain: Secondary | ICD-10-CM

## 2022-04-10 DIAGNOSIS — R262 Difficulty in walking, not elsewhere classified: Secondary | ICD-10-CM | POA: Diagnosis not present

## 2022-04-10 DIAGNOSIS — M25562 Pain in left knee: Secondary | ICD-10-CM | POA: Diagnosis not present

## 2022-04-10 NOTE — Therapy (Signed)
[place]. [place], [place], [address] Phone: [phone]   Fax:  [phone]  Physical Therapy Treatment  Patient Details  Name: Nicholas Mahoney MRN: [id number] Date of Birth: 1945-11-14 Referring Provider (PT): Aluisio   Encounter Date: 04/10/2022   PT End of Session - 04/10/22 0846     Visit Number 20    Date for PT Re-Evaluation 05/02/22    Authorization Type [employer]    PT Start Time 0845    PT Stop Time 0928    PT Time Calculation (min) 43 min    Activity Tolerance Patient tolerated treatment well    Behavior During Therapy [facility] for tasks assessed/performed             Past Medical History:  Diagnosis Date   Bladder outlet obstruction    BPH (benign prostatic hyperplasia)    ED (erectile dysfunction)    Hereditary hemochromatosis ([place])    followed by dr Larose Hires 05-21-2021, no recent theraputic phlebotomy needed   History of COVID-19 12/2020   asymptomatic   Hyperlipidemia    Lower urinary tract symptoms (LUTS)    OA (osteoarthritis) of knee    Vitamin D deficiency    Wears glasses    Wears hearing aid    bilateral    Past Surgical History:  Procedure Laterality Date   APPENDECTOMY  1980's   CARDIAC CATHETERIZATION     abnormal myoview/  no sig. cad,  normal LVF,  ef 60%,  false positive myoview 01-26-2008  dr Marlou Porch   GREEN LIGHT LASER TURP (TRANSURETHRAL RESECTION OF PROSTATE N/A 06/13/2015   Procedure: GREEN LIGHT LASER TURP (TRANSURETHRAL RESECTION OF PROSTATE;  Surgeon: [name], MD;  Location: [facility];  Service: Urology;  Laterality: N/A;   HERNIA REPAIR  infant   KNEE ARTHROSCOPY W/ MENISCECTOMY Bilateral left  10-04-2008/  right 11-09-2007   and chondraplasty/  synovectomy   TRANSURETHRAL RESECTION OF PROSTATE N/A 05/18/2019   Procedure: TRANSURETHRAL RESECTION OF THE PROSTATE (TURP);  Surgeon: [name], MD;  Location: [facility];  Service: Urology;  Laterality: N/A;   TRANSURETHRAL RESECTION OF PROSTATE N/A 03/19/2022   Procedure: CYSTOSCOPY/ TRANSURETHRAL RESECTION OF THE PROSTATE (TURP) RESIDUAL;  Surgeon: [name], MD;  Location[facility];  Service: Urology;  Laterality: N/A;   VASECTOMY  1980's    There were no vitals filed for this visit.   Subjective Assessment - 04/10/22 0846     Subjective Just stiff at times, feels like it is improving    Currently in Pain? No/denies                [facility] PT Assessment - 04/10/22 0001       AROM   Left Knee Extension 6                           [facility] Adult PT Treatment/Exercise - 04/10/22 0001       Ambulation/Gait   Gait Comments outside around the back building working on speed, seems to be doing well, 2 laps      Knee/Hip Exercises: Aerobic   Recumbent Bike level 5 x 6 minutes      Knee/Hip Exercises: Machines for Strengthening   Cybex Knee Flexion 35# 2x10                       PT Short Term  Goals - 10/04/21 1141       PT SHORT TERM GOAL #1   Title independent with initial HEP    Status Achieved               PT Long Term Goals - 04/10/22 0847       PT LONG TERM GOAL #1   Title decrease pain with walking 50%    Status Achieved      PT LONG TERM GOAL #2   Title increae left knee extension to 8 degrees from full extension    Status Achieved      PT LONG TERM GOAL #3   Title walkwithout device and without using furniture or walls for balance after sitting    Status Achieved      PT LONG TERM GOAL #4   Title independent with advanced HEP    Status Achieved      PT LONG TERM GOAL #5   Title incresae Berg balance score to 47/56    Status Achieved                   Plan - 04/10/22 0847     Clinical Impression Statement Patient doing well with his ROM, still improving, has the JAS brace at home, he is reporting less balance issues but at times gets  back on his heels, is starting to do some more walking with cooler weather    PT Next Visit Plan D/C goals ,met    Consulted and Agree with Plan of Care Patient             Patient will benefit from skilled therapeutic intervention in order to improve the following deficits and impairments:  Abnormal gait, Decreased range of motion, Difficulty walking, Decreased activity tolerance, Pain, Decreased balance, Impaired flexibility, Decreased strength, Decreased mobility  Visit Diagnosis: Chronic pain of left knee  Difficulty in walking, not elsewhere classified     Problem List Patient Active Problem List   Diagnosis Date Noted   Hereditary hemochromatosis ([place]) 04/10/2017    Sumner Boast, PT 04/10/2022, 9:32 AM  [place]. [place], [place], [address] Phone: [phone]   Fax:  [phone]  Name: Nicholas Mahoney MRN: [id number] Date of Birth: 07-Feb-1946

## 2022-04-14 DIAGNOSIS — Z23 Encounter for immunization: Secondary | ICD-10-CM | POA: Diagnosis not present

## 2022-04-15 DIAGNOSIS — Z85828 Personal history of other malignant neoplasm of skin: Secondary | ICD-10-CM | POA: Diagnosis not present

## 2022-04-15 DIAGNOSIS — L814 Other melanin hyperpigmentation: Secondary | ICD-10-CM | POA: Diagnosis not present

## 2022-04-15 DIAGNOSIS — D225 Melanocytic nevi of trunk: Secondary | ICD-10-CM | POA: Diagnosis not present

## 2022-04-15 DIAGNOSIS — L578 Other skin changes due to chronic exposure to nonionizing radiation: Secondary | ICD-10-CM | POA: Diagnosis not present

## 2022-04-15 DIAGNOSIS — L570 Actinic keratosis: Secondary | ICD-10-CM | POA: Diagnosis not present

## 2022-04-15 DIAGNOSIS — L821 Other seborrheic keratosis: Secondary | ICD-10-CM | POA: Diagnosis not present

## 2022-04-19 DIAGNOSIS — R310 Gross hematuria: Secondary | ICD-10-CM | POA: Diagnosis not present

## 2022-05-09 DIAGNOSIS — R310 Gross hematuria: Secondary | ICD-10-CM | POA: Diagnosis not present

## 2022-05-09 DIAGNOSIS — N401 Enlarged prostate with lower urinary tract symptoms: Secondary | ICD-10-CM | POA: Diagnosis not present

## 2022-05-09 DIAGNOSIS — R3912 Poor urinary stream: Secondary | ICD-10-CM | POA: Diagnosis not present

## 2022-05-15 ENCOUNTER — Other Ambulatory Visit: Payer: Self-pay | Admitting: *Deleted

## 2022-05-21 ENCOUNTER — Inpatient Hospital Stay: Payer: Medicare Other | Attending: Oncology | Admitting: Oncology

## 2022-05-21 ENCOUNTER — Inpatient Hospital Stay: Payer: Medicare Other

## 2022-05-21 DIAGNOSIS — Z5189 Encounter for other specified aftercare: Secondary | ICD-10-CM | POA: Diagnosis not present

## 2022-05-21 DIAGNOSIS — Z8589 Personal history of malignant neoplasm of other organs and systems: Secondary | ICD-10-CM | POA: Diagnosis not present

## 2022-05-21 DIAGNOSIS — H9193 Unspecified hearing loss, bilateral: Secondary | ICD-10-CM | POA: Diagnosis not present

## 2022-05-21 DIAGNOSIS — Z96653 Presence of artificial knee joint, bilateral: Secondary | ICD-10-CM | POA: Insufficient documentation

## 2022-05-21 DIAGNOSIS — N400 Enlarged prostate without lower urinary tract symptoms: Secondary | ICD-10-CM | POA: Diagnosis not present

## 2022-05-21 DIAGNOSIS — C44629 Squamous cell carcinoma of skin of left upper limb, including shoulder: Secondary | ICD-10-CM | POA: Diagnosis not present

## 2022-05-21 LAB — FERRITIN: Ferritin: 18 ng/mL — ABNORMAL LOW (ref 24–336)

## 2022-05-21 NOTE — Progress Notes (Signed)
  [facility] OFFICE PROGRESS NOTE   Diagnosis: Hemochromatosis  INTERVAL HISTORY:   Nicholas. Lepkowski.  He generally feels well.  He walks daily.  He underwent a repeat TUR on 03/19/2022.  Reports hematuria following the procedure.  Urination has improved. Return returned at 52 on 01/21/2022.  Objective:  Vital signs in last 24 hours:  Blood pressure 135/72, pulse 60, temperature 98.2 F (36.8 C), temperature source Oral, resp. rate 20, height '6\' 1"'$  (1.854 m), weight 168 lb (76.2 kg), SpO2 100 %.    Resp: Lungs clear bilaterally Cardio: Rate and rhythm GI: No hepatosplenomegaly Vascular: No leg edema   Lab Results:  Lab Results  Component Value Date   WBC 6.0 09/15/2020   HGB 14.8 09/15/2020   HCT 44.8 09/15/2020   MCV 94.5 09/15/2020   PLT 196 09/15/2020   NEUTROABS 3.5 09/15/2020    CMP  Lab Results  Component Value Date   NA 140 10/19/2018   K 4.5 10/19/2018   CL 106 10/19/2018   CO2 24 10/19/2018   GLUCOSE 84 10/19/2018   BUN 21 10/19/2018   CREATININE 0.79 10/19/2018   CALCIUM 9.2 10/19/2018   PROT 6.9 09/07/2019   ALBUMIN 4.6 09/07/2019   AST 57 (H) 09/07/2019   ALT 32 01/19/2021   ALKPHOS 80 09/07/2019   BILITOT 0.7 09/07/2019   GFRNONAA >60 10/19/2018   GFRAA >60 10/19/2018    Medications: I have reviewed the patient's current medications.   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   BPH, status post TUR x2   3.   History of positional vertigo   4.   Bilateral hearing loss 5.   Knee arthritis-status post bilateral knee replacement     Disposition: Nicholas Mahoney has hereditary hemochromatosis.  He has not required phlebotomy for  several years.  We will follow-up on the ferritin level from today.  He will return for a ferritin level in 4 months and 8 months.  He will be scheduled for a 1 year office visit.  Betsy Coder, MD  05/21/2022  9:26 AM

## 2022-05-22 ENCOUNTER — Other Ambulatory Visit: Payer: Self-pay

## 2022-05-22 ENCOUNTER — Telehealth: Payer: Self-pay

## 2022-05-22 NOTE — Telephone Encounter (Signed)
Patient will be out of the town for two week and he's schedule to come in on 06/10/22 for a cbc

## 2022-05-22 NOTE — Telephone Encounter (Signed)
-----   Message from Ladell Pier, MD sent at 05/21/2022  9:24 PM EST ----- Please call patient, iron level is low, needs to be evaluated for source of blood, order stool hemoccult cards, cbc,  could be related to blood loss from prostate procedures Needs to f/u with primary MD and GI  Schedule cbc and hemoccults next few weeks

## 2022-05-23 DIAGNOSIS — M7130 Other bursal cyst, unspecified site: Secondary | ICD-10-CM | POA: Diagnosis not present

## 2022-06-09 DIAGNOSIS — D500 Iron deficiency anemia secondary to blood loss (chronic): Secondary | ICD-10-CM | POA: Diagnosis not present

## 2022-06-10 ENCOUNTER — Inpatient Hospital Stay: Payer: Medicare Other | Attending: Oncology

## 2022-06-10 DIAGNOSIS — D500 Iron deficiency anemia secondary to blood loss (chronic): Secondary | ICD-10-CM | POA: Diagnosis not present

## 2022-06-11 ENCOUNTER — Other Ambulatory Visit (HOSPITAL_BASED_OUTPATIENT_CLINIC_OR_DEPARTMENT_OTHER): Payer: Self-pay

## 2022-06-11 ENCOUNTER — Other Ambulatory Visit: Payer: Self-pay | Admitting: *Deleted

## 2022-06-11 DIAGNOSIS — D500 Iron deficiency anemia secondary to blood loss (chronic): Secondary | ICD-10-CM | POA: Diagnosis not present

## 2022-06-11 LAB — OCCULT BLOOD X 1 CARD TO LAB, STOOL
Fecal Occult Bld: NEGATIVE
Fecal Occult Bld: NEGATIVE
Fecal Occult Bld: NEGATIVE

## 2022-06-12 ENCOUNTER — Telehealth: Payer: Self-pay

## 2022-06-12 NOTE — Telephone Encounter (Signed)
-----   Message from Ladell Pier, MD sent at 06/11/2022  8:31 PM EST ----- Please call patient, stools card are negative for blood, he needs to f/u with primary MD to look for source of blood loss, repeat  cbc and ferritin with next lab

## 2022-06-12 NOTE — Telephone Encounter (Signed)
Pt gave verbal understanding and had no further questions or concerns

## 2022-08-29 DIAGNOSIS — H903 Sensorineural hearing loss, bilateral: Secondary | ICD-10-CM | POA: Diagnosis not present

## 2022-08-29 DIAGNOSIS — Z570 Occupational exposure to noise: Secondary | ICD-10-CM | POA: Diagnosis not present

## 2022-08-29 DIAGNOSIS — Z822 Family history of deafness and hearing loss: Secondary | ICD-10-CM | POA: Diagnosis not present

## 2022-09-19 ENCOUNTER — Inpatient Hospital Stay: Payer: Medicare Other | Attending: Oncology

## 2022-09-26 DIAGNOSIS — Z23 Encounter for immunization: Secondary | ICD-10-CM | POA: Diagnosis not present

## 2023-01-07 ENCOUNTER — Other Ambulatory Visit: Payer: Self-pay | Admitting: Cardiology

## 2023-02-07 ENCOUNTER — Other Ambulatory Visit: Payer: Medicare Other

## 2023-02-19 DIAGNOSIS — R3912 Poor urinary stream: Secondary | ICD-10-CM | POA: Diagnosis not present

## 2023-02-19 DIAGNOSIS — N5201 Erectile dysfunction due to arterial insufficiency: Secondary | ICD-10-CM | POA: Diagnosis not present

## 2023-02-19 DIAGNOSIS — N401 Enlarged prostate with lower urinary tract symptoms: Secondary | ICD-10-CM | POA: Diagnosis not present

## 2023-02-20 ENCOUNTER — Inpatient Hospital Stay: Payer: Medicare Other | Attending: Oncology

## 2023-02-20 LAB — CBC WITH DIFFERENTIAL (CANCER CENTER ONLY)
Abs Immature Granulocytes: 0.03 10*3/uL (ref 0.00–0.07)
Basophils Absolute: 0.1 10*3/uL (ref 0.0–0.1)
Basophils Relative: 1 %
Eosinophils Absolute: 0.1 10*3/uL (ref 0.0–0.5)
Eosinophils Relative: 2 %
HCT: 45.8 % (ref 39.0–52.0)
Hemoglobin: 15.8 g/dL (ref 13.0–17.0)
Immature Granulocytes: 0 %
Lymphocytes Relative: 51 %
Lymphs Abs: 3.9 10*3/uL (ref 0.7–4.0)
MCH: 32.3 pg (ref 26.0–34.0)
MCHC: 34.5 g/dL (ref 30.0–36.0)
MCV: 93.7 fL (ref 80.0–100.0)
Monocytes Absolute: 0.6 10*3/uL (ref 0.1–1.0)
Monocytes Relative: 8 %
Neutro Abs: 2.9 10*3/uL (ref 1.7–7.7)
Neutrophils Relative %: 38 %
Platelet Count: 196 10*3/uL (ref 150–400)
RBC: 4.89 MIL/uL (ref 4.22–5.81)
RDW: 11.9 % (ref 11.5–15.5)
WBC Count: 7.6 10*3/uL (ref 4.0–10.5)
nRBC: 0 % (ref 0.0–0.2)

## 2023-02-20 LAB — FERRITIN: Ferritin: 32 ng/mL (ref 24–336)

## 2023-02-21 ENCOUNTER — Telehealth: Payer: Self-pay | Admitting: *Deleted

## 2023-02-21 NOTE — Telephone Encounter (Signed)
-----   Message from Thornton Papas sent at 02/20/2023  4:32 PM EDT ----- Please call patient ferritin level is in goal range, follow-up as scheduled

## 2023-02-21 NOTE — Telephone Encounter (Signed)
Notified Mrs. Rardin of ferritin in goal range. Made her aware MD wants to keep him < 100. F/U as scheduled.

## 2023-04-03 DIAGNOSIS — Z23 Encounter for immunization: Secondary | ICD-10-CM | POA: Diagnosis not present

## 2023-04-09 DIAGNOSIS — M170 Bilateral primary osteoarthritis of knee: Secondary | ICD-10-CM | POA: Diagnosis not present

## 2023-04-09 DIAGNOSIS — Z1331 Encounter for screening for depression: Secondary | ICD-10-CM | POA: Diagnosis not present

## 2023-04-09 DIAGNOSIS — Z0000 Encounter for general adult medical examination without abnormal findings: Secondary | ICD-10-CM | POA: Diagnosis not present

## 2023-04-09 DIAGNOSIS — Z6823 Body mass index (BMI) 23.0-23.9, adult: Secondary | ICD-10-CM | POA: Diagnosis not present

## 2023-04-09 DIAGNOSIS — G3184 Mild cognitive impairment, so stated: Secondary | ICD-10-CM | POA: Diagnosis not present

## 2023-04-09 DIAGNOSIS — G479 Sleep disorder, unspecified: Secondary | ICD-10-CM | POA: Diagnosis not present

## 2023-04-09 DIAGNOSIS — R413 Other amnesia: Secondary | ICD-10-CM | POA: Diagnosis not present

## 2023-04-09 DIAGNOSIS — I700 Atherosclerosis of aorta: Secondary | ICD-10-CM | POA: Diagnosis not present

## 2023-04-09 DIAGNOSIS — N400 Enlarged prostate without lower urinary tract symptoms: Secondary | ICD-10-CM | POA: Diagnosis not present

## 2023-04-09 DIAGNOSIS — E785 Hyperlipidemia, unspecified: Secondary | ICD-10-CM | POA: Diagnosis not present

## 2023-04-09 DIAGNOSIS — R030 Elevated blood-pressure reading, without diagnosis of hypertension: Secondary | ICD-10-CM | POA: Diagnosis not present

## 2023-04-09 DIAGNOSIS — H9190 Unspecified hearing loss, unspecified ear: Secondary | ICD-10-CM | POA: Diagnosis not present

## 2023-04-10 ENCOUNTER — Other Ambulatory Visit (HOSPITAL_BASED_OUTPATIENT_CLINIC_OR_DEPARTMENT_OTHER): Payer: Self-pay | Admitting: Family Medicine

## 2023-04-10 DIAGNOSIS — G3184 Mild cognitive impairment, so stated: Secondary | ICD-10-CM

## 2023-04-12 NOTE — Progress Notes (Addendum)
Assessment/Plan:     Nicholas Mahoney is a delightful 77 y.o. year old RH male with a history of hypertension, hyperlipidemia, hereditary hemochromatosis, iron deficiency anemia. BPH, history of BPPV, Bilateral sensorineural hearing loss, arthritis, seen today for evaluation of memory loss.Patient is able to participate on his ADLs and to drive without difficulties. MoCA today is 27/30.  Despite normal performance, there may be decreased retention, which added to decreased hearing and possibly some situational depression, can contribute to memory loss.  Workup is in progress.    Memory Impairment  MRI brain without contrast to assess for underlying structural abnormality and assess vascular load , pending on the results, will consider initiating ACHI .  Neurocognitive testing to further evaluate cognitive concerns and determine other underlying cause of memory changes, including potential contribution from sleep, anxiety, attention, or depression among others  Check B12, TSH Recommend good control of cardiovascular risk factors.   Continue to control mood as per PCP Recommend checking hearing with ENT [place] up in 1 month   Subjective:    The patient is accompanied by his wife who supplements the history.    How long did patient have memory difficulties?  For about 2-3 years "maybe longer"-wife says. Patient reports some difficulty remembering new information, instructions recent conversations, names. "He was always extremely analytical and organized, not so much now"-wife says. 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 "Normal conversation is too fast and he cannot process information" Likes to read, walk the dog.  LTM is good.  repeats oneself?  Endorsed  He has difficulty dealing with time, appts, asking time over and over again. Disoriented when walking into a room? Patient denies    Leaving objects in unusual  places? "Some, such as keys and his phone" not in unusual places   Wandering behavior? Denies.   Any personality changes, or depression, anxiety? Endorsed. "He has frustration over many things, difficulty dealing with tasks that used to be easy". He has an obsession with traffic and weather, handwashing frequently". He has difficulty with instructions. His wife thinks there is some component of depression .  Hallucinations or paranoia? denies   Seizures? denies    Any sleep changes?  Sleeps well . Denies vivid dreams, REM behavior or sleepwalking   Sleep apnea? Denies. Wife says he snores a lot.   Any hygiene concerns?  Denies.   Independent of bathing and dressing? Endorsed  Does the patient need help with medications? Patient is in charge  Who is in charge of the finances? Wife is in charge    Any changes in appetite?   Denies.     Patient have trouble swallowing?  Denies.   Does the patient cook? Not much, only the evening meals, but it is becoming more difficult and forgetting common recipes and to multitask  Any headaches?  Denies.   Chronic pain? Denies.   Ambulates with difficulty? Denies.   Needs a cane when he is on uneven ground for stability.   Recent falls or head injuries? Denies. One year ago in the house, but was mechanical (was trying to put his pants on), no head injury. He has to hold on to the stuff.     Vision changes?  Denies any new issues.  Any strokelike symptoms? Denies.   Any tremors? Denies.   Any anosmia? Endorsed for several years, he thinks that it may have happened after Covid while  in [place].   Any incontinence of urine? Denies Had prostate surgeries in the past, not currently    Any bowel dysfunction? Denies.      Patient lives with wife    History of heavy alcohol intake? Denies.   History of heavy tobacco use? Denies.   Family history of dementia?   A younger brother with possible dementia.  Does patient drive?[place], denies getting lost . He drives long  distances but needs a Engineer, materials for instructions  Retired Tree surgeon of numbers, retired at 20.  Retired Magazine features editor.  No Known Allergies  Current Outpatient Medications  Medication Instructions   aspirin 81 mg, Oral, Daily   Cholecalciferol (VITAMIN D3 PO) 1,000 Units, Oral, Daily   hyoscyamine (LEVSIN SL) 0.125 MG SL tablet DISSOLVE 1 TABLET UNDER TONGUE 4 TIMES A DAY AS NEEDED   rosuvastatin (CRESTOR) 10 mg, Oral, Daily   tamsulosin (FLOMAX) 0.4 MG CAPS capsule 1 capsule Orally as needed   zolpidem (AMBIEN) 10 MG tablet 1 TABLET AT BEDTIME AS NEEDED FOR SLEEP ORALLY     VITALS:   Vitals:   04/15/23 0952 04/15/23 1108  BP: (!) 160/81 (!) 152/80  Pulse: 64   Resp: 20   SpO2: 97%   Height: 6\' 1"  (1.854 m)       09/07/2019   10:48 AM 08/20/2018   11:17 AM  Depression screen PHQ 2/9  Decreased Interest 0 0  Down, Depressed, Hopeless 0 0  PHQ - 2 Score 0 0    PHYSICAL EXAM   HEENT:  Normocephalic, atraumatic.  The superficial temporal arteries are without ropiness or tenderness. Cardiovascular: Regular rate and rhythm. Lungs: Clear to auscultation bilaterally. Neck: There are no carotid bruits noted bilaterally.  NEUROLOGICAL:    04/15/2023   12:00 PM 05/08/2020   11:00 AM  Montreal Cognitive Assessment   Visuospatial/ Executive (0/5) 5 4  Naming (0/3) 3 3  Attention: Read list of digits (0/2) 2 2  Attention: Read list of letters (0/1) 1 1  Attention: Serial 7 subtraction starting at 100 (0/3) 3 3  Language: Repeat phrase (0/2) 2 2  Language : Fluency (0/1) 1 1  Abstraction (0/2) 2 2  Delayed Recall (0/5) 2 1  Orientation (0/6) 6 6  Total 27 25  Adjusted Score (based on education) 27 25        No data to display           Orientation:  Alert and oriented to person, place and not to time. No aphasia or dysarthria. Fund of knowledge is appropriate. Recent memory impaired, remote memory normal. Attention and concentration are reduced.  Able  to name objects and repeat phrases 2/2  . Delayed recall 2 / 5  Cranial nerves: There is good facial symmetry. Extraocular muscles are intact and visual fields are full to confrontational testing. Speech is fluent and clear. No tongue deviation. Hearing is reduced to conversational tone.  Tone: Tone is good throughout. Sensation: Sensation is intact to light touch.  Vibration is intact at the bilateral big toe.  Coordination: The patient has no difficulty with RAM's or FNF bilaterally. Normal finger to nose  Motor: Strength is 5/5 in the bilateral upper and lower extremities. There is no pronator drift. There are no fasciculations noted. DTR's: Deep tendon reflexes are 2/4 bilaterally. Gait and Station: The patient is able to ambulate without significant difficulty. Gait is cautious and narrow. Cannot do toe walk toe. Stride length is short  Thank you for allowing [place] the opportunity to participate in the care of this nice patient. Please do not hesitate to contact us for any questions or concerns.   Total time spent on today's visit was 63 minutes dedicated to this patient today, preparing to see patient, examining the patient, ordering tests and/or medications and counseling the patient, documenting clinical information in the EHR or other health record, independently interpreting results and communicating results to the patient/family, discussing treatment and goals, answering patient's questions and coordinating care.  Cc:  Gweneth Dimitri, MD  Marlowe Kays 04/15/2023 12:21 PM

## 2023-04-15 ENCOUNTER — Ambulatory Visit (INDEPENDENT_AMBULATORY_CARE_PROVIDER_SITE_OTHER): Payer: Medicare Other | Admitting: Physician Assistant

## 2023-04-15 ENCOUNTER — Other Ambulatory Visit (INDEPENDENT_AMBULATORY_CARE_PROVIDER_SITE_OTHER): Payer: Medicare Other

## 2023-04-15 ENCOUNTER — Encounter: Payer: Self-pay | Admitting: Physician Assistant

## 2023-04-15 VITALS — BP 152/80 | HR 64 | Resp 20 | Ht 73.0 in

## 2023-04-15 DIAGNOSIS — R413 Other amnesia: Secondary | ICD-10-CM | POA: Insufficient documentation

## 2023-04-15 LAB — TSH: TSH: 1.6 u[IU]/mL (ref 0.35–5.50)

## 2023-04-15 LAB — VITAMIN B12: Vitamin B-12: 196 pg/mL — ABNORMAL LOW (ref 211–911)

## 2023-04-15 NOTE — Progress Notes (Signed)
B12 is very low at 196, needs to take B12  1000 mcg daily and follow with PCP The thyroid levels are normal Thanks

## 2023-04-15 NOTE — Patient Instructions (Addendum)

## 2023-05-08 ENCOUNTER — Encounter: Payer: Self-pay | Admitting: Oncology

## 2023-05-08 ENCOUNTER — Ambulatory Visit
Admission: RE | Admit: 2023-05-08 | Discharge: 2023-05-08 | Disposition: A | Discharge status: Home / Self Care | Payer: Medicare Other | Source: Ambulatory Visit | Attending: Physician Assistant | Admitting: Physician Assistant

## 2023-05-08 DIAGNOSIS — G319 Degenerative disease of nervous system, unspecified: Secondary | ICD-10-CM | POA: Diagnosis not present

## 2023-05-08 DIAGNOSIS — G3184 Mild cognitive impairment, so stated: Secondary | ICD-10-CM | POA: Diagnosis not present

## 2023-05-08 DIAGNOSIS — Z6823 Body mass index (BMI) 23.0-23.9, adult: Secondary | ICD-10-CM | POA: Diagnosis not present

## 2023-05-08 DIAGNOSIS — R262 Difficulty in walking, not elsewhere classified: Secondary | ICD-10-CM | POA: Diagnosis not present

## 2023-05-08 DIAGNOSIS — R29818 Other symptoms and signs involving the nervous system: Secondary | ICD-10-CM | POA: Diagnosis not present

## 2023-05-08 DIAGNOSIS — R410 Disorientation, unspecified: Secondary | ICD-10-CM | POA: Diagnosis not present

## 2023-05-08 DIAGNOSIS — I6782 Cerebral ischemia: Secondary | ICD-10-CM | POA: Diagnosis not present

## 2023-05-08 DIAGNOSIS — F4320 Adjustment disorder, unspecified: Secondary | ICD-10-CM | POA: Diagnosis not present

## 2023-05-14 DIAGNOSIS — D044 Carcinoma in situ of skin of scalp and neck: Secondary | ICD-10-CM | POA: Diagnosis not present

## 2023-05-14 DIAGNOSIS — D485 Neoplasm of uncertain behavior of skin: Secondary | ICD-10-CM | POA: Diagnosis not present

## 2023-05-14 DIAGNOSIS — L820 Inflamed seborrheic keratosis: Secondary | ICD-10-CM | POA: Diagnosis not present

## 2023-05-22 ENCOUNTER — Other Ambulatory Visit: Payer: Medicare Other

## 2023-05-22 ENCOUNTER — Ambulatory Visit: Payer: Medicare Other | Admitting: Oncology

## 2023-05-26 ENCOUNTER — Other Ambulatory Visit: Payer: Self-pay | Admitting: *Deleted

## 2023-06-02 ENCOUNTER — Inpatient Hospital Stay: Payer: Medicare Other | Attending: Oncology

## 2023-06-02 ENCOUNTER — Inpatient Hospital Stay (HOSPITAL_BASED_OUTPATIENT_CLINIC_OR_DEPARTMENT_OTHER): Payer: Medicare Other | Admitting: Oncology

## 2023-06-02 DIAGNOSIS — Z96653 Presence of artificial knee joint, bilateral: Secondary | ICD-10-CM | POA: Diagnosis not present

## 2023-06-02 DIAGNOSIS — R319 Hematuria, unspecified: Secondary | ICD-10-CM | POA: Insufficient documentation

## 2023-06-02 DIAGNOSIS — H9193 Unspecified hearing loss, bilateral: Secondary | ICD-10-CM | POA: Insufficient documentation

## 2023-06-02 DIAGNOSIS — N400 Enlarged prostate without lower urinary tract symptoms: Secondary | ICD-10-CM | POA: Diagnosis not present

## 2023-06-02 LAB — CBC WITH DIFFERENTIAL (CANCER CENTER ONLY)
Abs Immature Granulocytes: 0.02 10*3/uL (ref 0.00–0.07)
Basophils Absolute: 0.1 10*3/uL (ref 0.0–0.1)
Basophils Relative: 1 %
Eosinophils Absolute: 0 10*3/uL (ref 0.0–0.5)
Eosinophils Relative: 0 %
HCT: 45.5 % (ref 39.0–52.0)
Hemoglobin: 15.3 g/dL (ref 13.0–17.0)
Immature Granulocytes: 0 %
Lymphocytes Relative: 41 %
Lymphs Abs: 4.1 10*3/uL — ABNORMAL HIGH (ref 0.7–4.0)
MCH: 32.2 pg (ref 26.0–34.0)
MCHC: 33.6 g/dL (ref 30.0–36.0)
MCV: 95.8 fL (ref 80.0–100.0)
Monocytes Absolute: 0.7 10*3/uL (ref 0.1–1.0)
Monocytes Relative: 7 %
Neutro Abs: 5 10*3/uL (ref 1.7–7.7)
Neutrophils Relative %: 51 %
Platelet Count: 172 10*3/uL (ref 150–400)
RBC: 4.75 MIL/uL (ref 4.22–5.81)
RDW: 12 % (ref 11.5–15.5)
WBC Count: 9.9 10*3/uL (ref 4.0–10.5)
nRBC: 0 % (ref 0.0–0.2)

## 2023-06-02 LAB — FERRITIN: Ferritin: 49 ng/mL (ref 24–336)

## 2023-06-02 NOTE — Progress Notes (Signed)
  [facility] OFFICE PROGRESS NOTE   Diagnosis: Hemochromatosis  INTERVAL HISTORY:   Mr.Critzer returns as scheduled.  He reports chronic intermittent hematuria.  He has been evaluated by Dr. Mena Goes including an abdominal MRI to evaluate kidney cyst.  He has balance difficulty.  He is seeing neurology.  He has chronic knee pain.  Objective:  Vital signs in last 24 hours:  Blood pressure 135/77, pulse 71, temperature 98.2 F (36.8 C), temperature source Temporal, resp. rate 18, height 6\' 1"  (1.854 m), weight 176 lb 1.6 oz (79.9 kg), SpO2 98%.    Resp: Lungs clear bilaterally Cardio: Regular rhythm with premature beats GI: No hepatosplenomegaly Vascular: Trace edema at the right lower leg    Lab Results:  Lab Results  Component Value Date   WBC 9.9 06/02/2023   HGB 15.3 06/02/2023   HCT 45.5 06/02/2023   MCV 95.8 06/02/2023   PLT 172 06/02/2023   NEUTROABS 5.0 06/02/2023    CMP  Lab Results  Component Value Date   NA 140 10/19/2018   K 4.5 10/19/2018   CL 106 10/19/2018   CO2 24 10/19/2018   GLUCOSE 84 10/19/2018   BUN 21 10/19/2018   CREATININE 0.79 10/19/2018   CALCIUM 9.2 10/19/2018   PROT 6.9 09/07/2019   ALBUMIN 4.6 09/07/2019   AST 57 (H) 09/07/2019   ALT 32 01/19/2021   ALKPHOS 80 09/07/2019   BILITOT 0.7 09/07/2019   GFRNONAA >60 10/19/2018   GFRAA >60 10/19/2018     Medications: I have reviewed the patient's current medications.   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   BPH, status post TUR x2   3.   History of positional vertigo   4.   Bilateral hearing loss 5.   Knee arthritis-status post bilateral knee replacement 6.    Low ferritin 05/21/2022, negative stool Hemoccults      Disposition: Mr Pippenger appears stable.  The ferritin is in goal range.  The low iron level last year may have been related to GU blood loss.  He is followed by Dr. Mena Goes for BPH and hematuria.  He will return for a ferritin level in 6 months and an office visit in 1 year. Thornton Papas, MD  06/02/2023  10:02 AM

## 2023-06-03 ENCOUNTER — Ambulatory Visit (INDEPENDENT_AMBULATORY_CARE_PROVIDER_SITE_OTHER): Payer: Medicare Other | Admitting: Physician Assistant

## 2023-06-03 ENCOUNTER — Encounter: Payer: Self-pay | Admitting: Physician Assistant

## 2023-06-03 VITALS — BP 137/70 | HR 75 | Resp 20 | Ht 73.0 in | Wt 176.0 lb

## 2023-06-03 DIAGNOSIS — R413 Other amnesia: Secondary | ICD-10-CM | POA: Diagnosis not present

## 2023-06-03 MED ORDER — DONEPEZIL HCL 10 MG PO TABS: ORAL_TABLET | ORAL | 11 refills | Status: DC

## 2023-06-03 NOTE — Progress Notes (Addendum)
Assessment/Plan:   Memory impairment  Nicholas Mahoney is a delightful  77 y.o. RH male hypertension, hyperlipidemia, hereditary hemochromatosis, iron deficiency anemia. BPH, history of BPPV, Bilateral sensorineural hearing loss, arthritis seen today in follow up to discuss the MRI of the brain results performed on 05/08/23. 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 Additionally, there are foci of chronic hemosiderin deposition scattered along the bilateral cerebral hemispheres consistent with sequelae of remote subarachnoid hemorrhage, chronic microhemorrhages also noted within the cerebellar vermis and right cerebellar hemisphere, background mild chronic small vessel ischemic changes within the cerebral white matter and mild generalized cerebral atrophy. He denies any severe headaches or seizures, but wife reports that his memory may be worse. Based on the findings, discussed initiating ACHI and its side effects patient agreed to proceed.       Follow up in 6  months. Start donepezil 10 mg daily, side effects discussed Patient is scheduled for Neuropsych testing for clarity of diagnosis in the near future Continue to control mood as per PCP Recommend good control of cardiovascular risk factors  Initial visit 04/18/23   How long did patient have memory difficulties?  For about 2-3 years "maybe longer"-wife says. Patient reports some difficulty remembering new information, instructions recent conversations, names. "He was always extremely analytical and organized, not so much now"-wife says. 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 "Normal conversation is too fast and he cannot process information" Likes to read, walk the dog.  LTM is good.  repeats oneself?  Endorsed   He has difficulty dealing with time, appts, asking time over and over again. Disoriented when walking into a room? Patient denies    Leaving objects in unusual places? "Some, such as keys and his phone" not in unusual places   Wandering behavior? Denies.   Any personality changes, or depression, anxiety? Endorsed. "He has frustration over many things, difficulty dealing with tasks that used to be easy". He has an obsession with traffic and weather, handwashing frequently". He has difficulty with instructions. His wife thinks there is some component of depression .  Hallucinations or paranoia? denies   Seizures? denies    Any sleep changes?  Sleeps well . Denies vivid dreams, REM behavior or sleepwalking   Sleep apnea? Denies. Wife says he snores a lot.   Any hygiene concerns?  Denies.   Independent of bathing and dressing? Endorsed  Does the patient need help with medications? Patient is in charge  Who is in charge of the finances? Wife is in charge    Any changes in appetite?   Denies.     Patient have trouble swallowing?  Denies.   Does the patient cook? Not much, only the evening meals, but it is becoming more difficult and forgetting common recipes and to multitask  Any headaches?  Denies.   Chronic pain? Denies.   Ambulates with difficulty? Denies.   Needs a cane when he is on uneven ground for stability.   Recent falls or head injuries? Denies. One year ago in the house, but was mechanical (was trying to put his pants on), no head injury. He has to hold on to the stuff.     Vision changes?  Denies any new issues.  Any strokelike symptoms? Denies.   Any tremors? Denies.   Any anosmia? Endorsed for several  years, he thinks that it may have happened after Covid while in [place].   Any incontinence of urine? Denies Had prostate surgeries in the past, not currently    Any bowel dysfunction? Denies.      Patient lives with wife    History of heavy alcohol intake? Denies.   History of heavy  tobacco use? Denies.   Family history of dementia?   A younger brother with possible dementia.  Does patient drive?Rosita Fire, denies getting lost . 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  Retired Magazine features editor.  CURRENT MEDICATIONS:  Outpatient Encounter Medications as of 06/03/2023  Medication Sig   aspirin 81 MG tablet Take 1 tablet (81 mg total) by mouth daily.   Cholecalciferol (VITAMIN D3 PO) Take 1,000 Units by mouth daily.   Cyanocobalamin 250 MCG LOZG Take 250 mcg by mouth daily.   hyoscyamine (LEVSIN SL) 0.125 MG SL tablet DISSOLVE 1 TABLET UNDER TONGUE 4 TIMES A DAY AS NEEDED (Patient not taking: Reported on 05/21/2022)   rosuvastatin (CRESTOR) 10 MG tablet TAKE 1 TABLET BY MOUTH EVERY DAY   tamsulosin (FLOMAX) 0.4 MG CAPS capsule 1 capsule Orally as needed (Patient not taking: Reported on 04/15/2023)   zolpidem (AMBIEN) 10 MG tablet 1 TABLET AT BEDTIME AS NEEDED FOR SLEEP ORALLY (Patient not taking: Reported on 05/21/2022)   No facility-administered encounter medications on file as of 06/03/2023.        No data to display            04/15/2023   12:00 PM 05/08/2020   11:00 AM  Montreal Cognitive Assessment   Visuospatial/ Executive (0/5) 5 4  Naming (0/3) 3 3  Attention: Read list of digits (0/2) 2 2  Attention: Read list of letters (0/1) 1 1  Attention: Serial 7 subtraction starting at 100 (0/3) 3 3  Language: Repeat phrase (0/2) 2 2  Language : Fluency (0/1) 1 1  Abstraction (0/2) 2 2  Delayed Recall (0/5) 2 1  Orientation (0/6) 6 6  Total 27 25  Adjusted Score (based on education) 27 25   Thank you for allowing [place] the opportunity to participate in the care of this nice patient. Please do not hesitate to contact us for any questions or concerns.   Total time spent on today's visit was 46 minutes dedicated to this patient today, preparing to see patient, examining the patient,  ordering tests and/or medications and counseling the patient, documenting clinical information in the EHR or other health record, independently interpreting results and communicating results to the patient/family, discussing treatment and goals, answering patient's questions and coordinating care.  Cc:  Gweneth Dimitri, MD  Marlowe Kays 06/03/2023 6:03 AM

## 2023-06-03 NOTE — Patient Instructions (Addendum)
It was a pleasure to see you today at our office.   Recommendations:  Neurocognitive evaluation at our office   We will start donepezil half tablet (5mg ) daily for 2  weeks.  If you are tolerating the medication, then after 2 weeks, we will increase the dose to a full tablet of 10 mg daily.  Side effects include  diarrhea, vivid dreams, and muscle cramps.   Follow up June 17 at 11:30    For psychiatric meds, mood meds: Please have your primary care physician manage these medications.  If you have any severe symptoms of a stroke, or other severe issues such as confusion,severe chills or fever, etc call 911 or go to the ER as you may need to be evaluated further     For assessment of decision of mental capacity and competency:  Call Dr. Erick Blinks, geriatric psychiatrist at [phone]  Counseling regarding caregiver distress, including caregiver depression, anxiety and issues regarding community resources, adult day care programs, adult living facilities, or memory care questions:  please contact your  Primary Doctor's Social Worker   Whom to call: Memory  decline, memory medications: Call our office [phone]    [email or web address]   RECOMMENDATIONS FOR ALL PATIENTS WITH MEMORY PROBLEMS: 1. Continue to exercise (Recommend 30 minutes of walking everyday, or 3 hours every week) 2. Increase social interactions - continue going to [place] and enjoy social gatherings with friends and family 3. Eat healthy, avoid fried foods and eat more fruits and vegetables 4. Maintain adequate blood pressure, blood sugar, and blood cholesterol level. Reducing the risk of stroke and cardiovascular disease also helps promoting better memory. 5. Avoid stressful situations. Live a simple life and avoid aggravations. Organize your time and prepare for the next day in anticipation. 6. Sleep well, avoid any interruptions of sleep and avoid any  distractions in the bedroom that may interfere with adequate sleep quality 7. Avoid sugar, avoid sweets as there is a strong link between excessive sugar intake, diabetes, and cognitive impairment We discussed the Mediterranean diet, which has been shown to help patients reduce the risk of progressive memory disorders and reduces cardiovascular risk. This includes eating fish, eat fruits and green leafy vegetables, nuts like almonds and hazelnuts, walnuts, and also use olive oil. Avoid fast foods and fried foods as much as possible. Avoid sweets and sugar as sugar use has been linked to worsening of memory function.  There is always a concern of gradual progression of memory problems. If this is the case, then we may need to adjust level of care according to patient needs. Support, both to the patient and caregiver, should then be put into place.      You have been referred for a neuropsychological evaluation (i.e., evaluation of memory and thinking abilities). Please bring someone with you to this appointment if possible, as it is helpful for the doctor to hear from both you and another adult who knows you well. Please bring eyeglasses and hearing aids if you wear them.    The evaluation will take approximately 3 hours and has two parts:   The first part is a clinical interview with the neuropsychologist (Dr. Milbert Coulter or Dr. Roseanne Reno). During the interview, the neuropsychologist will speak with you and the individual you brought to the appointment.    The second part of the evaluation is testing with the doctor's technician Annabelle Harman or Selena Batten). During the testing, the technician will ask you to remember different types of material, solve problems, and  answer some questionnaires. Your family member will not be present for this portion of the evaluation.   Please note: We must reserve several hours of the neuropsychologist's time and the psychometrician's time for your evaluation appointment. As such, there is  a No-Show fee of $100. If you are unable to attend any of your appointments, please contact our office as soon as possible to reschedule.      DRIVING: Regarding driving, in patients with progressive memory problems, driving will be impaired. We advise to have someone else do the driving if trouble finding directions or if minor accidents are reported. Independent driving assessment is available to determine safety of driving.   If you are interested in the driving assessment, you can contact the following:  [facility] in [place] [phone]  [facility] [phone]  [facility] [phone]  [facility] [phone] or [phone]   FALL PRECAUTIONS: Be cautious when walking. Scan the area for obstacles that may increase the risk of trips and falls. When getting up in the mornings, sit up at the edge of the bed for a few minutes before getting out of bed. Consider elevating the bed at the head end to avoid drop of blood pressure when getting up. Walk always in a well-lit room (use night lights in the walls). Avoid area rugs or power cords from appliances in the middle of the walkways. Use a walker or a cane if necessary and consider physical therapy for balance exercise. Get your eyesight checked regularly.  FINANCIAL OVERSIGHT: Supervision, especially oversight when making financial decisions or transactions is also recommended.  HOME SAFETY: Consider the safety of the kitchen when operating appliances like stoves, microwave oven, and blender. Consider having supervision and share cooking responsibilities until no longer able to participate in those. Accidents with firearms and other hazards in the house should be identified and addressed as well.   ABILITY TO BE LEFT ALONE: If patient is unable to contact 911 operator, consider using LifeLine, or when the need is there, arrange for someone to stay with patients. Smoking is a fire  hazard, consider supervision or cessation. Risk of wandering should be assessed by caregiver and if detected at any point, supervision and safe proof recommendations should be instituted.  MEDICATION SUPERVISION: Inability to self-administer medication needs to be constantly addressed. Implement a mechanism to ensure safe administration of the medications.      Mediterranean Diet A Mediterranean diet refers to food and lifestyle choices that are based on the traditions of countries located on the [employer] [place]. This way of eating has been shown to help prevent certain conditions and improve outcomes for people who have chronic diseases, like kidney disease and heart disease. What are tips for following this plan? Lifestyle  Cook and eat meals together with your family, when possible. Drink enough fluid to keep your urine clear or pale yellow. Be physically active every day. This includes: Aerobic exercise like running or swimming. Leisure activities like gardening, walking, or housework. Get 7-8 hours of sleep each night. If recommended by your health care provider, drink red wine in moderation. This means 1 glass a day for nonpregnant women and 2 glasses a day for men. A glass of wine equals 5 oz (150 mL). Reading food labels  Check the serving size of packaged foods. For foods such as rice and pasta, the serving size refers to the amount of cooked product, not dry. Check the total fat in packaged foods. Avoid foods that  have saturated fat or trans fats. Check the ingredients list for added sugars, such as corn syrup. Shopping  At the grocery store, buy most of your food from the areas near the walls of the store. This includes: Fresh fruits and vegetables (produce). Grains, beans, nuts, and seeds. Some of these may be available in unpackaged forms or large amounts (in bulk). Fresh seafood. Poultry and eggs. Low-fat dairy products. Buy whole ingredients instead of prepackaged  foods. Buy fresh fruits and vegetables in-season from local farmers markets. Buy frozen fruits and vegetables in resealable bags. If you do not have access to quality fresh seafood, buy precooked frozen shrimp or canned fish, such as tuna, salmon, or sardines. Buy small amounts of raw or cooked vegetables, salads, or olives from the deli or salad bar at your store. Stock your pantry so you always have certain foods on hand, such as olive oil, canned tuna, canned tomatoes, rice, pasta, and beans. Cooking  Cook foods with extra-virgin olive oil instead of using butter or other vegetable oils. Have meat as a side dish, and have vegetables or grains as your main dish. This means having meat in small portions or adding small amounts of meat to foods like pasta or stew. Use beans or vegetables instead of meat in common dishes like chili or lasagna. Experiment with different cooking methods. Try roasting or broiling vegetables instead of steaming or sauteing them. Add frozen vegetables to soups, stews, pasta, or rice. Add nuts or seeds for added healthy fat at each meal. You can add these to yogurt, salads, or vegetable dishes. Marinate fish or vegetables using olive oil, lemon juice, garlic, and fresh herbs. Meal planning  Plan to eat 1 vegetarian meal one day each week. Try to work up to 2 vegetarian meals, if possible. Eat seafood 2 or more times a week. Have healthy snacks readily available, such as: Vegetable sticks with hummus. Greek yogurt. Fruit and nut trail mix. Eat balanced meals throughout the week. This includes: Fruit: 2-3 servings a day Vegetables: 4-5 servings a day Low-fat dairy: 2 servings a day Fish, poultry, or lean meat: 1 serving a day Beans and legumes: 2 or more servings a week Nuts and seeds: 1-2 servings a day Whole grains: 6-8 servings a day Extra-virgin olive oil: 3-4 servings a day Limit red meat and sweets to only a few servings a month What are my food  choices? Mediterranean diet Recommended Grains: Whole-grain pasta. Brown rice. Bulgar wheat. Polenta. Couscous. Whole-wheat bread. Orpah Cobb. Vegetables: Artichokes. Beets. Broccoli. Cabbage. Carrots. Eggplant. Green beans. Chard. Kale. Spinach. Onions. Leeks. Peas. Squash. Tomatoes. Peppers. Radishes. Fruits: Apples. Apricots. Avocado. Berries. Bananas. Cherries. Dates. Figs. Grapes. Lemons. Melon. Oranges. Peaches. Plums. Pomegranate. Meats and other protein foods: Beans. Almonds. Sunflower seeds. Pine nuts. Peanuts. Cod. Salmon. Scallops. Shrimp. Tuna. Tilapia. Clams. Oysters. Eggs. Dairy: Low-fat milk. Cheese. Greek yogurt. Beverages: Water. Red wine. Herbal tea. Fats and oils: Extra virgin olive oil. Avocado oil. Grape seed oil. Sweets and desserts: [place] yogurt with honey. Baked apples. Poached pears. Trail mix. Seasoning and other foods: Basil. Cilantro. Coriander. Cumin. Mint. Parsley. Sage. Rosemary. Tarragon. Garlic. Oregano. Thyme. Pepper. Balsalmic vinegar. Tahini. Hummus. Tomato sauce. Olives. Mushrooms. Limit these Grains: Prepackaged pasta or rice dishes. Prepackaged cereal with added sugar. Vegetables: Deep fried potatoes (french fries). Fruits: Fruit canned in syrup. Meats and other protein foods: Beef. Pork. Lamb. Poultry with skin. Hot dogs. Tomasa Blase. Dairy: Ice cream. Sour cream. Whole milk. Beverages: Juice. Sugar-sweetened soft drinks. Beer.  Liquor and spirits. Fats and oils: Butter. Canola oil. Vegetable oil. Beef fat (tallow). Lard. Sweets and desserts: Cookies. Cakes. Pies. Candy. Seasoning and other foods: Mayonnaise. Premade sauces and marinades. The items listed may not be a complete list. Talk with your dietitian about what dietary choices are right for you. Summary The Mediterranean diet includes both food and lifestyle choices. Eat a variety of fresh fruits and vegetables, beans, nuts, seeds, and whole grains. Limit the amount of red meat and sweets that  you eat. Talk with your health care provider about whether it is safe for you to drink red wine in moderation. This means 1 glass a day for nonpregnant women and 2 glasses a day for men. A glass of wine equals 5 oz (150 mL). This information is not intended to replace advice given to you by your health care provider. Make sure you discuss any questions you have with your health care provider. Document Released: 02/15/2016 Document Revised: 03/19/2016 Document Reviewed: 02/15/2016 Elsevier Interactive Patient Education  2017 [employer].

## 2023-06-03 NOTE — Addendum Note (Signed)
Addended by: Marlowe Kays E on: 06/03/2023 05:18 PM   Modules accepted: Level of Service

## 2023-06-09 NOTE — Therapy (Signed)
OUTPATIENT PHYSICAL THERAPY THORACOLUMBAR EVALUATION   Patient Name: Nicholas Mahoney MRN: [id number] DOB:1945/10/21, 77 y.o., male Today's Date: 06/10/2023  END OF SESSION:  PT End of Session - 06/10/23 1358     Visit Number 1    Date for PT Re-Evaluation 08/11/23    Authorization Type Medicare    PT Start Time 1355    PT Stop Time 1440    PT Time Calculation (min) 45 min    Activity Tolerance Patient tolerated treatment well    Behavior During Therapy [facility] for tasks assessed/performed             Past Medical History:  Diagnosis Date   Bladder outlet obstruction    BPH (benign prostatic hyperplasia)    ED (erectile dysfunction)    Hereditary hemochromatosis (HCC)    followed by dr Vicie Mutters 05-21-2021, no recent theraputic phlebotomy needed   History of COVID-19 12/2020   asymptomatic   Hyperlipidemia    Lower urinary tract symptoms (LUTS)    OA (osteoarthritis) of knee    Vitamin D deficiency    Wears glasses    Wears hearing aid    bilateral   Past Surgical History:  Procedure Laterality Date   APPENDECTOMY  1980's   CARDIAC CATHETERIZATION     abnormal myoview/  no sig. cad,  normal LVF,  ef 60%,  false positive myoview 01-26-2008  dr Anne Fu   GREEN LIGHT LASER TURP (TRANSURETHRAL RESECTION OF PROSTATE N/A 06/13/2015   Procedure: GREEN LIGHT LASER TURP (TRANSURETHRAL RESECTION OF PROSTATE;  Surgeon: Jerilee Field, MD;  Location: [facility];  Service: Urology;  Laterality: N/A;   HERNIA REPAIR  infant   KNEE ARTHROSCOPY W/ MENISCECTOMY Bilateral left  10-04-2008/  right 11-09-2007   and chondraplasty/  synovectomy   TRANSURETHRAL RESECTION OF PROSTATE N/A 05/18/2019   Procedure: TRANSURETHRAL RESECTION OF THE PROSTATE (TURP);  Surgeon: Jerilee Field, MD;  Location: [facility];  Service: Urology;  Laterality: N/A;   TRANSURETHRAL RESECTION OF PROSTATE N/A 03/19/2022   Procedure: CYSTOSCOPY/ TRANSURETHRAL RESECTION OF  THE PROSTATE (TURP) RESIDUAL;  Surgeon: Jerilee Field, MD;  Location: [facility];  Service: Urology;  Laterality: N/A;   VASECTOMY  1980's   Patient Active Problem List   Diagnosis Date Noted   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   Hereditary hemochromatosis (HCC) 04/10/2017    PCP: Gweneth Dimitri, MD  REFERRING PROVIDER: Corliss Blacker, MD  REFERRING DIAG: poor balance  Rationale for Evaluation and Treatment: Rehabilitation  THERAPY DIAG:  Muscle weakness (generalized)  Difficulty in walking, not elsewhere classified  Unsteadiness on feet  ONSET DATE: 05/08/23  SUBJECTIVE:  SUBJECTIVE STATEMENT: Patient reports that he had a pretty good travel year, he reports that he feels like he is more unsteady on his feet, he tends to fall behind with walking with groups he also reports more and more unsteady gait, denies any recent falls.    PERTINENT HISTORY:  Bilateral TKA  PAIN:  Are you having pain? Yes: NPRS scale: 2/10 Pain location: left knee pain Pain description: tired, and ache Aggravating factors: walking without cane, steps Relieving factors: using cane helps, rest  PRECAUTIONS: Fall  RED FLAGS: None   WEIGHT BEARING RESTRICTIONS: No  FALLS:  Has patient fallen in last 6 months? No and reports stumbles and near falls  LIVING ENVIRONMENT: Lives with: lives with their family Lives in: House/apartment Stairs: Yes: Internal: 12 steps; can reach both Has following equipment at home: Single point cane  OCCUPATION: retired Psychologist, occupational  PLOF: Independent and has started using a cane for most walking  PATIENT GOALS: walking faster, feeling more stable, possibly walk longer less use of the cane  NEXT MD VISIT: 6 months  OBJECTIVE:  Note: Objective measures were completed at  Evaluation unless otherwise noted.  DIAGNOSTIC FINDINGS:  none COGNITION: Overall cognitive status: Within functional limits for tasks assessed     SENSATION: Lower legs are cool to the touch  MUSCLE LENGTH: Very tight HS, calves, ITB, piriformis  POSTURE: rounded shoulders, forward head, and decreased lumbar lordosis  PALPATION: LE's lower leg are cool to touch  LUMBAR ROM:   AROM eval  Flexion Decreased 25%  Extension Decreased 100%  Right lateral flexion Decreased 50%  Left lateral flexion Decreased 50%  Right rotation   Left rotation    (Blank rows = not tested)  LOWER EXTREMITY ROM:     Active  Right eval Left eval  Hip flexion    Hip extension    Hip abduction    Hip adduction    Hip internal rotation    Hip external rotation    Knee flexion    Knee extension 4 12  Ankle dorsiflexion    Ankle plantarflexion    Ankle inversion    Ankle eversion     (Blank rows = not tested)  LOWER EXTREMITY MMT:    MMT Right eval Left eval  Hip flexion 4 4  Hip extension 4 4  Hip abduction 4 4  Hip adduction    Hip internal rotation    Hip external rotation    Knee flexion 4 4  Knee extension 4- 4-  Ankle dorsiflexion    Ankle plantarflexion    Ankle inversion    Ankle eversion     (Blank rows = not tested)  FUNCTIONAL TESTS:  BERG 14/56, was 47/56 In September 2023 he scored 47/56   TUG 17 seconds, needs hands to get up, no device uncontrolled sit   3 minute walk test 500 feet no device, occasionally would use wall or  Furniture to balance GAIT: Distance walked: see above Assistive device utilized: Single point cane and None Level of assistance: SBA Comments: very unsteady, but if he is moving he does better, currently needs a cane, if he is standing without walking he has to be in motion to find his balance and is always fighting his balance  TODAY'S TREATMENT:  DATE:     PATIENT EDUCATION:  Education details: POC/HEP Person educated: Patient Education method: Explanation Education comprehension: verbalized understanding  HOME EXERCISE PROGRAM: Access Code: [email or web address] URL: [email or web address] Date: 06/10/2023 Prepared by: Stacie Glaze  Exercises - Standing Marching  - 1 x daily - 7 x weekly - 2 sets - 10 reps - 3 hold - Standing Hip Abduction  - 1 x daily - 7 x weekly - 2 sets - 10 reps - 3 hold - Standing Hip Extension with Chair  - 1 x daily - 7 x weekly - 2 sets - 10 reps - 3 hold  ASSESSMENT:  CLINICAL IMPRESSION: Patient is a 77 y.o. male who was seen today for physical therapy evaluation and treatment for balance issues, and unsteady gait.  He scored a 14/56 on the BERG and last year in September this score was 47/56. He really struggles to stand, tends to constantly be moving and adjusting his balance.  If he is moving he does well, if he is touching something like the wall or has hand on furniture he does well.  He denies neuropathy but does have cold lower legs  OBJECTIVE IMPAIRMENTS: Abnormal gait, cardiopulmonary status limiting activity, decreased activity tolerance, decreased balance, decreased coordination, decreased endurance, decreased mobility, difficulty walking, decreased ROM, decreased strength, increased fascial restrictions, impaired perceived functional ability, increased muscle spasms, impaired flexibility, improper body mechanics, postural dysfunction, and pain.   REHAB POTENTIAL: Good  CLINICAL DECISION MAKING: Stable/uncomplicated  EVALUATION COMPLEXITY: Low   GOALS: Goals reviewed with patient? Yes  SHORT TERM GOALS: Target date: 07/10/23  Independent with initial HEP Goal status: INITIAL  LONG TERM GOALS: Target date: 09/07/23  Independent with advanced HEP Goal status: INITIAL  2.  Increase BERG balance test score to 40/56 Goal status: INITIAL  3.   Increase 3 minute walk distance to 700 feet Goal status: INITIAL  4.  Decrease TUG to 15 seconds Goal status: INITIAL  5.  Decrease 5XSTS to 18 seconds Goal status: INITIAL  PLAN:  PT FREQUENCY: 1-2x/week  PT DURATION: 12 weeks  PLANNED INTERVENTIONS: 97164- PT Re-evaluation, 97110-Therapeutic exercises, 97530- Therapeutic activity, 97112- Neuromuscular re-education, 97535- Self Care, 21308- Manual therapy, [id number]- Gait training, 97014- Electrical stimulation (unattended), Patient/Family education, Balance training, Stair training, Dry Needling, Cryotherapy, and Moist heat.  PLAN FOR NEXT SESSION: Balance   Jearld Lesch, PT 06/10/2023, 2:00 PM

## 2023-06-10 ENCOUNTER — Ambulatory Visit: Payer: Medicare Other | Attending: Family Medicine | Admitting: Physical Therapy

## 2023-06-10 ENCOUNTER — Encounter: Payer: Self-pay | Admitting: Physical Therapy

## 2023-06-10 DIAGNOSIS — M25562 Pain in left knee: Secondary | ICD-10-CM | POA: Diagnosis not present

## 2023-06-10 DIAGNOSIS — R2681 Unsteadiness on feet: Secondary | ICD-10-CM | POA: Insufficient documentation

## 2023-06-10 DIAGNOSIS — G8929 Other chronic pain: Secondary | ICD-10-CM | POA: Insufficient documentation

## 2023-06-10 DIAGNOSIS — M6281 Muscle weakness (generalized): Secondary | ICD-10-CM | POA: Diagnosis not present

## 2023-06-10 DIAGNOSIS — R262 Difficulty in walking, not elsewhere classified: Secondary | ICD-10-CM | POA: Diagnosis not present

## 2023-06-12 DIAGNOSIS — H33303 Unspecified retinal break, bilateral: Secondary | ICD-10-CM | POA: Diagnosis not present

## 2023-06-12 DIAGNOSIS — H5203 Hypermetropia, bilateral: Secondary | ICD-10-CM | POA: Diagnosis not present

## 2023-06-12 DIAGNOSIS — H3540 Unspecified peripheral retinal degeneration: Secondary | ICD-10-CM | POA: Diagnosis not present

## 2023-06-12 DIAGNOSIS — H524 Presbyopia: Secondary | ICD-10-CM | POA: Diagnosis not present

## 2023-06-12 DIAGNOSIS — H53143 Visual discomfort, bilateral: Secondary | ICD-10-CM | POA: Diagnosis not present

## 2023-06-12 DIAGNOSIS — H52223 Regular astigmatism, bilateral: Secondary | ICD-10-CM | POA: Diagnosis not present

## 2023-06-12 DIAGNOSIS — Z961 Presence of intraocular lens: Secondary | ICD-10-CM | POA: Diagnosis not present

## 2023-06-13 DIAGNOSIS — N401 Enlarged prostate with lower urinary tract symptoms: Secondary | ICD-10-CM | POA: Diagnosis not present

## 2023-06-13 DIAGNOSIS — R311 Benign essential microscopic hematuria: Secondary | ICD-10-CM | POA: Diagnosis not present

## 2023-06-13 DIAGNOSIS — R310 Gross hematuria: Secondary | ICD-10-CM | POA: Diagnosis not present

## 2023-06-13 DIAGNOSIS — R3912 Poor urinary stream: Secondary | ICD-10-CM | POA: Diagnosis not present

## 2023-06-16 DIAGNOSIS — R29818 Other symptoms and signs involving the nervous system: Secondary | ICD-10-CM | POA: Diagnosis not present

## 2023-06-16 DIAGNOSIS — G3184 Mild cognitive impairment, so stated: Secondary | ICD-10-CM | POA: Diagnosis not present

## 2023-06-16 DIAGNOSIS — Z6823 Body mass index (BMI) 23.0-23.9, adult: Secondary | ICD-10-CM | POA: Diagnosis not present

## 2023-06-16 DIAGNOSIS — F4320 Adjustment disorder, unspecified: Secondary | ICD-10-CM | POA: Diagnosis not present

## 2023-06-16 DIAGNOSIS — R310 Gross hematuria: Secondary | ICD-10-CM | POA: Diagnosis not present

## 2023-06-17 DIAGNOSIS — C4442 Squamous cell carcinoma of skin of scalp and neck: Secondary | ICD-10-CM | POA: Diagnosis not present

## 2023-06-20 DIAGNOSIS — R310 Gross hematuria: Secondary | ICD-10-CM | POA: Diagnosis not present

## 2023-06-20 DIAGNOSIS — N401 Enlarged prostate with lower urinary tract symptoms: Secondary | ICD-10-CM | POA: Diagnosis not present

## 2023-06-20 DIAGNOSIS — R338 Other retention of urine: Secondary | ICD-10-CM | POA: Diagnosis not present

## 2023-06-24 DIAGNOSIS — R338 Other retention of urine: Secondary | ICD-10-CM | POA: Diagnosis not present

## 2023-07-07 ENCOUNTER — Ambulatory Visit: Payer: Medicare Other | Admitting: Physical Therapy

## 2023-07-07 ENCOUNTER — Encounter: Payer: Self-pay | Admitting: Physical Therapy

## 2023-07-07 DIAGNOSIS — G8929 Other chronic pain: Secondary | ICD-10-CM

## 2023-07-07 DIAGNOSIS — R2681 Unsteadiness on feet: Secondary | ICD-10-CM

## 2023-07-07 DIAGNOSIS — M6281 Muscle weakness (generalized): Secondary | ICD-10-CM | POA: Diagnosis not present

## 2023-07-07 DIAGNOSIS — R262 Difficulty in walking, not elsewhere classified: Secondary | ICD-10-CM

## 2023-07-07 DIAGNOSIS — M25562 Pain in left knee: Secondary | ICD-10-CM | POA: Diagnosis not present

## 2023-07-07 NOTE — Therapy (Signed)
OUTPATIENT PHYSICAL THERAPY THORACOLUMBAR TREATMENT   Patient Name: Nicholas Mahoney MRN: [id number] DOB:06/29/1946, 77 y.o., male Today's Date: 07/07/2023  END OF SESSION:  PT End of Session - 07/07/23 1708     Visit Number 2    Date for PT Re-Evaluation 08/11/23    Authorization Type Medicare    PT Start Time 1440    PT Stop Time 1525    PT Time Calculation (min) 45 min    Activity Tolerance Patient tolerated treatment well    Behavior During Therapy [facility] for tasks assessed/performed             Past Medical History:  Diagnosis Date   Bladder outlet obstruction    BPH (benign prostatic hyperplasia)    ED (erectile dysfunction)    Hereditary hemochromatosis (HCC)    followed by dr Vicie Mutters 05-21-2021, no recent theraputic phlebotomy needed   History of COVID-19 12/2020   asymptomatic   Hyperlipidemia    Lower urinary tract symptoms (LUTS)    OA (osteoarthritis) of knee    Vitamin D deficiency    Wears glasses    Wears hearing aid    bilateral   Past Surgical History:  Procedure Laterality Date   APPENDECTOMY  1980's   CARDIAC CATHETERIZATION     abnormal myoview/  no sig. cad,  normal LVF,  ef 60%,  false positive myoview 01-26-2008  dr Anne Fu   GREEN LIGHT LASER TURP (TRANSURETHRAL RESECTION OF PROSTATE N/A 06/13/2015   Procedure: GREEN LIGHT LASER TURP (TRANSURETHRAL RESECTION OF PROSTATE;  Surgeon: Jerilee Field, MD;  Location: [facility] [place];  Service: Urology;  Laterality: N/A;   HERNIA REPAIR  infant   KNEE ARTHROSCOPY W/ MENISCECTOMY Bilateral left  10-04-2008/  right 11-09-2007   and chondraplasty/  synovectomy   TRANSURETHRAL RESECTION OF PROSTATE N/A 05/18/2019   Procedure: TRANSURETHRAL RESECTION OF THE PROSTATE (TURP);  Surgeon: Jerilee Field, MD;  Location: [facility];  Service: Urology;  Laterality: N/A;   TRANSURETHRAL RESECTION OF PROSTATE N/A 03/19/2022   Procedure: CYSTOSCOPY/ TRANSURETHRAL RESECTION OF  THE PROSTATE (TURP) RESIDUAL;  Surgeon: Jerilee Field, MD;  Location: [facility];  Service: Urology;  Laterality: N/A;   VASECTOMY  1980's   Patient Active Problem List   Diagnosis Date Noted   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 Yes: NPRS scale: 2/10 Pain location: left knee pain Pain description: tired, and ache Aggravating factors: walking without cane, steps Relieving factors: using cane helps, rest  PRECAUTIONS: Fall  RED FLAGS: None   WEIGHT BEARING RESTRICTIONS: No  FALLS:  Has patient fallen in last 6 months? No and reports stumbles and near falls  LIVING ENVIRONMENT: Lives with: lives with their family Lives in: House/apartment Stairs: Yes: Internal: 12 steps; can reach both Has following equipment at home: Single point cane  OCCUPATION: retired Psychologist, occupational  PLOF: Independent and has started using a cane for most walking  PATIENT GOALS: walking faster, feeling more stable, possibly walk longer less use of the cane  NEXT MD VISIT: 6 months  OBJECTIVE:  Note: Objective measures were completed at Evaluation unless otherwise noted.  DIAGNOSTIC FINDINGS:  none COGNITION: Overall cognitive status: Within  functional limits for tasks assessed     SENSATION: Lower legs are cool to the touch  MUSCLE LENGTH: Very tight HS, calves, ITB, piriformis  POSTURE: rounded shoulders, forward head, and decreased lumbar lordosis  PALPATION: LE's lower leg are cool to touch  LUMBAR ROM:   AROM eval  Flexion Decreased 25%  Extension Decreased 100%  Right lateral flexion Decreased 50%  Left lateral flexion Decreased 50%  Right rotation   Left rotation    (Blank rows = not tested)  LOWER EXTREMITY ROM:     Active  Right eval Left eval  Hip flexion    Hip extension    Hip abduction    Hip adduction    Hip internal rotation    Hip external rotation    Knee flexion    Knee extension 4 12  Ankle dorsiflexion    Ankle plantarflexion    Ankle inversion    Ankle eversion     (Blank rows = not tested)  LOWER EXTREMITY MMT:    MMT Right eval Left eval  Hip flexion 4 4  Hip extension 4 4  Hip abduction 4 4  Hip adduction    Hip internal rotation    Hip external rotation    Knee flexion 4 4  Knee extension 4- 4-  Ankle dorsiflexion    Ankle plantarflexion    Ankle inversion    Ankle eversion     (Blank rows = not tested)  FUNCTIONAL TESTS:  BERG 14/56, was 47/56 In September 2023 he scored 47/56   TUG 17 seconds, needs hands to get up, no device uncontrolled sit   3 minute walk test 500 feet no device, occasionally would use wall or  Furniture to balance GAIT: Distance walked: see above Assistive device utilized: Single point cane and None Level of assistance: SBA Comments: very unsteady, but if he is moving he does better, currently needs a cane, if he is standing without walking he has to be in motion to find his balance and is always fighting his balance  TODAY'S TREATMENT:  DATE:   07/07/23 Nustep level 5 x 4 minutes Gait outside with walking  stick up and down hill Butt touches to wall and then scapula touches On airex ball toss Step turn touch  Side step on and off airex Walking ball toss 40# resisted gait all directions Side step over stick 8" toe touches On airex facing wall reaching Off airex reaching   PATIENT EDUCATION:  Education details: POC/HEP Person educated: Patient Education method: Explanation Education comprehension: verbalized understanding  HOME EXERCISE PROGRAM: Access Code: [email or web address] URL: [email or web address] Date: 06/10/2023 Prepared by: Stacie Glaze  Exercises - Standing Marching  - 1 x daily - 7 x weekly - 2 sets - 10 reps - 3 hold - Standing Hip Abduction  - 1 x daily - 7 x weekly - 2 sets - 10 reps - 3 hold - Standing Hip Extension with Chair  - 1 x daily - 7 x weekly - 2 sets - 10 reps - 3 hold  ASSESSMENT:  CLINICAL IMPRESSION: Patient is a 77 y.o. male who was seen today for physical therapy evaluation and treatment for balance issues, and unsteady gait.  He scored a 14/56 on the BERG and last year in September this score was 47/56. He really struggles to stand, tends to constantly be moving and adjusting his balance.  If he is moving he does well, if he is touching something like the wall or has hand on furniture he does well.  He denies neuropathy but does have cold lower legs.  I added different exercises to work on balance, he tends to be back on his heels, I tried to get him to get his COG forward but again he really struggles with this  OBJECTIVE IMPAIRMENTS: Abnormal gait, cardiopulmonary status limiting activity, decreased activity tolerance, decreased balance, decreased coordination, decreased endurance, decreased mobility, difficulty walking, decreased ROM, decreased strength, increased fascial restrictions, impaired perceived functional ability, increased muscle spasms, impaired flexibility, improper body mechanics, postural dysfunction, and pain.   REHAB  POTENTIAL: Good  CLINICAL DECISION MAKING: Stable/uncomplicated  EVALUATION COMPLEXITY: Low   GOALS: Goals reviewed with patient? Yes  SHORT TERM GOALS: Target date: 07/10/23  Independent with initial HEP Goal status: met 07/07/23  LONG TERM GOALS: Target date: 09/07/23  Independent with advanced HEP Goal status: INITIAL  2.  Increase BERG balance test score to 40/56 Goal status: INITIAL  3.  Increase 3 minute walk distance to 700 feet Goal status: INITIAL  4.  Decrease TUG to 15 seconds Goal status: INITIAL  5.  Decrease 5XSTS to 18 seconds Goal status: INITIAL  PLAN:  PT FREQUENCY: 1-2x/week  PT DURATION: 12 weeks  PLANNED INTERVENTIONS: 97164- PT Re-evaluation, 97110-Therapeutic exercises, 97530- Therapeutic activity, 97112- Neuromuscular re-education, 97535- Self Care, 42595- Manual therapy, [id number]- Gait training, 97014- Electrical stimulation (unattended), Patient/Family education, Balance training, Stair training, Dry Needling, Cryotherapy, and Moist heat.  PLAN FOR NEXT SESSION: Balance   Jearld Lesch, PT 07/07/2023, 5:09 PM

## 2023-07-10 DIAGNOSIS — R310 Gross hematuria: Secondary | ICD-10-CM | POA: Diagnosis not present

## 2023-07-10 DIAGNOSIS — N401 Enlarged prostate with lower urinary tract symptoms: Secondary | ICD-10-CM | POA: Diagnosis not present

## 2023-07-10 DIAGNOSIS — R3912 Poor urinary stream: Secondary | ICD-10-CM | POA: Diagnosis not present

## 2023-07-10 DIAGNOSIS — N281 Cyst of kidney, acquired: Secondary | ICD-10-CM | POA: Diagnosis not present

## 2023-07-16 ENCOUNTER — Ambulatory Visit: Payer: Medicare Other | Attending: Family Medicine | Admitting: Physical Therapy

## 2023-07-16 ENCOUNTER — Encounter: Payer: Self-pay | Admitting: Physical Therapy

## 2023-07-16 DIAGNOSIS — R262 Difficulty in walking, not elsewhere classified: Secondary | ICD-10-CM | POA: Insufficient documentation

## 2023-07-16 DIAGNOSIS — G8929 Other chronic pain: Secondary | ICD-10-CM | POA: Insufficient documentation

## 2023-07-16 DIAGNOSIS — M25562 Pain in left knee: Secondary | ICD-10-CM | POA: Insufficient documentation

## 2023-07-16 DIAGNOSIS — M6281 Muscle weakness (generalized): Secondary | ICD-10-CM | POA: Diagnosis not present

## 2023-07-16 DIAGNOSIS — R2681 Unsteadiness on feet: Secondary | ICD-10-CM | POA: Diagnosis not present

## 2023-07-16 NOTE — Therapy (Signed)
 OUTPATIENT PHYSICAL THERAPY THORACOLUMBAR TREATMENT   Patient Name: Nicholas Mahoney MRN: [id number] DOB:07-08-1946, 78 y.o., male Today's Date: 07/16/2023  END OF SESSION:  PT End of Session - 07/16/23 1755     Visit Number 3    Date for PT Re-Evaluation 08/11/23    Authorization Type Medicare    PT Start Time 1745    PT Stop Time 1830    PT Time Calculation (min) 45 min    Activity Tolerance Patient tolerated treatment well    Behavior During Therapy [facility] for tasks assessed/performed             Past Medical History:  Diagnosis Date   Bladder outlet obstruction    BPH (benign prostatic hyperplasia)    ED (erectile dysfunction)    Hereditary hemochromatosis (HCC)    followed by dr cloretta ronco 05-21-2021, no recent theraputic phlebotomy needed   History of COVID-19 12/2020   asymptomatic   Hyperlipidemia    Lower urinary tract symptoms (LUTS)    OA (osteoarthritis) of knee    Vitamin D  deficiency    Wears glasses    Wears hearing aid    bilateral   Past Surgical History:  Procedure Laterality Date   APPENDECTOMY  1980's   CARDIAC CATHETERIZATION     abnormal myoview/  no sig. cad,  normal LVF,  ef 60%,  false positive myoview 01-26-2008  dr jeffrie   GREEN LIGHT LASER TURP (TRANSURETHRAL RESECTION OF PROSTATE N/A 06/13/2015   Procedure: GREEN LIGHT LASER TURP (TRANSURETHRAL RESECTION OF PROSTATE;  Surgeon: Donnice Brooks, MD;  Location: [facility] [place];  Service: Urology;  Laterality: N/A;   HERNIA REPAIR  infant   KNEE ARTHROSCOPY W/ MENISCECTOMY Bilateral left  10-04-2008/  right 11-09-2007   and chondraplasty/  synovectomy   TRANSURETHRAL RESECTION OF PROSTATE N/A 05/18/2019   Procedure: TRANSURETHRAL RESECTION OF THE PROSTATE (TURP);  Surgeon: Brooks Donnice, MD;  Location: [facility];  Service: Urology;  Laterality: N/A;   TRANSURETHRAL RESECTION OF PROSTATE N/A 03/19/2022   Procedure: CYSTOSCOPY/ TRANSURETHRAL RESECTION OF  THE PROSTATE (TURP) RESIDUAL;  Surgeon: Brooks Donnice, MD;  [facility];  Service: Urology;  Laterality: N/A;   VASECTOMY  1980's   Patient Active Problem List   Diagnosis Date Noted   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 Yes: NPRS scale: 2/10 Pain location: left knee pain Pain description: tired, and ache Aggravating factors: walking without cane, steps Relieving factors: using cane helps, rest  PRECAUTIONS: Fall  RED FLAGS: None   WEIGHT BEARING RESTRICTIONS: No  FALLS:  Has patient fallen in last 6 months? No and reports stumbles and near falls  LIVING ENVIRONMENT: Lives with: lives with their family Lives in: House/apartment Stairs: Yes: Internal: 12 steps; can reach both Has following equipment at home: Single point cane  OCCUPATION: retired psychologist, occupational  PLOF: Independent and has started using a cane for most walking  PATIENT GOALS: walking faster, feeling more stable, possibly walk longer less use of the cane  NEXT MD VISIT: 6 months  OBJECTIVE:  Note: Objective measures were completed at Evaluation unless otherwise noted.  DIAGNOSTIC FINDINGS:  none COGNITION: Overall cognitive status: Within functional limits for tasks  assessed     SENSATION: Lower legs are cool to the touch  MUSCLE LENGTH: Very tight HS, calves, ITB, piriformis  POSTURE: rounded shoulders, forward head, and decreased lumbar lordosis  PALPATION: LE's lower leg are cool to touch  LUMBAR ROM:   AROM eval  Flexion Decreased 25%  Extension Decreased 100%  Right lateral flexion Decreased 50%  Left lateral flexion Decreased 50%  Right rotation   Left rotation    (Blank rows = not tested)  LOWER EXTREMITY ROM:     Active  Right eval Left eval  Hip flexion    Hip extension    Hip abduction    Hip adduction    Hip internal rotation    Hip external rotation    Knee flexion    Knee extension 4 12  Ankle dorsiflexion    Ankle plantarflexion    Ankle inversion    Ankle eversion     (Blank rows = not tested)  LOWER EXTREMITY MMT:    MMT Right eval Left eval Right/left 07/16/23  Hip flexion 4 4   Hip extension 4 4   Hip abduction 4 4   Hip adduction     Hip internal rotation     Hip external rotation     Knee flexion 4 4   Knee extension 4- 4-   Ankle dorsiflexion 4- 3+ 4-/3+  Ankle plantarflexion   4-  Ankle inversion   4-  Ankle eversion   3+   (Blank rows = not tested)  FUNCTIONAL TESTS:  BERG 14/56, was 47/56 In September 2023 he scored 47/56   TUG 17 seconds, needs hands to get up, no device uncontrolled sit   3 minute walk test 500 feet no device, occasionally would use wall or  Furniture to balance GAIT: Distance walked: see above Assistive device utilized: Single point cane and None Level of assistance: SBA Comments: very unsteady, but if he is moving he does better, currently needs a cane, if he is standing without walking he has to be in motion to find his balance and is always fighting his balance  TODAY'S TREATMENT:  DATE:   07/15/22 Nustep level 6 x 6 minutes LE only In  pbars step turn touch On airex touching numbers on wall with some cognitive math  Side step on and off airex [employer], volleyball Ball kicks Side stepping over sticks Then walking without change of pace over sticks Tested ankle DF and eversion in box above, very weak He had a lot of questions about his condition noted below  07/07/23 Nustep level 5 x 4 minutes Gait outside with walking stick up and down hill Butt touches to wall and then scapula touches On airex ball toss Step turn touch  Side step on and off airex Walking ball toss 40# resisted gait all directions Side step over stick 8 toe touches On airex facing wall reaching Off airex reaching   PATIENT EDUCATION:  Education details: POC/HEP Person educated: Patient Education method: Explanation Education comprehension: verbalized understanding  HOME EXERCISE PROGRAM: Access Code: [email or web address] URL: [email or web address] Date: 06/10/2023 Prepared by: Ozell Mainland  Exercises - Standing Marching  - 1 x daily - 7 x weekly - 2 sets - 10 reps - 3 hold - Standing Hip Abduction  - 1 x daily - 7 x weekly - 2 sets - 10 reps - 3 hold - Standing Hip Extension with Chair  - 1 x daily - 7 x weekly - 2 sets - 10 reps - 3 hold  ASSESSMENT:  CLINICAL IMPRESSION: Patient is a 78 y.o. male who was seen today for physical therapy evaluation and treatment for balance issues, and unsteady gait.  He scored a 14/56 on the BERG and last year in September this score was 47/56. He really struggles to stand, tends to constantly be moving and adjusting his balance.  If he is moving he does well, if he is touching something like the wall or has hand on furniture he does well.  He denies neuropathy but does have cold lower legs, I tested the ankle strength and he is weak with DF and inversion.  I continue to add different exercises to work on balance, he tends to be back on his heels, I tried to get him to get his COG forward but  again he really struggles with this.  He has a lot of questions regarding his condition, I met him a few years ago after TKA and questioned a neurological component due to his gait and his balance issues or neuropathy.  He reports that he can feel his feet and denies burning  OBJECTIVE IMPAIRMENTS: Abnormal gait, cardiopulmonary status limiting activity, decreased activity tolerance, decreased balance, decreased coordination, decreased endurance, decreased mobility, difficulty walking, decreased ROM, decreased strength, increased fascial restrictions, impaired perceived functional ability, increased muscle spasms, impaired flexibility, improper body mechanics, postural dysfunction, and pain.   REHAB POTENTIAL: Good  CLINICAL DECISION MAKING: Stable/uncomplicated  EVALUATION COMPLEXITY: Low   GOALS: Goals reviewed with patient? Yes  SHORT TERM GOALS: Target date: 07/10/23  Independent with initial HEP Goal status: met 07/07/23  LONG TERM GOALS: Target date: 09/07/23  Independent with advanced HEP Goal status: INITIAL  2.  Increase BERG balance test score to 40/56 Goal status: ongoing 07/16/23  3.  Increase 3 minute walk distance to 700 feet Goal status: INITIAL  4.  Decrease TUG to 15 seconds Goal status: INITIAL  5.  Decrease 5XSTS to 18 seconds Goal status: INITIAL  PLAN:  PT FREQUENCY: 1-2x/week  PT DURATION: 12 weeks  PLANNED INTERVENTIONS: 97164- PT Re-evaluation, 97110-Therapeutic exercises, 97530- Therapeutic activity, [id number]- Neuromuscular re-education, 97535- Self  Care, [id number]- Manual therapy, [phone]- Gait training, [phone]- Electrical stimulation (unattended), Patient/Family education, Balance training, Stair training, Dry Needling, Cryotherapy, and Moist heat.  PLAN FOR NEXT SESSION: Balance, may send not to neurologist   OBADIAH OZELL ORN, PT 07/16/2023, 5:56 PM

## 2023-07-23 ENCOUNTER — Ambulatory Visit: Payer: Medicare Other | Admitting: Physical Therapy

## 2023-07-23 ENCOUNTER — Encounter: Payer: Self-pay | Admitting: Physical Therapy

## 2023-07-23 DIAGNOSIS — M25562 Pain in left knee: Secondary | ICD-10-CM | POA: Diagnosis not present

## 2023-07-23 DIAGNOSIS — G8929 Other chronic pain: Secondary | ICD-10-CM

## 2023-07-23 DIAGNOSIS — M6281 Muscle weakness (generalized): Secondary | ICD-10-CM

## 2023-07-23 DIAGNOSIS — R2681 Unsteadiness on feet: Secondary | ICD-10-CM | POA: Diagnosis not present

## 2023-07-23 DIAGNOSIS — R262 Difficulty in walking, not elsewhere classified: Secondary | ICD-10-CM

## 2023-07-23 NOTE — Therapy (Signed)
 OUTPATIENT PHYSICAL THERAPY THORACOLUMBAR TREATMENT   Patient Name: Nicholas Mahoney MRN: [id number] DOB:[birth year], 78 y.o., male Today's Date: 07/23/2023  END OF SESSION:  PT End of Session - 07/23/23 1704     Visit Number 4    Date for PT Re-Evaluation 08/11/23    Authorization Type Medicare    PT Start Time 1445    PT Stop Time 1530    PT Time Calculation (min) 45 min    Activity Tolerance Patient tolerated treatment well    Behavior During Therapy [facility] for tasks assessed/performed             Past Medical History:  Diagnosis Date   Bladder outlet obstruction    BPH (benign prostatic hyperplasia)    ED (erectile dysfunction)    Hereditary hemochromatosis (HCC)    followed by dr Corene Devonshire 05-21-2021, no recent theraputic phlebotomy needed   History of COVID-19 12/2020   asymptomatic   Hyperlipidemia    Lower urinary tract symptoms (LUTS)    OA (osteoarthritis) of knee    Vitamin D  deficiency    Wears glasses    Wears hearing aid    bilateral   Past Surgical History:  Procedure Laterality Date   APPENDECTOMY  1980's   CARDIAC CATHETERIZATION     abnormal myoview/  no sig. cad,  normal LVF,  ef 60%,  false positive myoview 01-26-2008  dr Renna Cary   GREEN LIGHT LASER TURP (TRANSURETHRAL RESECTION OF PROSTATE N/A 06/13/2015   Procedure: GREEN LIGHT LASER TURP (TRANSURETHRAL RESECTION OF PROSTATE;  Surgeon: Christina Coyer, MD;  Location: [facility];  Service: Urology;  Laterality: N/A;   HERNIA REPAIR  infant   KNEE ARTHROSCOPY W/ MENISCECTOMY Bilateral left  10-04-2008/  right 11-09-2007   and chondraplasty/  synovectomy   TRANSURETHRAL RESECTION OF PROSTATE N/A 05/18/2019   Procedure: TRANSURETHRAL RESECTION OF THE PROSTATE (TURP);  Surgeon: Christina Coyer, MD;  Location: [facility];  Service: Urology;  Laterality: N/A;   TRANSURETHRAL RESECTION OF PROSTATE N/A 03/19/2022   Procedure: CYSTOSCOPY/ TRANSURETHRAL RESECTION OF  THE PROSTATE (TURP) RESIDUAL;  Surgeon: Christina Coyer, MD;  Location: [facility];  Service: Urology;  Laterality: N/A;   VASECTOMY  1980's   Patient Active Problem List   Diagnosis Date Noted   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 Yes: NPRS scale: 2/10 Pain location: left knee pain Pain description: tired, and ache Aggravating factors: walking without cane, steps Relieving factors: using cane helps, rest  PRECAUTIONS: Fall  RED FLAGS: None   WEIGHT BEARING RESTRICTIONS: No  FALLS:  Has patient fallen in last 6 months? No and reports stumbles and near falls  LIVING ENVIRONMENT: Lives with: lives with their family Lives in: House/apartment Stairs: Yes: Internal: 12 steps; can reach both Has following equipment at home: Single point cane  OCCUPATION: retired Psychologist, occupational  PLOF: Independent and has started using a cane for most walking  PATIENT GOALS: walking faster, feeling more stable, possibly walk longer less use of the cane  NEXT MD VISIT: 6 months  OBJECTIVE:  Note: Objective measures were completed at Evaluation unless otherwise noted.  DIAGNOSTIC FINDINGS:  none COGNITION: Overall cognitive status: Within functional limits for tasks  assessed     SENSATION: Lower legs are cool to the touch  MUSCLE LENGTH: Very tight HS, calves, ITB, piriformis  POSTURE: rounded shoulders, forward head, and decreased lumbar lordosis  PALPATION: LE's lower leg are cool to touch  LUMBAR ROM:   AROM eval  Flexion Decreased 25%  Extension Decreased 100%  Right lateral flexion Decreased 50%  Left lateral flexion Decreased 50%  Right rotation   Left rotation    (Blank rows = not tested)  LOWER EXTREMITY ROM:     Active  Right eval Left eval  Hip flexion    Hip extension    Hip abduction    Hip adduction    Hip internal rotation    Hip external rotation    Knee flexion    Knee extension 4 12  Ankle dorsiflexion    Ankle plantarflexion    Ankle inversion    Ankle eversion     (Blank rows = not tested)  LOWER EXTREMITY MMT:    MMT Right eval Left eval Right/left 07/16/23  Hip flexion 4 4   Hip extension 4 4   Hip abduction 4 4   Hip adduction     Hip internal rotation     Hip external rotation     Knee flexion 4 4   Knee extension 4- 4-   Ankle dorsiflexion 4- 3+ 4-/3+  Ankle plantarflexion   4-  Ankle inversion   4-  Ankle eversion   3+   (Blank rows = not tested)  FUNCTIONAL TESTS:  BERG 14/56, was 47/56 In September 2023 he scored 47/56   TUG 17 seconds, needs hands to get up, no device uncontrolled sit   3 minute walk test 500 feet no device, occasionally would use wall or  Furniture to balance GAIT: Distance walked: see above Assistive device utilized: Single point cane and None Level of assistance: SBA Comments: very unsteady, but if he is moving he does better, currently needs a cane, if he is standing without walking he has to be in motion to find his balance and is always fighting his balance  TODAY'S TREATMENT:  DATE:   07/23/23 Bike level 5 x 6 minutes Back to wall  butt and scapular touches In pbars touching numbers on wall and then doing this on the airex Walking ball toss Standing on slant board two ways working on balance Cone toe touch Standing on slant board two ways reaching for numbers on wall Direction changes with walking Tandem walk  07/15/22 Nustep level 6 x 6 minutes LE only In pbars step turn touch On airex touching numbers on wall with some cognitive math  Side step on and off airex [employer], volleyball Ball kicks Side stepping over sticks Then walking without change of pace over sticks Tested ankle DF and eversion in box above, very weak He had a lot of questions about his condition noted below  07/07/23 Nustep level 5 x 4 minutes Gait outside with walking stick up and down hill Butt touches to wall and then scapula touches On airex ball toss Step turn touch  Side step on and off airex Walking ball toss 40# resisted gait all directions Side step over stick 8" toe touches On airex facing wall reaching Off airex reaching   PATIENT EDUCATION:  Education details: POC/HEP Person educated: Patient Education method: Explanation Education comprehension: verbalized understanding  HOME EXERCISE PROGRAM: Access Code: [email or web address] URL: [email or web address] Date: 06/10/2023 Prepared by: Cherylene Corrente  Exercises - Standing Marching  - 1 x daily - 7 x weekly - 2 sets - 10 reps - 3 hold - Standing Hip Abduction  - 1 x daily - 7 x weekly - 2 sets - 10 reps - 3 hold - Standing Hip Extension with Chair  - 1 x daily - 7 x weekly - 2 sets - 10 reps - 3 hold  ASSESSMENT:  CLINICAL IMPRESSION: Patient is a 78 y.o. male who was seen today for physical therapy evaluation and treatment for balance issues, and unsteady gait.  He scored a 14/56 on the BERG and last year in September this score was 47/56. He really struggles to stand, tends to constantly be moving and adjusting his balance.  If he is moving he does well, if  he is touching something like the wall or has hand on furniture he does well.  He denies neuropathy but does have cold lower legs, I tested the ankle strength and he is weak with DF and inversion.  I continue to add different exercises to work on balance, he tends to be back on his heels, I tried to get him to get his COG forward but again he really struggles with this.  I added today him on a slant board with it two ways slanting up and then slanting down, surprisingly he did very well with this and after was able to maintain balance with less extraneous movements.  He has a lot of questions regarding his condition, I met him a few years ago after TKA and questioned a neurological component due to his gait and his balance issues or neuropathy.  He reports that he can feel his feet and denies burning  OBJECTIVE IMPAIRMENTS: Abnormal gait, cardiopulmonary status limiting activity, decreased activity tolerance, decreased balance, decreased coordination, decreased endurance, decreased mobility, difficulty walking, decreased ROM, decreased strength, increased fascial restrictions, impaired perceived functional ability, increased muscle spasms, impaired flexibility, improper body mechanics, postural dysfunction, and pain.   REHAB POTENTIAL: Good  CLINICAL DECISION MAKING: Stable/uncomplicated  EVALUATION COMPLEXITY: Low   GOALS: Goals reviewed with patient? Yes  SHORT TERM GOALS: Target date: 07/10/23  Independent with initial  HEP Goal status: met 07/07/23  LONG TERM GOALS: Target date: 09/07/23  Independent with advanced HEP Goal status: INITIAL  2.  Increase BERG balance test score to 40/56 Goal status: ongoing 07/16/23  3.  Increase 3 minute walk distance to 700 feet Goal status: ongoing 07/23/23  4.  Decrease TUG to 15 seconds Goal status: INITIAL  5.  Decrease 5XSTS to 18 seconds Goal status: ongoing 07/23/23  PLAN:  PT FREQUENCY: 1-2x/week  PT DURATION: 12 weeks  PLANNED  INTERVENTIONS: 97164- PT Re-evaluation, 97110-Therapeutic exercises, 97530- Therapeutic activity, 97112- Neuromuscular re-education, 97535- Self Care, 13086- Manual therapy, 97116- Gait training, 97014- Electrical stimulation (unattended), Patient/Family education, Balance training, Stair training, Dry Needling, Cryotherapy, and Moist heat.  PLAN FOR NEXT SESSION: Balance, may send not to neurologist   Hollis Lurie, PT 07/23/2023, 5:05 PM

## 2023-07-25 ENCOUNTER — Ambulatory Visit (INDEPENDENT_AMBULATORY_CARE_PROVIDER_SITE_OTHER): Payer: Medicare Other | Admitting: Psychology

## 2023-07-25 ENCOUNTER — Ambulatory Visit: Payer: Self-pay

## 2023-07-25 DIAGNOSIS — R413 Other amnesia: Secondary | ICD-10-CM

## 2023-07-25 NOTE — Progress Notes (Signed)
   Neuropsychology Note [place]. [facility] [place] Department of Neurology     Nicholas Mahoney was initially added in a last-minute cancellation spot for his neuropsychological evalaution. However, he called later that day to inform the office that he could not make this appointment after all. He will be added back to the wait list. He should not be charged a no show fee given his provision of advanced warning.

## 2023-07-30 ENCOUNTER — Ambulatory Visit: Payer: Medicare Other | Admitting: Physical Therapy

## 2023-07-30 ENCOUNTER — Encounter: Payer: Self-pay | Admitting: Physical Therapy

## 2023-07-30 DIAGNOSIS — G8929 Other chronic pain: Secondary | ICD-10-CM | POA: Diagnosis not present

## 2023-07-30 DIAGNOSIS — R262 Difficulty in walking, not elsewhere classified: Secondary | ICD-10-CM | POA: Diagnosis not present

## 2023-07-30 DIAGNOSIS — M6281 Muscle weakness (generalized): Secondary | ICD-10-CM

## 2023-07-30 DIAGNOSIS — R2681 Unsteadiness on feet: Secondary | ICD-10-CM | POA: Diagnosis not present

## 2023-07-30 DIAGNOSIS — M25562 Pain in left knee: Secondary | ICD-10-CM | POA: Diagnosis not present

## 2023-07-30 NOTE — Therapy (Signed)
OUTPATIENT PHYSICAL THERAPY THORACOLUMBAR TREATMENT   Patient Name: CORDERIO HALFACRE MRN: [id number] DOB:[birth year], 78 y.o., male Today's Date: 07/30/2023  END OF SESSION:  PT End of Session - 07/30/23 1452     Visit Number 5    Date for PT Re-Evaluation 08/11/23    Authorization Type Medicare    PT Start Time 1443    PT Stop Time 1530    PT Time Calculation (min) 47 min    Activity Tolerance Patient tolerated treatment well    Behavior During Therapy [facility] for tasks assessed/performed             Past Medical History:  Diagnosis Date   Bladder outlet obstruction    BPH (benign prostatic hyperplasia)    ED (erectile dysfunction)    Hereditary hemochromatosis (HCC)    followed by dr Vicie Mutters 05-21-2021, no recent theraputic phlebotomy needed   History of COVID-19 12/2020   asymptomatic   Hyperlipidemia    Lower urinary tract symptoms (LUTS)    OA (osteoarthritis) of knee    Vitamin D deficiency    Wears glasses    Wears hearing aid    bilateral   Past Surgical History:  Procedure Laterality Date   APPENDECTOMY  1980's   CARDIAC CATHETERIZATION     abnormal myoview/  no sig. cad,  normal LVF,  ef 60%,  false positive myoview 01-26-2008  dr Anne Fu   GREEN LIGHT LASER TURP (TRANSURETHRAL RESECTION OF PROSTATE N/A 06/13/2015   Procedure: GREEN LIGHT LASER TURP (TRANSURETHRAL RESECTION OF PROSTATE;  Surgeon: Jerilee Field, MD;  Location: [facility] [place];  Service: Urology;  Laterality: N/A;   HERNIA REPAIR  infant   KNEE ARTHROSCOPY W/ MENISCECTOMY Bilateral left  10-04-2008/  right 11-09-2007   and chondraplasty/  synovectomy   TRANSURETHRAL RESECTION OF PROSTATE N/A 05/18/2019   Procedure: TRANSURETHRAL RESECTION OF THE PROSTATE (TURP);  Surgeon: Jerilee Field, MD;  Location: [facility];  Service: Urology;  Laterality: N/A;   TRANSURETHRAL RESECTION OF PROSTATE N/A 03/19/2022   Procedure: CYSTOSCOPY/ TRANSURETHRAL RESECTION OF  THE PROSTATE (TURP) RESIDUAL;  Surgeon: Jerilee Field, MD;  Location: [facility];  Service: Urology;  Laterality: N/A;   VASECTOMY  1980's   Patient Active Problem List   Diagnosis Date Noted   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   Hereditary hemochromatosis (HCC) 04/10/2017    PCP: Gweneth Dimitri, MD  REFERRING PROVIDER: Corliss Blacker, MD  REFERRING DIAG: poor balance  Rationale for Evaluation and Treatment: Rehabilitation  THERAPY DIAG:  Muscle weakness (generalized)  Difficulty in walking, not elsewhere classified  Unsteadiness on feet  ONSET DATE: 05/08/23  SUBJECTIVE:  SUBJECTIVE STATEMENT: Patient had to cancel an appointment with [facility], and the appointment went from May to December of this year.    PERTINENT HISTORY:  Bilateral TKA  PAIN:  Are you having pain? Yes: NPRS scale: 2/10 Pain location: left knee pain Pain description: tired, and ache Aggravating factors: walking without cane, steps Relieving factors: using cane helps, rest  PRECAUTIONS: Fall  RED FLAGS: None   WEIGHT BEARING RESTRICTIONS: No  FALLS:  Has patient fallen in last 6 months? No and reports stumbles and near falls  LIVING ENVIRONMENT: Lives with: lives with their family Lives in: House/apartment Stairs: Yes: Internal: 12 steps; can reach both Has following equipment at home: Single point cane  OCCUPATION: retired Psychologist, occupational  PLOF: Independent and has started using a cane for most walking  PATIENT GOALS: walking faster, feeling more stable, possibly walk longer less use of the cane  NEXT MD VISIT: 6 months  OBJECTIVE:  Note: Objective measures were completed at Evaluation unless otherwise noted.  DIAGNOSTIC FINDINGS:  none COGNITION: Overall cognitive status: Within  functional limits for tasks assessed     SENSATION: Lower legs are cool to the touch  MUSCLE LENGTH: Very tight HS, calves, ITB, piriformis  POSTURE: rounded shoulders, forward head, and decreased lumbar lordosis  PALPATION: LE's lower leg are cool to touch  LUMBAR ROM:   AROM eval  Flexion Decreased 25%  Extension Decreased 100%  Right lateral flexion Decreased 50%  Left lateral flexion Decreased 50%  Right rotation   Left rotation    (Blank rows = not tested)  LOWER EXTREMITY ROM:     Active  Right eval Left eval  Hip flexion    Hip extension    Hip abduction    Hip adduction    Hip internal rotation    Hip external rotation    Knee flexion    Knee extension 4 12  Ankle dorsiflexion    Ankle plantarflexion    Ankle inversion    Ankle eversion     (Blank rows = not tested)  LOWER EXTREMITY MMT:    MMT Right eval Left eval Right/left 07/16/23  Hip flexion 4 4   Hip extension 4 4   Hip abduction 4 4   Hip adduction     Hip internal rotation     Hip external rotation     Knee flexion 4 4   Knee extension 4- 4-   Ankle dorsiflexion 4- 3+ 4-/3+  Ankle plantarflexion   4-  Ankle inversion   4-  Ankle eversion   3+   (Blank rows = not tested)  FUNCTIONAL TESTS:  BERG 14/56, was 47/56 In September 2023 he scored 47/56   TUG 17 seconds, needs hands to get up, no device uncontrolled sit   3 minute walk test 500 feet no device, occasionally would use wall or  Furniture to balance GAIT: Distance walked: see above Assistive device utilized: Single point cane and None Level of assistance: SBA Comments: very unsteady, but if he is moving he does better, currently needs a cane, if he is standing without walking he has to be in motion to find his balance and is always fighting his balance  TODAY'S TREATMENT:  DATE:   07/30/23 Discussed  the correspondence with the MD office 40# resisted gait all directions On slant board two ways reaching for #'s on wall Side stepping over sticks Walking over sticks trying to have longer stride Back to wall butt and shoulder blade touches Facing wall head and tried pelvic touches Sit-stand-throw-catch-sit with 6.6# ball Sit to stand with reaching overhead with 6.6# ball Standing ball toss looking up  07/23/23 Bike level 5 x 6 minutes Back to wall butt and scapular touches In pbars touching numbers on wall and then doing this on the airex Walking ball toss Standing on slant board two ways working on balance Cone toe touch Standing on slant board two ways reaching for numbers on wall Direction changes with walking Tandem walk  07/15/22 Nustep level 6 x 6 minutes LE only In pbars step turn touch On airex touching numbers on wall with some cognitive math  Side step on and off airex [employer], volleyball Ball kicks Side stepping over sticks Then walking without change of pace over sticks Tested ankle DF and eversion in box above, very weak He had a lot of questions about his condition noted below  07/07/23 Nustep level 5 x 4 minutes Gait outside with walking stick up and down hill Butt touches to wall and then scapula touches On airex ball toss Step turn touch  Side step on and off airex Walking ball toss 40# resisted gait all directions Side step over stick 8" toe touches On airex facing wall reaching Off airex reaching   PATIENT EDUCATION:  Education details: POC/HEP Person educated: Patient Education method: Explanation Education comprehension: verbalized understanding  HOME EXERCISE PROGRAM: Access Code: [email or web address] URL: [email or web address] Date: 06/10/2023 Prepared by: Stacie Glaze  Exercises - Standing Marching  - 1 x daily - 7 x weekly - 2 sets - 10 reps - 3 hold - Standing Hip Abduction  - 1 x daily - 7 x weekly - 2 sets - 10 reps - 3  hold - Standing Hip Extension with Chair  - 1 x daily - 7 x weekly - 2 sets - 10 reps - 3 hold  ASSESSMENT:  CLINICAL IMPRESSION: Patient is a 78 y.o. male who was seen today for physical therapy evaluation and treatment for balance issues, and unsteady gait.  He scored a 14/56 on the BERG and last year in September this score was 47/56. He really struggles to stand, tends to constantly be moving and adjusting his balance.  If he is moving he does well, if he is touching something like the wall or has hand on furniture he does well.  He denies neuropathy but does have cold lower legs, I tested the ankle strength and he is weak with DF and inversion.  I continue to add different exercises to work on balance, he tends to be back on his heels, I did contact the PA that saw him recently.  She reports that she did not see any cerebellar issues but that they could see him and do a NCVT or EMG to see if neuropathy.  He continues to really struggle but after a few reps and he understands the activity he will do better.  OBJECTIVE IMPAIRMENTS: Abnormal gait, cardiopulmonary status limiting activity, decreased activity tolerance, decreased balance, decreased coordination, decreased endurance, decreased mobility, difficulty walking, decreased ROM, decreased strength, increased fascial restrictions, impaired perceived functional ability, increased muscle spasms, impaired flexibility, improper body mechanics, postural dysfunction, and pain.   REHAB POTENTIAL: Good  CLINICAL DECISION  MAKING: Stable/uncomplicated  EVALUATION COMPLEXITY: Low   GOALS: Goals reviewed with patient? Yes  SHORT TERM GOALS: Target date: 07/10/23  Independent with initial HEP Goal status: met 07/07/23  LONG TERM GOALS: Target date: 09/07/23  Independent with advanced HEP Goal status: INITIAL  2.  Increase BERG balance test score to 40/56 Goal status: ongoing 07/16/23  3.  Increase 3 minute walk distance to 700 feet Goal status:  ongoing 07/23/23  4.  Decrease TUG to 15 seconds Goal status: ongoing 07/30/23  5.  Decrease 5XSTS to 18 seconds Goal status: ongoing 07/23/23  PLAN:  PT FREQUENCY: 1-2x/week  PT DURATION: 12 weeks  PLANNED INTERVENTIONS: 97164- PT Re-evaluation, 97110-Therapeutic exercises, 97530- Therapeutic activity, 97112- Neuromuscular re-education, 97535- Self Care, 75436- Manual therapy, 97116- Gait training, 97014- Electrical stimulation (unattended), Patient/Family education, Balance training, Stair training, Dry Needling, Cryotherapy, and Moist heat.  PLAN FOR NEXT SESSION: Balance, I did correspond with the neurologist as noted above   Jearld Lesch, PT 07/30/2023, 2:52 PM

## 2023-08-08 DIAGNOSIS — Z96653 Presence of artificial knee joint, bilateral: Secondary | ICD-10-CM | POA: Insufficient documentation

## 2023-08-11 ENCOUNTER — Ambulatory Visit: Payer: Self-pay

## 2023-08-11 ENCOUNTER — Encounter: Payer: Self-pay | Admitting: Psychology

## 2023-08-11 ENCOUNTER — Ambulatory Visit (INDEPENDENT_AMBULATORY_CARE_PROVIDER_SITE_OTHER): Payer: Medicare Other | Admitting: Psychology

## 2023-08-11 DIAGNOSIS — Z974 Presence of external hearing-aid: Secondary | ICD-10-CM | POA: Insufficient documentation

## 2023-08-11 DIAGNOSIS — G3184 Mild cognitive impairment, so stated: Secondary | ICD-10-CM | POA: Insufficient documentation

## 2023-08-11 DIAGNOSIS — N400 Enlarged prostate without lower urinary tract symptoms: Secondary | ICD-10-CM | POA: Insufficient documentation

## 2023-08-11 DIAGNOSIS — R399 Unspecified symptoms and signs involving the genitourinary system: Secondary | ICD-10-CM | POA: Insufficient documentation

## 2023-08-11 DIAGNOSIS — R4189 Other symptoms and signs involving cognitive functions and awareness: Secondary | ICD-10-CM

## 2023-08-11 DIAGNOSIS — N529 Male erectile dysfunction, unspecified: Secondary | ICD-10-CM | POA: Insufficient documentation

## 2023-08-11 DIAGNOSIS — E785 Hyperlipidemia, unspecified: Secondary | ICD-10-CM | POA: Insufficient documentation

## 2023-08-11 DIAGNOSIS — E559 Vitamin D deficiency, unspecified: Secondary | ICD-10-CM | POA: Insufficient documentation

## 2023-08-11 HISTORY — DX: Mild cognitive impairment of uncertain or unknown etiology: G31.84

## 2023-08-11 NOTE — Progress Notes (Addendum)
NEUROPSYCHOLOGICAL EVALUATION [place] [place] Department of Neurology  Date of Evaluation: August 11, 2023  Reason for Referral:   KAILAN LAWS is a 78 y.o. right-handed Caucasian male referred by Marlowe Kays, PA-C, to characterize his current cognitive functioning and assist with diagnostic clarity and treatment planning in the context of subjective cognitive decline.   Assessment and Plan:   Clinical Impression(s): Mr. Burich pattern of performance is suggestive of an isolated impairment across phonemic fluency. Additional performance variability was exhibited across a daily living verbal memory task. However, performances across three other memory tasks were appropriate and not suggestive of ongoing impairment. A relative weakness could be argued across processing speed and executive functioning given high premorbid intellectual estimations. However, these domains do not represent normative impairments currently. Performances were appropriate relative to age-matched peers across attention/concentration, receptive language, semantic fluency, confrontation naming, and visuospatial abilities. Functionally, his wife noted that Mr. Skelly was having trouble forgetting to pay bills to the extent that she took this over. Diagnostically, Mr. Musial remains on the border between normal functioning and a mild neurocognitive disorder. 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 However, the very mild nature of this diagnosis should be emphasized at the present time.   Relative to his previous evaluation in November 2021, Mr. Akhtar generally exhibited stability. His only measurable decline surrounded phonemic fluency. The argument could be made for very subtle decline across processing speed, basic attention, and cognitive flexibility. However, the subtle nature should  be emphasized as these changes may not be clinically significant. Specific to memory, some decline was observed across a daily living task, particularly surrounding delayed spontaneous recall. However, all other memory tests were stable or exhibited subtle improvements.   The cause for isolated weaknesses remains uncertain. Recent neuroimaging suggested fairly numerous chronic microhemorrhages within the supratentorial brain, additional foci of chronic hemosiderin deposition scattered along the bilateral cerebral hemispheres consistent with sequelae of a remote subarachnoid hemorrhage, and chronic microhemorrhages within the cerebellar vermis and right cerebellar hemisphere. 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 This was noted in the presence of background mild microvascular ischemic disease and mild generalized atrophy. There certainly remains the potential for neuroimaging findings to represent the cause for ongoing dysfunction, exacerbated by longstanding hearing loss, marital discord, and mild depression (based upon his responses across a related mood questionnaire).  Neurologically speaking, current memory patterns and stability over time is not suggestive of typically presenting symptomatic Alzheimer's disease. However, neuroimaging raising concerns for cerebral amyloid angiopathy do increase risk for this underlying disease process and ongoing monitoring will be important moving forward. As he does not warrant a dementia diagnosis, a vascular dementia presentation is inappropriate. He also does not describe behavioral characteristics worrisome for Lewy body disease. Given his wife's report of personality changes, I cannot rule out a rare frontal variant of Alzheimer's disease. However, current performances do not suggest strong evidence for prominent cognitive deterioration over time in expected domains. Personality changes may simply be related to marital discord  and frustration due to ongoing hearing loss.    Recommendations: If there is desire for greater diagnostic clarity surrounding underlying Alzheimer's disease pathology, Mr. Cueto could discuss a referral for an lumbar puncture or FDG-PET scan with Ms. Wertman.  A repeat neuropsychological evaluation in 18-24 months is recommended to assess the trajectory of future cognitive decline should it occur.  A combination of  medication and psychotherapy has been shown to be most effective at treating symptoms of anxiety and depression. As such, Mr. Lard is encouraged to speak with his prescribing physician regarding medication adjustments to optimally manage these symptoms. Likewise, Mr. Koloski is encouraged to consider engaging in short-term psychotherapy to address symptoms of psychiatric distress. He would benefit from an active and collaborative therapeutic environment, rather than one purely supportive in nature. Recommended treatment modalities include Cognitive Behavioral Therapy (CBT) or Acceptance and Commitment Therapy (ACT).  Performance across neurocognitive testing is not a strong predictor of an individual's safety operating a motor vehicle. Should his family wish to pursue a formalized driving evaluation, they could reach out to the following agencies: [employer] in [place]: [phone] [facility]: [phone] [facility] [phone] [facility]: [phone] or [phone]  Should there be progression of current deficits over time, Mr. Flatt is unlikely to regain any independent living skills lost. Therefore, it is recommended that he remain as involved as possible in all aspects of household chores, finances, and medication management, with supervision to ensure adequate performance. He will likely benefit from the establishment and maintenance of a routine in order to maximize his functional abilities over time.  If not  already done, Mr. Schanz and his family may want to discuss his wishes regarding durable power of attorney and medical decision making, so that he can have input into these choices. If they require legal assistance with this, long-term care resource access, or other aspects of estate planning, they could reach out to [employer] at [phone] for a free consultation.  Mr. Bayona is encouraged to attend to lifestyle factors for brain health (e.g., regular physical exercise, good nutrition habits and consideration of the MIND-DASH diet, regular participation in cognitively-stimulating activities, and general stress management techniques), which are likely to have benefits for both emotional adjustment and cognition. In fact, in addition to promoting good general health, regular exercise incorporating aerobic activities (e.g., brisk walking, jogging, cycling, etc.) has been demonstrated to be a very effective treatment for depression and stress, with similar efficacy rates to both antidepressant medication and psychotherapy. Optimal control of vascular risk factors (including safe cardiovascular exercise and adherence to dietary recommendations) is encouraged. Continued participation in activities which provide mental stimulation and social interaction is also recommended.   If interested, there are some activities which have therapeutic value and can be useful in keeping him cognitively stimulated. For suggestions, Mr. Zawistowski is encouraged to go to the following website: https://www.[facility].org/get-to-know-barrow/centers-programs/neurorehabilitation-center/neuro-rehab-apps-and-games/ which has options, categorized by level of difficulty. It should be noted that these activities should not be viewed as a substitute for therapy.  Memory can be improved using internal strategies such as rehearsal, repetition, chunking, mnemonics, association, and imagery. External strategies such as written notes  in a consistently used memory journal, visual and nonverbal auditory cues such as a calendar on the refrigerator or appointments with alarm, such as on a cell phone, can also help maximize recall.    When learning new information, he would benefit from information being broken up into small, manageable pieces. He may also find it helpful to articulate the material in his own words and in a context to promote encoding at the onset of a new task. This material may need to be repeated multiple times to promote encoding. He may understand and retain new information better if it is presented to him in a meaningful or well-organized manner at the outset, such as grouping items into  meaningful categories or presenting information in an outlined, bulleted, or story format.  To address problems with processing speed, he may wish to consider:   -Ensuring that he is alerted when essential material or instructions are being presented   -Adjusting the speed at which new information is presented   -Allowing for more time in comprehending, processing, and responding in conversation   -Repeating and paraphrasing instructions or conversations aloud  To address problems with fluctuating attention and/or executive dysfunction, he may wish to consider:   -Avoiding external distractions when needing to concentrate   -Limiting exposure to fast paced environments with multiple sensory demands   -Writing down complicated information and using checklists   -Attempting and completing one task at a time (i.e., no multi-tasking)   -Verbalizing aloud each step of a task to maintain focus   -Taking frequent breaks during the completion of steps/tasks to avoid fatigue   -Reducing the amount of information considered at one time   -Scheduling more difficult activities for a time of day where he is usually most alert  Review of Records:   Mr. Fikes completed a comprehensive neuropsychological evaluation Koleen Distance, Psy.D.) on 04/27/2018. "Results of cognitive testing were within normal limits, with many areas of function in the high average to very superior range, consistent with estimated premorbid intellectual baseline. He demonstrated borderline Impairment on one task only, a task of visual recall. Based on history and overall test performances, I don't suspect this single atypical score is of clinical significance."  Mr. Kray completed a repeat neuropsychological evaluation Clayborn Heron, Psy.D.) on 05/08/2020. Results suggested "performance and presentation on assessment today were consistent with superior overall cognitive ability and average range memory performance, which is normal for you. You did have some mild difficulty on a list learning task and a trend towards slightly weaker performance, but I remain unconvinced that this reaches the threshold necessary for a diagnosis of neurocognitive disorder. Adjustment difficulties related to your medical issues, marital stress, and hearing loss may be responsible for the day-to-day changes that you notice."  Mr. Nicholson was seen by [facility] Marlowe Kays, PA-C) on 04/15/2023 for an evaluation of memory loss. At that time, memory decline was said to be present for 2-3 years, "maybe longer" per his wife. Mr. Willmore described difficulties recalling novel information, instructions, names, and some details of recent conversations. His wife noted that he is no longer as analytical or organized relative to his past. She also noted some personality changes surrounding increased frustration and irritability. Functionally, his wife is in charge of finances and bill paying. Performance on a brief screening instrument (MOCA) was 27/30. Ultimately, Mr. Postlewaite was referred for a repeat neuropsychological evaluation to characterize his cognitive abilities and to assist with diagnostic clarity and treatment planning.   Neuroimaging: Brain MRI on  05/08/2023 suggested fairly numerous chronic microhemorrhages within the supratentorial brain (with a lobar predominance) likely secondary to cerebral amyloid angiopathy. Additionally, there were foci of chronic hemosiderin deposition scattered along the bilateral cerebral hemispheres consistent with sequelae of a remote subarachnoid hemorrhage. Chronic microhemorrhages were also noted within the cerebellar vermis and right cerebellar hemisphere. This was noted in the presence of background mild microvascular ischemic disease and mild generalized atrophy.   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 cad,  normal LVF,  ef 60%,  false positive myoview 01-26-2008  dr Anne Fu   GREEN LIGHT LASER TURP (TRANSURETHRAL RESECTION OF PROSTATE N/A 06/13/2015   Procedure: GREEN LIGHT LASER TURP (TRANSURETHRAL RESECTION OF PROSTATE;  Surgeon: Jerilee Field, MD;  Location: [facility];  Service: Urology;  Laterality: N/A;   HERNIA REPAIR  infant   KNEE ARTHROSCOPY W/ MENISCECTOMY Bilateral left  10-04-2008/  right 11-09-2007   and chondraplasty/  synovectomy   TRANSURETHRAL RESECTION OF PROSTATE N/A 05/18/2019   Procedure: TRANSURETHRAL RESECTION OF THE PROSTATE (TURP);  Surgeon: Jerilee Field, MD;  Location: [facility];  Service: Urology;  Laterality: N/A;   TRANSURETHRAL RESECTION OF PROSTATE N/A 03/19/2022   Procedure: CYSTOSCOPY/  TRANSURETHRAL RESECTION OF THE PROSTATE (TURP) RESIDUAL;  Surgeon: Jerilee Field, MD;  Location: [facility];  Service: Urology;  Laterality: N/A;   VASECTOMY  1980's    Current Outpatient Medications:    AREXVY 120 MCG/0.5ML injection, , Disp: , Rfl:    aspirin 81 MG tablet, Take 1 tablet (81 mg total) by mouth daily., Disp: 30 tablet, Rfl:    Cholecalciferol (VITAMIN D3 PO), Take 1,000 Units by mouth daily., Disp: , Rfl:    Cyanocobalamin 250 MCG LOZG, Take 250 mcg by mouth daily., Disp: , Rfl:    donepezil (ARICEPT) 10 MG tablet, Take half tablet (5 mg) daily for 2 weeks, then increase to the full tablet at 10 mg daily, Disp: 30 tablet, Rfl: 11   hyoscyamine (LEVSIN SL) 0.125 MG SL tablet, DISSOLVE 1 TABLET UNDER TONGUE 4 TIMES A DAY AS NEEDED (Patient not taking: Reported on 05/21/2022), Disp: , Rfl:    mirtazapine (REMERON) 7.5 MG tablet, 1 tablet before bedtime for one week then increase to 2 tabs Orally Once a day for 30 days, Disp: , Rfl:    rosuvastatin (CRESTOR) 10 MG tablet, TAKE 1 TABLET BY MOUTH EVERY DAY, Disp: 90 tablet, Rfl: 0   tamsulosin (FLOMAX) 0.4 MG CAPS capsule, 1 capsule Orally as needed (Patient not taking: Reported on 04/15/2023), Disp: , Rfl:    zolpidem (AMBIEN) 10 MG tablet, 1 TABLET AT BEDTIME AS NEEDED FOR SLEEP ORALLY (Patient not taking: Reported on 05/21/2022), Disp: , Rfl: 0  Clinical Interview:   The following information was obtained during a clinical interview with Mr. Schramm and his wife prior to cognitive testing.  Cognitive Symptoms: Decreased short-term memory: Endorsed. Mr. Delillo described his memory as "not as good as it used to be," noting trouble recalling names and misplacing things in his environment. His wife expressed more prominent concerns, largely surrounding rapid forgetting and increased repetition in day-to-day interactions. Both he and his wife reported concern for progressive decline over time, especially since his  previous 2021 neuropsychological evaluation.  Decreased long-term memory: Denied. Decreased attention/concentration: Denied. His wife reported notable difficulty with sustained focus.  Reduced processing speed: Endorsed. Difficulties with executive functions: Endorsed "probably." His wife reported fairly prominent difficulties surrounding organization and multi-tasking. Specific to the latter, she noted that he needs things presented one at  a time else he may forget subsequent steps in a process. They denied trouble with impulsivity.  Difficulties with emotion regulation: Denied. His wife reported fairly prominent personality changes, largely surrounding Mr. Kann having a much shorter fuse and being far more easily irritated and agitated. Some of this appears to be longstanding in nature. However, she likened more recent behaviors to him no longer having a filter and seeming less aware surrounding verbal outbursts.  Difficulties with receptive language: Denied. Difficulties with word finding: Denied. His wife reported ongoing word finding difficulties.  Decreased visuoperceptual ability: Denied.  Difficulties completing ADLs: Somewhat. He manages medications appropriately and drives locally without issue. His wife has taken over financial management and bill paying responsibilities for the past few years after realizing that he was having difficulty paying bills. She also noted being uncomfortable with Mr. Carignan driving longer distances at highway speeds and will perform all of these driving actions.   Additional Medical History: History of traumatic brain injury/concussion: Denied. History of stroke: Denied. History of seizure activity: Denied. History of known exposure to toxins: Denied. Symptoms of chronic pain: He has undergone bilateral knee replacement procedures and his left knee continues to cause some degree of regular pain. There was also report of occasional muscle pain involving  his back.  Experience of frequent headaches/migraines: Denied. Frequent instances of dizziness/vertigo: Denied.  Sensory changes: He is hard of hearing and uses hearing aids with some benefit. However, he continues to have significant difficulty hearing while in crowded rooms. This has directly increased frustration and resulted in greater social isolation. He wears glasses with benefit. Other sensory changes/difficulties (e.g., taste or smell) were not reported.  Balance/coordination difficulties: He described ongoing balance instability, one side not worse than the other. He was unsure if this was related to prior knee replacement procedures, neuropathy in his feet, a neurological cause, or a combination of several factors. He uses a cane for added stability. No recent falls were reported.  Other motor difficulties: Denied.  Sleep History: Estimated hours obtained each night: 8 hours.  Difficulties falling asleep: Denied. Difficulties staying asleep: Denied. Feels rested and refreshed upon awakening: Endorsed.  History of snoring: Endorsed. History of waking up gasping for air: Denied. Witnessed breath cessation while asleep: Denied.  History of vivid dreaming: Denied. Excessive movement while asleep: Denied. Instances of acting out his dreams: Denied.  Psychiatric/Behavioral Health History: Depression: He described his current mood as being average and denied to his knowledge any prior mental health concerns or formal diagnoses. His wife expressed concern that he is dealing with symptoms of depression he either does not realize this or does not wish to disclose. Current or remote suicidal ideation, intent, or plan was denied. Medical records do suggest a history of Mr. Ghee and his wife engaging in couples counseling in the past. Anxiety: Denied. Mania: Denied. Trauma History: Denied. Visual/auditory hallucinations: Denied. Delusional thoughts: Denied.  Tobacco: Denied. Alcohol:  He reported very rare alcohol consumption and denied a history of problematic alcohol abuse or dependence.  Recreational drugs: Denied.  Family History: Problem Relation Age of Onset   Cancer Mother    Diabetes Mother    Coronary artery disease Mother    Cancer Father    Heart attack Father    Alcoholism Father    Coronary artery disease Brother    Diabetes Brother    Dementia Brother        younger brother   This information was confirmed by Mr. Pienta.  Academic/Vocational History:  Highest level of educational attainment: 18 years. He earned an Set designer from [employer] and described himself as a Designer, multimedia throughout academic settings. No relative weaknesses were identified.  History of developmental delay: Denied. History of grade repetition: Denied. Enrollment in special education courses: Denied. History of LD/ADHD: Denied.  Employment: Retired. 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   Evaluation Results:   Behavioral Observations: Mr. Ramcharan was accompanied by his wife, arrived to his appointment on time, and was appropriately dressed and groomed. He appeared alert. He ambulated with a cane for added stability. He maneuvered with this device well. Gross motor functioning appeared intact upon informal observation and no abnormal movements (e.g., tremors) were noted. His affect was generally relaxed and positive. Spontaneous speech was fluent and word finding difficulties were not observed during the clinical interview. Thought processes were coherent, organized, and normal in content. Insight into his cognitive difficulties appeared adequate.   During testing, sustained attention was appropriate. Task engagement was adequate and he persisted when challenged. Hearing loss was noted throughout testing and has the potential to have impacted testing. Overall, Mr. Delisle was cooperative with the  clinical interview and subsequent testing procedures.   Adequacy of Effort: The validity of neuropsychological testing is limited by the extent to which the individual being tested may be assumed to have exerted adequate effort during testing. Mr. Clabo expressed his intention to perform to the best of his abilities and exhibited adequate task engagement and persistence. Scores across stand-alone and embedded performance validity measures were within expectation. As such, the results of the current evaluation are believed to be a valid representation of Mr. Dilks current cognitive functioning.  Test Results: Mr. Landrigan was fully oriented at the time of the current evaluation.  Intellectual abilities based upon educational and vocational attainment were estimated to be in the above average range. Premorbid abilities were estimated to be within the well above average range based upon a single-word reading test.   Processing speed was below average to average. Basic attention was average. More complex attention (e.g., working memory) was above average. Executive functioning was below average to average.  Assessed receptive language abilities were above average. Likewise, Mr. Rodkey did not exhibit any difficulties comprehending task instructions and answered all questions asked of him appropriately. Assessed expressive language was variable. Phonemic fluency was well below average to below average, semantic fluency was above average to well above average, and confrontation naming was above average.      Assessed visuospatial/visuoconstructional abilities were average to well above average.    Learning (i.e., encoding) of novel verbal information was average to above average. Spontaneous delayed recall (i.e., retrieval) of previously learned information was variable, ranging from the well below average to average normative ranges. Retention rates were 71% across a list learning task, 83%  across a story learning task, 56% across a daily living task, and 45% across a figure drawing task. Performance across recognition tasks was average to above average, suggesting evidence for information consolidation.   Results of emotional screening instruments suggested that recent symptoms of generalized anxiety were in the minimal range, while symptoms of depression were within the mild range. A screening instrument assessing recent sleep quality suggested the presence of minimal sleep dysfunction.  Tables of Scores:   Note: This summary of test scores accompanies the interpretive report and should not be considered in isolation without reference to the appropriate  sections in the text. Descriptors are based on appropriate normative data and may be adjusted based on clinical judgment. Terms such as "Within Normal Limits" and "Outside Normal Limits" are used when a more specific description of the test score cannot be determined. Descriptors refer to the current evaluation only.        Percentile - Normative Descriptor > 98 - Exceptionally High 91-97 - Well Above Average 75-90 - Above Average 25-74 - Average 9-24 - Below Average 2-8 - Well Below Average < 2 - Exceptionally Low        Validity: November 2021 Current  DESCRIPTOR        DCT: --- --- --- Within Normal Limits  NAB EVI: --- --- --- Within Normal Limits        Orientation:       Raw Score Raw Score Percentile   NAB Orientation, Form 1 --- 29/29 --- ---        Cognitive Screening:       Raw Score Raw Score Percentile   SLUMS: --- 23/30 --- ---        Intellectual Functioning:       Standard Score Standard Score Percentile   Test of Premorbid Functioning: --- 123 94 Well Above Average        Memory:      NAB Memory Module, Form 1: T Score T Score Percentile   List Learning        Total Trials 1-3 17/36 (38) 23/36 (52) 58 Average    List B 7/12 (64) 4/12 (49) 46 Average    Short Delay Free Recall 5/12 (39) 7/12 (50)  50 Average    Long Delay Free Recall 4/12 (35) 5/12 (42) 21 Below Average    Retention Percentage 80 71 (43) 25 Average    Recognition Discriminability 3 7 (53) 62 Average  Story Learning        Immediate Recall 60/80 (49) 70/80 (61) 86 Above Average    Delayed Recall 31/40 (48) 33/40 (53) 62 Average    Retention Percentage 84 83 (49) 46 Average  Daily Living Memory        Immediate Recall 40/51 (74) 44/51 (55) 69 Average    Delayed Recall 16/17 (62) 9/17 (35) 7 Well Below Average    Retention Percentage 94 56 (30) 2 Well Below Average    Recognition Hits 8/10 10/10 (61) 86 Above Average         Raw Score (Scaled Score) Raw Score (Scaled Score) Percentile   RBANS Figure Copy: 20/20 (14) 20/20 (14) 91 Well Above Average  RBANS Figure Recall: 5/20 (5) 9/20 (8) 25 Average        Attention/Executive Function:      Trail Making Test (TMT): T Score Raw Score (T Score) Percentile     Part A 52 50 secs.,  1 error (39) 14 Below Average    Part B 51 115 secs.,  1 error (42) 21 Below Average          Scaled Score Scaled Score Percentile   RBANS Coding: 8 --- --- ---         Scaled Score Scaled Score Percentile   WAIS-IV Coding: --- 7 16 Below Average        NAB Attention Module, Form 1: T Score T Score Percentile     Digits Forward 69 54 66 Average    Digits Backwards 61 59 82 Above Average         Scaled Score  Scaled Score Percentile   WAIS-IV Similarities: --- 6 9 Below Average        D-KEFS Color-Word Interference Test: Raw Score (Scaled Score) Raw Score (Scaled Score) Percentile     Color Naming --- 35 secs. (9) 37 Average    Word Reading --- 26 secs. (10) 50 Average    Inhibition --- 79 secs. (9) 37 Average      Total Errors --- 3 errors (10) 50 Average    Inhibition/Switching --- 87 secs. (9) 37 Average      Total Errors --- 4 errors (9) 37 Average        D-KEFS Verbal Fluency Test: Raw Score (Scaled Score) Raw Score (Scaled Score) Percentile     Letter Total Correct  --- 23 (6) 9 Below Average    Category Total Correct --- 39 (12) 75 Above Average    Category Switching Total Correct --- 11 (9) 37 Average    Category Switching Accuracy --- 9 (8) 25 Average      Total Set Loss Errors --- 2 (10) 50 Average      Total Repetition Errors --- 3 (10) 50 Average        Language:      Verbal Fluency Test: T Score Raw Score (T Score) Percentile     Phonemic Fluency (FAS) 41 23 (33) 5 Well Below Average    Animal Fluency 56 27 (63) 91 Well Above Average         NAB Language Module, Form 1: T Score T Score Percentile     Auditory Comprehension --- 57 75 Above Average    Naming 31/31 (56) 31/31 (59) 82 Above Average        Visuospatial/Visuoconstruction:       Raw Score Raw Score Percentile   Clock Drawing: 10/10 10/10 --- Within Normal Limits         Scaled Score Scaled Score Percentile   WAIS-IV Block Design: --- 14 91 Well Above Average        Mood and Personality:       Raw Score Raw Score Percentile   Geriatric Depression Scale: --- 13 --- Mild  Geriatric Anxiety Scale: --- 8 --- Minimal    Somatic --- 1 --- Minimal    Cognitive --- 2 --- Minimal    Affective --- 5 --- Mild        Additional Questionnaires:       Raw Score Raw Score Percentile   PROMIS Sleep Disturbance Questionnaire: --- 15 --- None to Slight   Informed Consent and Coding/Compliance:   The current evaluation represents a clinical evaluation for the purposes previously outlined by the referral source and is in no way reflective of a forensic evaluation.   Mr. Sedivy was provided with a verbal description of the nature and purpose of the present neuropsychological evaluation. Also reviewed were the foreseeable risks and/or discomforts and benefits of the procedure, limits of confidentiality, and mandatory reporting requirements of this provider. The patient was given the opportunity to ask questions and receive answers about the evaluation. Oral consent to participate was provided  by the patient.   This evaluation was conducted by Newman Nickels, Ph.D., ABPP-CN, board certified clinical neuropsychologist. Mr. Bernhart completed a clinical interview with Dr. Milbert Coulter, billed as one unit [phone], and 125 minutes of cognitive testing and scoring, billed as one unit [phone] and three additional units [id number]. Psychometrist Shan Levans, B.S. assisted Dr. Milbert Coulter with test administration and scoring procedures. As a separate and  discrete service, one unit [id number] and two units [phone] were billed for Dr. Tammy Sours time spent in interpretation and report writing.

## 2023-08-11 NOTE — Progress Notes (Signed)
   Psychometrician Note   Cognitive testing was administered to Nicholas Mahoney by Shan Levans, B.S. (psychometrist) under the supervision of Dr. Newman Nickels, Ph.D., ABPP, licensed psychologist on 08/11/2023. Nicholas Mahoney did not appear overtly distressed by the testing session per behavioral observation or responses across self-report questionnaires. Rest breaks were offered.    The battery of tests administered was selected by Dr. Newman Nickels, Ph.D., ABPP with consideration to Nicholas Mahoney current level of functioning, the nature of his symptoms, emotional and behavioral responses during interview, level of literacy, observed level of motivation/effort, and the nature of the referral question. This battery was communicated to the psychometrist. Communication between Dr. Newman Nickels, Ph.D., ABPP and the psychometrist was ongoing throughout the evaluation and Dr. Newman Nickels, Ph.D., ABPP was immediately accessible at all times. Dr. Newman Nickels, Ph.D., ABPP provided supervision to the psychometrist on the date of this service to the extent necessary to assure the quality of all services provided.    Nicholas Mahoney will return within approximately 1-2 weeks for an interactive feedback session with Dr. Milbert Coulter at which time his test performances, clinical impressions, and treatment recommendations will be reviewed in detail. Nicholas Mahoney understands he can contact our office should he require our assistance before this time.  A total of 125 minutes of billable time were spent face-to-face with Nicholas Mahoney by the psychometrist. This includes both test administration and scoring time. Billing for these services is reflected in the clinical report generated by Dr. Newman Nickels, Ph.D., ABPP  This note reflects time spent with the psychometrician and does not include test scores or any clinical interpretations made by Dr. Milbert Coulter. The full report will follow in a separate note.

## 2023-08-18 ENCOUNTER — Ambulatory Visit: Payer: Medicare Other | Admitting: Physical Therapy

## 2023-08-26 ENCOUNTER — Ambulatory Visit: Payer: Medicare Other | Admitting: Physical Therapy

## 2023-08-28 ENCOUNTER — Encounter: Payer: Medicare Other | Admitting: Psychology

## 2023-08-29 ENCOUNTER — Ambulatory Visit (INDEPENDENT_AMBULATORY_CARE_PROVIDER_SITE_OTHER): Payer: Medicare Other | Admitting: Psychology

## 2023-08-29 DIAGNOSIS — G3184 Mild cognitive impairment, so stated: Secondary | ICD-10-CM | POA: Diagnosis not present

## 2023-08-29 NOTE — Progress Notes (Signed)
   Neuropsychology Feedback Session [name]. [facility] Department of Neurology  Reason for Referral:   Nicholas Mahoney is a 78 y.o. right-handed Caucasian male referred by Marlowe Kays, PA-C, to characterize his current cognitive functioning and assist with diagnostic clarity and treatment planning in the context of subjective cognitive decline.   Feedback:   Mr. Foot completed a comprehensive neuropsychological evaluation on 08/11/2023. Please refer to that encounter for the full report and recommendations. Briefly, results suggested an isolated impairment across phonemic fluency. Additional performance variability was exhibited across a daily living verbal memory task. However, performances across three other memory tasks were appropriate and not suggestive of ongoing impairment. A relative weakness could be argued across processing speed and executive functioning given high premorbid intellectual estimations. However, these domains do not represent normative impairments currently. Relative to his previous evaluation in November 2021, [name] generally exhibited stability. His only measurable decline surrounded phonemic fluency. The argument could be made for very subtle decline across processing speed, basic attention, and cognitive flexibility. However, the subtle nature should be emphasized as these changes may not be clinically significant. Specific to memory, some decline was observed across a daily living task, particularly surrounding delayed spontaneous recall. However, all other memory tests were stable or exhibited subtle improvements. The cause for isolated weaknesses remains uncertain. Recent neuroimaging suggested fairly numerous chronic microhemorrhages within the supratentorial brain, additional foci of chronic hemosiderin deposition scattered along the bilateral cerebral hemispheres consistent with sequelae of a remote subarachnoid hemorrhage, and chronic  microhemorrhages within the cerebellar vermis and right cerebellar hemisphere. 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 This was noted in the presence of background mild microvascular ischemic disease and mild generalized atrophy. There certainly remains the potential for neuroimaging findings to represent the cause for ongoing dysfunction, exacerbated by longstanding hearing loss, marital discord, and mild depression (based upon his responses across a related mood questionnaire). Neurologically speaking, current memory patterns and stability over time is not suggestive of typically presenting symptomatic Alzheimer's disease. However, neuroimaging raising concerns for cerebral amyloid angiopathy do increase risk for this underlying disease process and ongoing monitoring will be important moving forward.   [name] was accompanied by his wife during the current feedback session. Content of the current session focused on the results of his neuropsychological evaluation. [name] was given the opportunity to ask questions and his questions were answered. He was encouraged to reach out should additional questions arise. A copy of his report was provided at the conclusion of the visit.      One unit [phone] was billed for Dr. Tammy Sours time spent preparing for, conducting, and documenting the current feedback session with [name].

## 2023-09-01 ENCOUNTER — Encounter: Payer: Self-pay | Admitting: *Deleted

## 2023-09-01 ENCOUNTER — Ambulatory Visit: Payer: Medicare Other | Attending: Family Medicine | Admitting: Physical Therapy

## 2023-09-01 ENCOUNTER — Encounter: Payer: Self-pay | Admitting: Physical Therapy

## 2023-09-01 DIAGNOSIS — R2681 Unsteadiness on feet: Secondary | ICD-10-CM | POA: Insufficient documentation

## 2023-09-01 DIAGNOSIS — R262 Difficulty in walking, not elsewhere classified: Secondary | ICD-10-CM | POA: Insufficient documentation

## 2023-09-01 DIAGNOSIS — M6281 Muscle weakness (generalized): Secondary | ICD-10-CM | POA: Insufficient documentation

## 2023-09-01 NOTE — Therapy (Signed)
 OUTPATIENT PHYSICAL THERAPY THORACOLUMBAR TREATMENT   Patient Name: Nicholas Mahoney MRN: [id number] DOB:[birth year], 78 y.o., male Today's Date: 09/01/2023  END OF SESSION:  PT End of Session - 09/01/23 0842     Visit Number 6    Date for PT Re-Evaluation 09/29/23    Authorization Type Medicare    PT Start Time 0844    PT Stop Time 0930    PT Time Calculation (min) 46 min    Activity Tolerance Patient tolerated treatment well    Behavior During Therapy [facility] for tasks assessed/performed             Past Medical History:  Diagnosis Date   BPH (benign prostatic hyperplasia)    ED (erectile dysfunction)    Hereditary hemochromatosis 04/10/2017   History of COVID-19 12/2020   asymptomatic   History of total knee replacement, bilateral    Hyperlipidemia    Lower urinary tract symptoms (LUTS)    Mild cognitive impairment of uncertain or unknown etiology 08/11/2023   Osteoarthritis of left knee 01/24/2020   Pain in joint of left knee 05/22/2020   Vitamin D deficiency    Wears glasses    Wears hearing aid    bilateral   Past Surgical History:  Procedure Laterality Date   APPENDECTOMY  1980's   CARDIAC CATHETERIZATION     abnormal myoview/  no sig. cad,  normal LVF,  ef 60%,  false positive myoview 01-26-2008  dr Anne Fu   GREEN LIGHT LASER TURP (TRANSURETHRAL RESECTION OF PROSTATE N/A 06/13/2015   Procedure: GREEN LIGHT LASER TURP (TRANSURETHRAL RESECTION OF PROSTATE;  Surgeon: Jerilee Field, MD;  Location: [facility];  Service: Urology;  Laterality: N/A;   HERNIA REPAIR  infant   KNEE ARTHROSCOPY W/ MENISCECTOMY Bilateral left  10-04-2008/  right 11-09-2007   and chondraplasty/  synovectomy   TRANSURETHRAL RESECTION OF PROSTATE N/A 05/18/2019   Procedure: TRANSURETHRAL RESECTION OF THE PROSTATE (TURP);  Surgeon: Jerilee Field, MD;  Location: [facility];  Service: Urology;  Laterality: N/A;   TRANSURETHRAL RESECTION OF PROSTATE N/A  03/19/2022   Procedure: CYSTOSCOPY/ TRANSURETHRAL RESECTION OF THE PROSTATE (TURP) RESIDUAL;  Surgeon: Jerilee Field, MD;  Location: [facility];  Service: Urology;  Laterality: N/A;   VASECTOMY  1980's   Patient Active Problem List   Diagnosis Date Noted   Mild cognitive impairment of uncertain or unknown etiology 08/11/2023   BPH (benign prostatic hyperplasia)    ED (erectile dysfunction)    Hyperlipidemia    Lower urinary tract symptoms (LUTS)    Vitamin D deficiency    Wears hearing aid    History of total knee replacement, bilateral    Pain in joint of left knee 05/22/2020   Osteoarthritis of left knee 01/24/2020   Hereditary hemochromatosis 04/10/2017    PCP: Gweneth Dimitri, MD  REFERRING PROVIDER: Corliss Blacker, MD  REFERRING DIAG: poor balance  Rationale for Evaluation and Treatment: Rehabilitation  THERAPY DIAG:  Muscle weakness (generalized)  Difficulty in walking, not elsewhere classified  Unsteadiness on feet  ONSET DATE: 05/08/23  SUBJECTIVE:  SUBJECTIVE STATEMENT: Denies falls, reports still going to gym and trying to walk dog daily, saw neurologist last week  PERTINENT HISTORY:  Bilateral TKA  PAIN:  Are you having pain? Yes: NPRS scale: 2/10 Pain location: left knee pain Pain description: tired, and ache Aggravating factors: walking without cane, steps Relieving factors: using cane helps, rest  PRECAUTIONS: Fall  RED FLAGS: None   WEIGHT BEARING RESTRICTIONS: No  FALLS:  Has patient fallen in last 6 months? No and reports stumbles and near falls  LIVING ENVIRONMENT: Lives with: lives with their family Lives in: House/apartment Stairs: Yes: Internal: 12 steps; can reach both Has following equipment at home: Single point cane  OCCUPATION:  retired Psychologist, occupational  PLOF: Independent and has started using a cane for most walking  PATIENT GOALS: walking faster, feeling more stable, possibly walk longer less use of the cane  NEXT MD VISIT: 6 months  OBJECTIVE:  Note: Objective measures were completed at Evaluation unless otherwise noted.  DIAGNOSTIC FINDINGS:  none COGNITION: Overall cognitive status: Within functional limits for tasks assessed     SENSATION: Lower legs are cool to the touch  MUSCLE LENGTH: Very tight HS, calves, ITB, piriformis  POSTURE: rounded shoulders, forward head, and decreased lumbar lordosis  PALPATION: LE's lower leg are cool to touch  LUMBAR ROM:   AROM eval  Flexion Decreased 25%  Extension Decreased 100%  Right lateral flexion Decreased 50%  Left lateral flexion Decreased 50%  Right rotation   Left rotation    (Blank rows = not tested)  LOWER EXTREMITY ROM:     Active  Right eval Left eval  Hip flexion    Hip extension    Hip abduction    Hip adduction    Hip internal rotation    Hip external rotation    Knee flexion    Knee extension 4 12  Ankle dorsiflexion    Ankle plantarflexion    Ankle inversion    Ankle eversion     (Blank rows = not tested)  LOWER EXTREMITY MMT:    MMT Right eval Left eval Right/left 07/16/23  Hip flexion 4 4   Hip extension 4 4   Hip abduction 4 4   Hip adduction     Hip internal rotation     Hip external rotation     Knee flexion 4 4   Knee extension 4- 4-   Ankle dorsiflexion 4- 3+ 4-/3+  Ankle plantarflexion   4-  Ankle inversion   4-  Ankle eversion   3+   (Blank rows = not tested)  FUNCTIONAL TESTS:  BERG 14/56, was 47/56 In September 2023 he scored 47/56   TUG 17 seconds, needs hands to get up, no device uncontrolled sit   3 minute walk test 500 feet no device, occasionally would use wall or  Furniture to balance GAIT: Distance walked: see above Assistive device utilized: Single point cane and None Level of assistance:  SBA Comments: very unsteady, but if he is moving he does better, currently needs a cane, if he is standing without walking he has to be in motion to find his balance and is always fighting his balance  TODAY'S TREATMENT:  DATE:   09/01/23 Tmill pushes 3 x 20 s Minitramp bounce and march 20s x 3 each [employer], volleyball On airex number reaching On rocker board 2 ways , balance and reaching On upside down bosu balance  Resisted gait 40# all directions Walking ball toss Tug 12 seconds Side stepping over stick 12" toe touches  07/30/23 Discussed the correspondence with the MD office 40# resisted gait all directions On slant board two ways reaching for #'s on wall Side stepping over sticks Walking over sticks trying to have longer stride Back to wall butt and shoulder blade touches Facing wall head and tried pelvic touches Sit-stand-throw-catch-sit with 6.6# ball Sit to stand with reaching overhead with 6.6# ball Standing ball toss looking up  07/23/23 Bike level 5 x 6 minutes Back to wall butt and scapular touches In pbars touching numbers on wall and then doing this on the airex Walking ball toss Standing on slant board two ways working on balance Cone toe touch Standing on slant board two ways reaching for numbers on wall Direction changes with walking Tandem walk  07/15/22 Nustep level 6 x 6 minutes LE only In pbars step turn touch On airex touching numbers on wall with some cognitive math  Side step on and off airex [employer], volleyball Ball kicks Side stepping over sticks Then walking without change of pace over sticks Tested ankle DF and eversion in box above, very weak He had a lot of questions about his condition noted below  07/07/23 Nustep level 5 x 4 minutes Gait outside with walking stick up and down hill Butt touches to wall and  then scapula touches On airex ball toss Step turn touch  Side step on and off airex Walking ball toss 40# resisted gait all directions Side step over stick 8" toe touches On airex facing wall reaching Off airex reaching   PATIENT EDUCATION:  Education details: POC/HEP Person educated: Patient Education method: Explanation Education comprehension: verbalized understanding  HOME EXERCISE PROGRAM: Access Code: [email or web address] URL: [email or web address] Date: 06/10/2023 Prepared by: Stacie Glaze  Exercises - Standing Marching  - 1 x daily - 7 x weekly - 2 sets - 10 reps - 3 hold - Standing Hip Abduction  - 1 x daily - 7 x weekly - 2 sets - 10 reps - 3 hold - Standing Hip Extension with Chair  - 1 x daily - 7 x weekly - 2 sets - 10 reps - 3 hold  ASSESSMENT:  CLINICAL IMPRESSION: Patient is a 78 y.o. male who was seen today for physical therapy evaluation and treatment for balance issues, and unsteady gait.  He scored a 14/56 on the BERG and last year in September this score was 47/56. He really struggles to stand, tends to constantly be moving and adjusting his balance.  If he is moving he does well, if he is touching something like the wall or has hand on furniture he does well.  He denies neuropathy but does have cold lower legs, I tested the ankle strength and he is weak with DF and inversion.  I continue to add different exercises to work on balance, he tends to be back on his heels,He improved his TUG time by 5 seconds, on all activities today he did well, needing to use Pbars at times to right self but never felt like he was going to fall, he does seem to be moving better and with some more confidence.  OBJECTIVE IMPAIRMENTS: Abnormal gait, cardiopulmonary status limiting activity, decreased  activity tolerance, decreased balance, decreased coordination, decreased endurance, decreased mobility, difficulty walking, decreased ROM, decreased strength, increased fascial  restrictions, impaired perceived functional ability, increased muscle spasms, impaired flexibility, improper body mechanics, postural dysfunction, and pain.   REHAB POTENTIAL: Good  CLINICAL DECISION MAKING: Stable/uncomplicated  EVALUATION COMPLEXITY: Low   GOALS: Goals reviewed with patient? Yes  SHORT TERM GOALS: Target date: 07/10/23  Independent with initial HEP Goal status: met 07/07/23  LONG TERM GOALS: Target date: 09/07/23  Independent with advanced HEP Goal status: progressing 09/01/23  2.  Increase BERG balance test score to 40/56 Goal status: progressing 09/01/23  3.  Increase 3 minute walk distance to 700 feet Goal status: met 09/01/23  4.  Decrease TUG to 15 seconds Goal status: met 09/01/23  5.  Decrease 5XSTS to 18 seconds Goal status: ongoing 07/23/23  PLAN:  PT FREQUENCY: 1-2x/week  PT DURATION: 12 weeks  PLANNED INTERVENTIONS: 97164- PT Re-evaluation, 97110-Therapeutic exercises, 97530- Therapeutic activity, 97112- Neuromuscular re-education, 97535- Self Care, 96045- Manual therapy, 97116- Gait training, 97014- Electrical stimulation (unattended), Patient/Family education, Balance training, Stair training, Dry Needling, Cryotherapy, and Moist heat.  PLAN FOR NEXT SESSION: continue to push his balance   Jearld Lesch, PT 09/01/2023, 8:48 AM

## 2023-09-02 ENCOUNTER — Encounter: Payer: Self-pay | Admitting: Cardiology

## 2023-09-02 ENCOUNTER — Ambulatory Visit: Payer: Medicare Other | Attending: Cardiology | Admitting: Cardiology

## 2023-09-02 VITALS — BP 130/100 | HR 61 | Ht 73.0 in | Wt 171.6 lb

## 2023-09-02 DIAGNOSIS — E7800 Pure hypercholesterolemia, unspecified: Secondary | ICD-10-CM | POA: Diagnosis not present

## 2023-09-02 DIAGNOSIS — Z96653 Presence of artificial knee joint, bilateral: Secondary | ICD-10-CM | POA: Insufficient documentation

## 2023-09-02 DIAGNOSIS — I700 Atherosclerosis of aorta: Secondary | ICD-10-CM | POA: Insufficient documentation

## 2023-09-02 NOTE — Progress Notes (Signed)
 Cardiology Office Note:  .   Date:  09/02/2023  ID:  Nicholas Mahoney, DOB February 19, 1946, MRN [id number] PCP: Gweneth Dimitri, MD  [facility] Providers Cardiologist:  None     History of Present Illness: .   Nicholas Mahoney is a 78 y.o. male Discussed with the use of AI scribe software  History of Present Illness   Nicholas Mahoney "Nicholas Mahoney" is a 78 year old male who presents for follow-up of hyperlipidemia.  He has been taking Crestor 10 mg once daily, which has effectively managed his cholesterol levels, with an LDL of 64. He also takes aspirin 81 mg daily. No chest pain and he feels generally fine. His EKG was noted to be normal.  He has a history of a false positive nuclear stress test suggestive of inferior ischemia, but subsequent cardiac catheterization showed significant coronary artery disease with a normal ejection fraction. He had a normal triple A duplex scan in April 2022.  He has a significant family history of coronary artery disease. His father had a myocardial infarction and died at age 29. His older brother underwent PCI for coronary artery disease, and another younger brother has diabetes. His mother had cancer and a myocardial infarction, passing away suddenly in 2009.  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 He now prefers to travel within the [place] and dine in quieter places.  No recent need for phlebotomies for hemochromatosis, as his ferritin levels have remained low.          ROS: No CP, no SOB  Studies Reviewed: Marland Kitchen   EKG Interpretation Date/Time:  Tuesday September 02 2023 09:36:48 EST Ventricular Rate:  61 PR Interval:  150 QRS Duration:  92 QT Interval:  414 QTC Calculation: 416 R Axis:   64  Text Interpretation: Normal sinus rhythm  with sinus arrhythmia Normal ECG When compared with ECG of 09-Nov-2007 09:33, No significant change was found Confirmed by Donato Schultz ([id number]) on 09/02/2023 9:52:35 AM    Results   LABS LDL: 64 Triglycerides: 105 Hemoglobin: 15.3 Creatinine: 0.8 Potassium: 4.8 ALT: 38  RADIOLOGY Abdominal aorta duplex scan: Normal (10/2020)  DIAGNOSTIC EKG: Normal Cardiac catheterization 2009: No significant coronary artery disease and normal ejection fraction Nuclear stress test: False positive suggestive of inferior ischemia Calcium score: Zero     Risk Assessment/Calculations:           Physical Exam:   VS:  BP (!) 130/100 Comment: Left arm 130/90  Pulse 61   Ht 6\' 1"  (1.854 m)   Wt 171 lb 9.6 oz (77.8 kg)   SpO2 96%   BMI 22.64 kg/m    Wt Readings from Last 3 Encounters:  09/02/23 171 lb 9.6 oz (77.8 kg)  06/03/23 176 lb (79.8 kg)  06/02/23 176 lb 1.6 oz (79.9 kg)    GEN: Well nourished, well developed in no acute distress NECK: No JVD; No carotid bruits CARDIAC: RRR, no murmurs, no rubs, no gallops RESPIRATORY:  Clear to auscultation without rales, wheezing or rhonchi  ABDOMEN: Soft, non-tender, non-distended EXTREMITIES:  No edema; No deformity   ASSESSMENT AND PLAN: .    Assessment and Plan    Hyperlipidemia   LDL is well-controlled at 64 mg/dL on Crestor 10 mg daily. No chest pain reported. EKG is normal. Previous calcium score  was zero. Discussed benefits of continuing Crestor and aspirin to maintain cholesterol levels and prevent cardiac events. He agrees to graduate from cardiology clinic with PRN follow-up.   - Continue Crestor 10 mg once daily   - Continue aspirin 81 mg once daily   - Graduate from cardiology clinic with PRN follow-up    Aortic Atherosclerosis   Previously seen on imaging. No current symptoms. Managed with aspirin and statin therapy. Discussed benefits of continuing current medications to manage atherosclerosis and prevent progression.   - Continue  aspirin 81 mg once daily   - Continue Crestor 10 mg once daily    Family History of Coronary Artery Disease   Significant family history of CAD with father and brother affected. He is on appropriate medications and has a normal calcium score. Regular follow-up with primary care physician. Discussed importance of monitoring for new symptoms given family history.   - Continue regular follow-up with primary care physician   - Monitor for new symptoms and report if they occur    Hypertension   Blood pressure elevated in office. No home monitoring reported. Possible white coat hypertension. Discussed importance of home monitoring for accurate readings and treatment adjustment if necessary.   - Obtain electronic blood pressure cuff for home monitoring   - Check blood pressure at home periodically, ideally once every other day   - Record readings to Dr. Darrell Jewel next appt.   Hemochromatosis   Ferritin levels low enough to avoid phlebotomies for at least two years. Managed with regular monitoring. Discussed importance of continued monitoring to prevent complications.   - Continue regular monitoring of ferritin levels   - Follow-up with hematologist as needed    General Health Maintenance   He is generally well with no new complaints. Regular follow-up with primary care and cardiology. Active lifestyle with travel history. Discussed importance of maintaining an active lifestyle within physical limitations.   - Continue regular follow-up with primary care physician   - Maintain an active lifestyle within physical limitations    Follow-up   - Follow up with primary care physician as scheduled   - PRN follow-up with cardiology clinic if needed.               Signed, Donato Schultz, MD

## 2023-09-02 NOTE — Patient Instructions (Signed)
 Medication Instructions:  The current medical regimen is effective;  continue present plan and medications.  *If you need a refill on your cardiac medications before your next appointment, please call your pharmacy*  Follow-Up: At [facility], you and your health needs are our priority.  As part of our continuing mission to provide you with exceptional heart care, we have created designated Provider Care Teams.  These Care Teams include your primary Cardiologist (physician) and Advanced Practice Providers (APPs -  Physician Assistants and Nurse Practitioners) who all work together to provide you with the care you need, when you need it.  We recommend signing up for the patient portal called "MyChart".  Sign up information is provided on this After Visit Summary.  MyChart is used to connect with patients for Virtual Visits (Telemedicine).  Patients are able to view lab/test results, encounter notes, upcoming appointments, etc.  Non-urgent messages can be sent to your provider as well.   To learn more about what you can do with MyChart, go to [email or web address].    Your next appointment:   Follow up as needed.  [employer] blood pressure cuff    1st Floor: - Lobby - Registration  - Pharmacy  - Lab - Cafe  2nd Floor: - PV Lab - Diagnostic Testing (echo, CT, nuclear med)  3rd Floor: - Vacant  4th Floor: - TCTS (cardiothoracic surgery) - AFib Clinic - Structural Heart Clinic - Vascular Surgery  - Vascular Ultrasound  5th Floor: - HeartCare Cardiology (general and EP) - Clinical Pharmacy for coumadin, hypertension, lipid, weight-loss medications, and med management appointments    Valet parking services will be available as well.

## 2023-09-09 ENCOUNTER — Encounter: Payer: Self-pay | Admitting: Physical Therapy

## 2023-09-09 ENCOUNTER — Ambulatory Visit: Payer: Medicare Other | Attending: Family Medicine | Admitting: Physical Therapy

## 2023-09-09 DIAGNOSIS — G8929 Other chronic pain: Secondary | ICD-10-CM | POA: Insufficient documentation

## 2023-09-09 DIAGNOSIS — M6281 Muscle weakness (generalized): Secondary | ICD-10-CM | POA: Insufficient documentation

## 2023-09-09 DIAGNOSIS — R262 Difficulty in walking, not elsewhere classified: Secondary | ICD-10-CM | POA: Insufficient documentation

## 2023-09-09 DIAGNOSIS — R2681 Unsteadiness on feet: Secondary | ICD-10-CM | POA: Diagnosis not present

## 2023-09-09 DIAGNOSIS — M25562 Pain in left knee: Secondary | ICD-10-CM | POA: Insufficient documentation

## 2023-09-09 NOTE — Therapy (Signed)
 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 cad,  normal LVF,  ef 60%,  false positive myoview 01-26-2008  dr Anne Fu   GREEN LIGHT LASER TURP (TRANSURETHRAL RESECTION OF PROSTATE N/A 06/13/2015   Procedure: GREEN LIGHT LASER TURP (TRANSURETHRAL RESECTION OF PROSTATE;  Surgeon: Jerilee Field, MD;  Location: [facility];  Service: Urology;  Laterality: N/A;   HERNIA REPAIR  infant   KNEE ARTHROSCOPY W/ MENISCECTOMY Bilateral left  10-04-2008/  right 11-09-2007   and chondraplasty/  synovectomy   TRANSURETHRAL RESECTION OF PROSTATE N/A 05/18/2019   Procedure: TRANSURETHRAL RESECTION OF THE PROSTATE (TURP);  Surgeon: Jerilee Field, MD;  Location: [facility];  Service: Urology;  Laterality: N/A;   TRANSURETHRAL RESECTION OF PROSTATE N/A 03/19/2022   Procedure: CYSTOSCOPY/ TRANSURETHRAL RESECTION OF THE PROSTATE (TURP) RESIDUAL;  Surgeon: Jerilee Field, MD;  Location: [facility];  Service: Urology;  Laterality: N/A;   VASECTOMY  1980's   Patient Active Problem List   Diagnosis Date Noted   Mild cognitive impairment of uncertain or unknown etiology 08/11/2023   BPH (benign prostatic hyperplasia)    ED (erectile dysfunction)    Hyperlipidemia    Lower urinary tract symptoms (LUTS)    Vitamin D deficiency    Wears hearing aid    History of total knee replacement, bilateral    Pain in joint of left knee 05/22/2020   Osteoarthritis of left knee 01/24/2020   Hereditary hemochromatosis 04/10/2017    PCP: Gweneth Dimitri, MD  REFERRING PROVIDER: Corliss Blacker, MD  REFERRING DIAG: poor balance  Rationale for Evaluation and Treatment: Rehabilitation  THERAPY DIAG:  Muscle weakness (generalized)  Difficulty in walking, not elsewhere classified  Unsteadiness on feet  ONSET DATE: 05/08/23  SUBJECTIVE:  SUBJECTIVE STATEMENT: Reports that he is feeling a little better, no falls, reports that he would like to walk more on trails  PERTINENT HISTORY:  Bilateral TKA  PAIN:  Are you having pain? Yes: NPRS scale: 0/10 Pain location: left knee pain Pain description: tired, and ache Aggravating factors: walking without cane, steps Relieving factors: using cane helps, rest  PRECAUTIONS: Fall  RED FLAGS: None   WEIGHT BEARING RESTRICTIONS: No  FALLS:  Has patient fallen in last 6 months? No and reports stumbles and near falls  LIVING ENVIRONMENT: Lives with: lives with their family Lives in: House/apartment Stairs: Yes: Internal: 12 steps; can reach both Has following equipment at home: Single point cane  OCCUPATION: retired Psychologist, occupational  PLOF: Independent and has started using a cane for most walking  PATIENT GOALS: walking  faster, feeling more stable, possibly walk longer less use of the cane  NEXT MD VISIT: 6 months  OBJECTIVE:  Note: Objective measures were completed at Evaluation unless otherwise noted.  DIAGNOSTIC FINDINGS:  none COGNITION: Overall cognitive status: Within functional limits for tasks assessed     SENSATION: Lower legs are cool to the touch  MUSCLE LENGTH: Very tight HS, calves, ITB, piriformis  POSTURE: rounded shoulders, forward head, and decreased lumbar lordosis  PALPATION: LE's lower leg are cool to touch  LUMBAR ROM:   AROM eval  Flexion Decreased 25%  Extension Decreased 100%  Right lateral flexion Decreased 50%  Left lateral flexion Decreased 50%  Right rotation   Left rotation    (Blank rows = not tested)  LOWER EXTREMITY ROM:     Active  Right eval Left eval  Hip flexion    Hip extension    Hip abduction    Hip adduction    Hip internal rotation    Hip external rotation    Knee flexion    Knee extension 4 12  Ankle dorsiflexion    Ankle plantarflexion    Ankle inversion    Ankle eversion     (Blank rows = not tested)  LOWER EXTREMITY MMT:    MMT Right eval Left eval Right/left 07/16/23  Hip flexion 4 4   Hip extension 4 4   Hip abduction 4 4   Hip adduction     Hip internal rotation     Hip external rotation     Knee flexion 4 4   Knee extension 4- 4-   Ankle dorsiflexion 4- 3+ 4-/3+  Ankle plantarflexion   4-  Ankle inversion   4-  Ankle eversion   3+   (Blank rows = not tested)  FUNCTIONAL TESTS:  BERG 14/56, was 47/56 In September 2023 he scored 47/56   TUG 17 seconds, needs hands to get up, no device uncontrolled sit   3 minute walk test 500 feet no device, occasionally would use wall or  Furniture to balance GAIT: Distance walked: see above Assistive device utilized: Single point cane and None Level of assistance: SBA Comments: very unsteady, but if he is moving he does better, currently needs a cane, if he is standing  without walking he has to be in motion to find his balance and is always fighting his balance  TODAY'S TREATMENT:  DATE:   09/09/23 Gait outside around the back building no rest, good pace Resisted gait 40# all directions with CGA On airex 10# pulls and then chest press CGA for this Ball kicks Obstacle course Reaching for numbers on wall on and off airex Rocker board balance two ways butt and scapula wall touches Walking ball toss   09/01/23 Tmill pushes 3 x 20 s Minitramp bounce and march 20s x 3 each [employer], volleyball On airex number reaching On rocker board 2 ways , balance and reaching On upside down bosu balance  Resisted gait 40# all directions Walking ball toss Tug 12 seconds Side stepping over stick 12" toe touches  07/30/23 Discussed the correspondence with the MD office 40# resisted gait all directions On slant board two ways reaching for #'s on wall Side stepping over sticks Walking over sticks trying to have longer stride Back to wall butt and shoulder blade touches Facing wall head and tried pelvic touches Sit-stand-throw-catch-sit with 6.6# ball Sit to stand with reaching overhead with 6.6# ball Standing ball toss looking up  07/23/23 Bike level 5 x 6 minutes Back to wall butt and scapular touches In pbars touching numbers on wall and then doing this on the airex Walking ball toss Standing on slant board two ways working on balance Cone toe touch Standing on slant board two ways reaching for numbers on wall Direction changes with walking Tandem walk  07/15/22 Nustep level 6 x 6 minutes LE only In pbars step turn touch On airex touching numbers on wall with some cognitive math  Side step on and off airex [employer], volleyball Ball kicks Side stepping over sticks Then walking without change of pace over sticks Tested ankle DF  and eversion in box above, very weak He had a lot of questions about his condition noted below  07/07/23 Nustep level 5 x 4 minutes Gait outside with walking stick up and down hill Butt touches to wall and then scapula touches On airex ball toss Step turn touch  Side step on and off airex Walking ball toss 40# resisted gait all directions Side step over stick 8" toe touches On airex facing wall reaching Off airex reaching   PATIENT EDUCATION:  Education details: POC/HEP Person educated: Patient Education method: Explanation Education comprehension: verbalized understanding  HOME EXERCISE PROGRAM: Access Code: [email or web address] URL: [email or web address] Date: 06/10/2023 Prepared by: Stacie Glaze  Exercises - Standing Marching  - 1 x daily - 7 x weekly - 2 sets - 10 reps - 3 hold - Standing Hip Abduction  - 1 x daily - 7 x weekly - 2 sets - 10 reps - 3 hold - Standing Hip Extension with Chair  - 1 x daily - 7 x weekly - 2 sets - 10 reps - 3 hold  ASSESSMENT:  CLINICAL IMPRESSION: Patient is a 78 y.o. male who was seen today for physical therapy evaluation and treatment for balance issues, and unsteady gait.  He scored a 14/56 on the BERG and last year in September this score was 47/56. He did better today with walking around the back building, did not have SOB coming up the hill. Reports that he would like to walk some at the park, we talked about safety and the need for a hiking pole, he did not use anything today with our walk.He improved his TUG time by 5 seconds,  OBJECTIVE IMPAIRMENTS: Abnormal gait, cardiopulmonary status limiting activity, decreased activity tolerance, decreased balance, decreased coordination, decreased endurance, decreased mobility, difficulty  walking, decreased ROM, decreased strength, increased fascial restrictions, impaired perceived functional ability, increased muscle spasms, impaired flexibility, improper body mechanics, postural  dysfunction, and pain.   REHAB POTENTIAL: Good  CLINICAL DECISION MAKING: Stable/uncomplicated  EVALUATION COMPLEXITY: Low   GOALS: Goals reviewed with patient? Yes  SHORT TERM GOALS: Target date: 07/10/23  Independent with initial HEP Goal status: met 07/07/23  LONG TERM GOALS: Target date: 09/07/23  Independent with advanced HEP Goal status: progressing 09/01/23  2.  Increase BERG balance test score to 40/56 Goal status: progressing 09/01/23  3.  Increase 3 minute walk distance to 700 feet Goal status: met 09/01/23  4.  Decrease TUG to 15 seconds Goal status: met 09/01/23  5.  Decrease 5XSTS to 18 seconds Goal status: met 09/09/23 16 seconds  PLAN:  PT FREQUENCY: 1-2x/week  PT DURATION: 12 weeks  PLANNED INTERVENTIONS: 97164- PT Re-evaluation, 97110-Therapeutic exercises, 97530- Therapeutic activity, 97112- Neuromuscular re-education, 97535- Self Care, 16109- Manual therapy, 97116- Gait training, 97014- Electrical stimulation (unattended), Patient/Family education, Balance training, Stair training, Dry Needling, Cryotherapy, and Moist heat.  PLAN FOR NEXT SESSION: continue to push his balance   Jearld Lesch, PT 09/09/2023, 2:54 PM

## 2023-09-16 ENCOUNTER — Encounter: Payer: Self-pay | Admitting: Physical Therapy

## 2023-09-16 ENCOUNTER — Ambulatory Visit: Payer: Medicare Other | Admitting: Physical Therapy

## 2023-09-16 DIAGNOSIS — R2681 Unsteadiness on feet: Secondary | ICD-10-CM | POA: Diagnosis not present

## 2023-09-16 DIAGNOSIS — M25562 Pain in left knee: Secondary | ICD-10-CM | POA: Diagnosis not present

## 2023-09-16 DIAGNOSIS — R262 Difficulty in walking, not elsewhere classified: Secondary | ICD-10-CM | POA: Diagnosis not present

## 2023-09-16 DIAGNOSIS — G8929 Other chronic pain: Secondary | ICD-10-CM | POA: Diagnosis not present

## 2023-09-16 DIAGNOSIS — M6281 Muscle weakness (generalized): Secondary | ICD-10-CM | POA: Diagnosis not present

## 2023-09-16 NOTE — Therapy (Signed)
 OUTPATIENT PHYSICAL THERAPY THORACOLUMBAR TREATMENT   Patient Name: Nicholas Mahoney MRN: [id number] DOB:1946/07/06, 78 y.o., male Today's Date: 09/16/2023  END OF SESSION:  PT End of Session - 09/16/23 0856     Visit Number 8    Date for PT Re-Evaluation 09/29/23    Authorization Type Medicare    PT Start Time 0844    PT Stop Time 0929    PT Time Calculation (min) 45 min    Activity Tolerance Patient tolerated treatment well    Behavior During Therapy [facility] for tasks assessed/performed             Past Medical History:  Diagnosis Date   BPH (benign prostatic hyperplasia)    ED (erectile dysfunction)    Hereditary hemochromatosis 04/10/2017   History of COVID-19 12/2020   asymptomatic   History of total knee replacement, bilateral    Hyperlipidemia    Lower urinary tract symptoms (LUTS)    Mild cognitive impairment of uncertain or unknown etiology 08/11/2023   Osteoarthritis of left knee 01/24/2020   Pain in joint of left knee 05/22/2020   Vitamin D deficiency    Wears glasses    Wears hearing aid    bilateral   Past Surgical History:  Procedure Laterality Date   APPENDECTOMY  1980's   CARDIAC CATHETERIZATION     abnormal myoview/  no sig. cad,  normal LVF,  ef 60%,  false positive myoview 01-26-2008  dr Anne Fu   GREEN LIGHT LASER TURP (TRANSURETHRAL RESECTION OF PROSTATE N/A 06/13/2015   Procedure: GREEN LIGHT LASER TURP (TRANSURETHRAL RESECTION OF PROSTATE;  Surgeon: Jerilee Field, MD;  Location: [facility];  Service: Urology;  Laterality: N/A;   HERNIA REPAIR  infant   KNEE ARTHROSCOPY W/ MENISCECTOMY Bilateral left  10-04-2008/  right 11-09-2007   and chondraplasty/  synovectomy   TRANSURETHRAL RESECTION OF PROSTATE N/A 05/18/2019   Procedure: TRANSURETHRAL RESECTION OF THE PROSTATE (TURP);  Surgeon: Jerilee Field, MD;  Location: [facility];  Service: Urology;  Laterality: N/A;   TRANSURETHRAL RESECTION OF PROSTATE N/A  03/19/2022   Procedure: CYSTOSCOPY/ TRANSURETHRAL RESECTION OF THE PROSTATE (TURP) RESIDUAL;  Surgeon: Jerilee Field, MD;  Location: [facility];  Service: Urology;  Laterality: N/A;   VASECTOMY  1980's   Patient Active Problem List   Diagnosis Date Noted   Mild cognitive impairment of uncertain or unknown etiology 08/11/2023   BPH (benign prostatic hyperplasia)    ED (erectile dysfunction)    Hyperlipidemia    Lower urinary tract symptoms (LUTS)    Vitamin D deficiency    Wears hearing aid    History of total knee replacement, bilateral    Pain in joint of left knee 05/22/2020   Osteoarthritis of left knee 01/24/2020   Hereditary hemochromatosis 04/10/2017    PCP: Gweneth Dimitri, MD  REFERRING PROVIDER: Corliss Blacker, MD  REFERRING DIAG: poor balance  Rationale for Evaluation and Treatment: Rehabilitation  THERAPY DIAG:  Muscle weakness (generalized)  Difficulty in walking, not elsewhere classified  Unsteadiness on feet  ONSET DATE: 05/08/23  SUBJECTIVE:  SUBJECTIVE STATEMENT: Patient comes in and wants to talk regarding his balance and some new pains in his feet  PERTINENT HISTORY:  Bilateral TKA  PAIN:  Are you having pain? Yes: NPRS scale: 0/10 Pain location: left knee pain Pain description: tired, and ache Aggravating factors: walking without cane, steps Relieving factors: using cane helps, rest  PRECAUTIONS: Fall  RED FLAGS: None   WEIGHT BEARING RESTRICTIONS: No  FALLS:  Has patient fallen in last 6 months? No and reports stumbles and near falls  LIVING ENVIRONMENT: Lives with: lives with their family Lives in: House/apartment Stairs: Yes: Internal: 12 steps; can reach both Has following equipment at home: Single point cane  OCCUPATION: retired  Psychologist, occupational  PLOF: Independent and has started using a cane for most walking  PATIENT GOALS: walking faster, feeling more stable, possibly walk longer less use of the cane  NEXT MD VISIT: 6 months  OBJECTIVE:  Note: Objective measures were completed at Evaluation unless otherwise noted.  DIAGNOSTIC FINDINGS:  none COGNITION: Overall cognitive status: Within functional limits for tasks assessed     SENSATION: Lower legs are cool to the touch  MUSCLE LENGTH: Very tight HS, calves, ITB, piriformis  POSTURE: rounded shoulders, forward head, and decreased lumbar lordosis  PALPATION: LE's lower leg are cool to touch  LUMBAR ROM:   AROM eval  Flexion Decreased 25%  Extension Decreased 100%  Right lateral flexion Decreased 50%  Left lateral flexion Decreased 50%  Right rotation   Left rotation    (Blank rows = not tested)  LOWER EXTREMITY ROM:     Active  Right eval Left eval  Hip flexion    Hip extension    Hip abduction    Hip adduction    Hip internal rotation    Hip external rotation    Knee flexion    Knee extension 4 12  Ankle dorsiflexion    Ankle plantarflexion    Ankle inversion    Ankle eversion     (Blank rows = not tested)  LOWER EXTREMITY MMT:    MMT Right eval Left eval Right/left 07/16/23  Hip flexion 4 4   Hip extension 4 4   Hip abduction 4 4   Hip adduction     Hip internal rotation     Hip external rotation     Knee flexion 4 4   Knee extension 4- 4-   Ankle dorsiflexion 4- 3+ 4-/3+  Ankle plantarflexion   4-  Ankle inversion   4-  Ankle eversion   3+   (Blank rows = not tested)  FUNCTIONAL TESTS:  BERG 14/56, was 47/56 In September 2023 he scored 47/56   TUG 17 seconds, needs hands to get up, no device uncontrolled sit   3 minute walk test 500 feet no device, occasionally would use wall or  Furniture to balance GAIT: Distance walked: see above Assistive device utilized: Single point cane and None Level of assistance:  SBA Comments: very unsteady, but if he is moving he does better, currently needs a cane, if he is standing without walking he has to be in motion to find his balance and is always fighting his balance  TODAY'S TREATMENT:  DATE:   09/16/23 Nustep level 6 x 6 minutes LE only Tmill push fwd and backward 20 s x 3 each On airex ball toss Added to HEP as noted below and performed all  with both legs Reviewed other HEP for fall and balance reactions  Spoke with him regarding the feet and possible neuropathy, the test for this being NCVT, the progressiveness of the neuropathy and the possible medications that may help with the new pains he is having Gait around the back building no device and then in the grass and uneven terrain   09/09/23 Gait outside around the back building no rest, good pace Resisted gait 40# all directions with CGA On airex 10# pulls and then chest press CGA for this Ball kicks Obstacle course Reaching for numbers on wall on and off airex Rocker board balance two ways butt and scapula wall touches Walking ball toss   09/01/23 Tmill pushes 3 x 20 s Minitramp bounce and march 20s x 3 each [employer], volleyball On airex number reaching On rocker board 2 ways , balance and reaching On upside down bosu balance  Resisted gait 40# all directions Walking ball toss Tug 12 seconds Side stepping over stick 12" toe touches  07/30/23 Discussed the correspondence with the MD office 40# resisted gait all directions On slant board two ways reaching for #'s on wall Side stepping over sticks Walking over sticks trying to have longer stride Back to wall butt and shoulder blade touches Facing wall head and tried pelvic touches Sit-stand-throw-catch-sit with 6.6# ball Sit to stand with reaching overhead with 6.6# ball Standing ball toss looking  up  07/23/23 Bike level 5 x 6 minutes Back to wall butt and scapular touches In pbars touching numbers on wall and then doing this on the airex Walking ball toss Standing on slant board two ways working on balance Cone toe touch Standing on slant board two ways reaching for numbers on wall Direction changes with walking Tandem walk  07/15/22 Nustep level 6 x 6 minutes LE only In pbars step turn touch On airex touching numbers on wall with some cognitive math  Side step on and off airex [employer], volleyball [employer] Side stepping over sticks Then walking without change of pace over sticks Tested ankle DF and eversion in box above, very weak He had a lot of questions about his condition noted below  07/07/23 Nustep level 5 x 4 minutes Gait outside with walking stick up and down hill Butt touches to wall and then scapula touches On airex ball toss Step turn touch  Side step on and off airex Walking ball toss 40# resisted gait all directions Side step over stick 8" toe touches On airex facing wall reaching Off airex reaching   PATIENT EDUCATION:  Education details: POC/HEP Person educated: Patient Education method: Explanation Education comprehension: verbalized understanding  HOME EXERCISE PROGRAM: Access Code: [email or web address] URL: [email or web address] Date: 06/10/2023 Prepared by: Stacie Glaze  Exercises - Standing Marching  - 1 x daily - 7 x weekly - 2 sets - 10 reps - 3 hold - Standing Hip Abduction  - 1 x daily - 7 x weekly - 2 sets - 10 reps - 3 hold - Standing Hip Extension with Chair  - 1 x daily - 7 x weekly - 2 sets - 10 reps - 3 hold  Access Code: [email or web address] URL: [email or web address] Date: 09/16/2023 Prepared by: Stacie Glaze  Exercises - Ankle Dorsiflexion with Resistance  - 1 x daily -  7 x weekly - 3 sets - 10 reps - 3 hold - Ankle Eversion with Resistance  - 1 x daily - 7 x weekly - 3 sets - 10 reps - 3 hold -  Ankle Inversion with Resistance  - 1 x daily - 7 x weekly - 3 sets - 10 reps - 3 hold - Ankle and Toe Plantarflexion with Resistance  - 1 x daily - 7 x weekly - 3 sets - 10 reps - 3 hold - Seated Ankle Eversion with Resistance  - 1 x daily - 7 x weekly - 3 sets - 10 reps - 3 hold - Seated Ankle Inversion with Resistance and Legs Crossed  - 1 x daily - 7 x weekly - 3 sets - 10 reps - 3 hold  ASSESSMENT:  CLINICAL IMPRESSION: Patient had questions regarding the balance and his feet, reports some new pains in the feet, we had discussed neuropathy in the past as he is a little weaker with DF and inversion and eversion.  We discussed seeing MD and the way that they test for this, discussed PT and staying active and moving, gave new HEP to focus on the ankles  Patient is a 78 y.o. male who was seen today for physical therapy evaluation and treatment for balance issues, and unsteady gait.  He scored a 14/56 on the BERG and last year in September this score was 47/56.   OBJECTIVE IMPAIRMENTS: Abnormal gait, cardiopulmonary status limiting activity, decreased activity tolerance, decreased balance, decreased coordination, decreased endurance, decreased mobility, difficulty walking, decreased ROM, decreased strength, increased fascial restrictions, impaired perceived functional ability, increased muscle spasms, impaired flexibility, improper body mechanics, postural dysfunction, and pain.   REHAB POTENTIAL: Good  CLINICAL DECISION MAKING: Stable/uncomplicated  EVALUATION COMPLEXITY: Low   GOALS: Goals reviewed with patient? Yes  SHORT TERM GOALS: Target date: 07/10/23  Independent with initial HEP Goal status: met 07/07/23  LONG TERM GOALS: Target date: 09/07/23  Independent with advanced HEP Goal status: progressing 09/16/23  2.  Increase BERG balance test score to 40/56 Goal status: progressing 09/16/23  3.  Increase 3 minute walk distance to 700 feet Goal status: met 09/01/23  4.  Decrease  TUG to 15 seconds Goal status: met 09/01/23  5.  Decrease 5XSTS to 18 seconds Goal status: met 09/09/23 16 seconds  PLAN:  PT FREQUENCY: 1-2x/week  PT DURATION: 12 weeks  PLANNED INTERVENTIONS: 97164- PT Re-evaluation, 97110-Therapeutic exercises, 97530- Therapeutic activity, 97112- Neuromuscular re-education, 97535- Self Care, 16109- Manual therapy, 97116- Gait training, 97014- Electrical stimulation (unattended), Patient/Family education, Balance training, Stair training, Dry Needling, Cryotherapy, and Moist heat.  PLAN FOR NEXT SESSION: continue to push his balance   Jearld Lesch, PT 09/16/2023, 8:57 AM

## 2023-09-22 DIAGNOSIS — G3184 Mild cognitive impairment, so stated: Secondary | ICD-10-CM | POA: Diagnosis not present

## 2023-09-22 DIAGNOSIS — E854 Organ-limited amyloidosis: Secondary | ICD-10-CM | POA: Diagnosis not present

## 2023-09-22 DIAGNOSIS — R2689 Other abnormalities of gait and mobility: Secondary | ICD-10-CM | POA: Diagnosis not present

## 2023-09-22 DIAGNOSIS — H9190 Unspecified hearing loss, unspecified ear: Secondary | ICD-10-CM | POA: Diagnosis not present

## 2023-09-22 DIAGNOSIS — E785 Hyperlipidemia, unspecified: Secondary | ICD-10-CM | POA: Diagnosis not present

## 2023-09-22 DIAGNOSIS — N401 Enlarged prostate with lower urinary tract symptoms: Secondary | ICD-10-CM | POA: Diagnosis not present

## 2023-09-29 ENCOUNTER — Ambulatory Visit: Admitting: Physical Therapy

## 2023-10-06 ENCOUNTER — Encounter: Payer: Self-pay | Admitting: Physical Therapy

## 2023-10-06 ENCOUNTER — Ambulatory Visit: Admitting: Physical Therapy

## 2023-10-06 DIAGNOSIS — G8929 Other chronic pain: Secondary | ICD-10-CM

## 2023-10-06 DIAGNOSIS — M25562 Pain in left knee: Secondary | ICD-10-CM | POA: Diagnosis not present

## 2023-10-06 DIAGNOSIS — M6281 Muscle weakness (generalized): Secondary | ICD-10-CM

## 2023-10-06 DIAGNOSIS — R2681 Unsteadiness on feet: Secondary | ICD-10-CM | POA: Diagnosis not present

## 2023-10-06 DIAGNOSIS — R262 Difficulty in walking, not elsewhere classified: Secondary | ICD-10-CM

## 2023-10-06 NOTE — Therapy (Signed)
 OUTPATIENT PHYSICAL THERAPY THORACOLUMBAR TREATMENT   Patient Name: Nicholas Mahoney MRN: [id number] DOB:01/23/1946, 78 y.o., male Today's Date: 10/06/2023  END OF SESSION:  PT End of Session - 10/06/23 1446     Visit Number 9    Date for PT Re-Evaluation 11/06/23    Authorization Type Medicare    PT Start Time 1445    PT Stop Time 1530    PT Time Calculation (min) 45 min    Activity Tolerance Patient tolerated treatment well    Behavior During Therapy [facility] for tasks assessed/performed             Past Medical History:  Diagnosis Date   BPH (benign prostatic hyperplasia)    ED (erectile dysfunction)    Hereditary hemochromatosis 04/10/2017   History of COVID-19 12/2020   asymptomatic   History of total knee replacement, bilateral    Hyperlipidemia    Lower urinary tract symptoms (LUTS)    Mild cognitive impairment of uncertain or unknown etiology 08/11/2023   Osteoarthritis of left knee 01/24/2020   Pain in joint of left knee 05/22/2020   Vitamin D deficiency    Wears glasses    Wears hearing aid    bilateral   Past Surgical History:  Procedure Laterality Date   APPENDECTOMY  1980's   CARDIAC CATHETERIZATION     abnormal myoview/  no sig. cad,  normal LVF,  ef 60%,  false positive myoview 01-26-2008  dr Anne Fu   GREEN LIGHT LASER TURP (TRANSURETHRAL RESECTION OF PROSTATE N/A 06/13/2015   Procedure: GREEN LIGHT LASER TURP (TRANSURETHRAL RESECTION OF PROSTATE;  Surgeon: Jerilee Field, MD;  Location: [facility];  Service: Urology;  Laterality: N/A;   HERNIA REPAIR  infant   KNEE ARTHROSCOPY W/ MENISCECTOMY Bilateral left  10-04-2008/  right 11-09-2007   and chondraplasty/  synovectomy   TRANSURETHRAL RESECTION OF PROSTATE N/A 05/18/2019   Procedure: TRANSURETHRAL RESECTION OF THE PROSTATE (TURP);  Surgeon: Jerilee Field, MD;  Location: [facility];  Service: Urology;  Laterality: N/A;   TRANSURETHRAL RESECTION OF PROSTATE N/A  03/19/2022   Procedure: CYSTOSCOPY/ TRANSURETHRAL RESECTION OF THE PROSTATE (TURP) RESIDUAL;  Surgeon: Jerilee Field, MD;  [facility];  Service: Urology;  Laterality: N/A;   VASECTOMY  1980's   Patient Active Problem List   Diagnosis Date Noted   Mild cognitive impairment of uncertain or unknown etiology 08/11/2023   BPH (benign prostatic hyperplasia)    ED (erectile dysfunction)    Hyperlipidemia    Lower urinary tract symptoms (LUTS)    Vitamin D deficiency    Wears hearing aid    History of total knee replacement, bilateral    Pain in joint of left knee 05/22/2020   Osteoarthritis of left knee 01/24/2020   Hereditary hemochromatosis 04/10/2017    PCP: Gweneth Dimitri, MD  REFERRING PROVIDER: Corliss Blacker, MD  REFERRING DIAG: poor balance  Rationale for Evaluation and Treatment: Rehabilitation  THERAPY DIAG:  Muscle weakness (generalized)  Difficulty in walking, not elsewhere classified  Unsteadiness on feet  Chronic pain of left knee  ONSET DATE: 05/08/23  SUBJECTIVE:  SUBJECTIVE STATEMENT: Patient reports no falls  PERTINENT HISTORY:  Bilateral TKA  PAIN:  Are you having pain? Yes: NPRS scale: 0/10 Pain location: left knee pain Pain description: tired, and ache Aggravating factors: walking without cane, steps Relieving factors: using cane helps, rest  PRECAUTIONS: Fall  RED FLAGS: None   WEIGHT BEARING RESTRICTIONS: No  FALLS:  Has patient fallen in last 6 months? No and reports stumbles and near falls  LIVING ENVIRONMENT: Lives with: lives with their family Lives in: House/apartment Stairs: Yes: Internal: 12 steps; can reach both Has following equipment at home: Single point cane  OCCUPATION: retired Psychologist, occupational  PLOF: Independent and has  started using a cane for most walking  PATIENT GOALS: walking faster, feeling more stable, possibly walk longer less use of the cane  NEXT MD VISIT: 6 months  OBJECTIVE:  Note: Objective measures were completed at Evaluation unless otherwise noted.  DIAGNOSTIC FINDINGS:  none COGNITION: Overall cognitive status: Within functional limits for tasks assessed     SENSATION: Lower legs are cool to the touch  MUSCLE LENGTH: Very tight HS, calves, ITB, piriformis  POSTURE: rounded shoulders, forward head, and decreased lumbar lordosis  PALPATION: LE's lower leg are cool to touch  LUMBAR ROM:   AROM eval  Flexion Decreased 25%  Extension Decreased 100%  Right lateral flexion Decreased 50%  Left lateral flexion Decreased 50%  Right rotation   Left rotation    (Blank rows = not tested)  LOWER EXTREMITY ROM:     Active  Right eval Left eval  Hip flexion    Hip extension    Hip abduction    Hip adduction    Hip internal rotation    Hip external rotation    Knee flexion    Knee extension 4 12  Ankle dorsiflexion    Ankle plantarflexion    Ankle inversion    Ankle eversion     (Blank rows = not tested)  LOWER EXTREMITY MMT:    MMT Right eval Left eval Right/left 07/16/23  Hip flexion 4 4   Hip extension 4 4   Hip abduction 4 4   Hip adduction     Hip internal rotation     Hip external rotation     Knee flexion 4 4   Knee extension 4- 4-   Ankle dorsiflexion 4- 3+ 4-/3+  Ankle plantarflexion   4-  Ankle inversion   4-  Ankle eversion   3+   (Blank rows = not tested)  FUNCTIONAL TESTS:  BERG 14/56, was 47/56 In September 2023 he scored 47/56   TUG 17 seconds, needs hands to get up, no device uncontrolled sit   3 minute walk test 500 feet no device, occasionally would use wall or  Furniture to balance GAIT: Distance walked: see above Assistive device utilized: Single point cane and None Level of assistance: SBA Comments: very unsteady, but if he is  moving he does better, currently needs a cane, if he is standing without walking he has to be in motion to find his balance and is always fighting his balance  TODAY'S TREATMENT:  DATE:   10/06/23 Bike level 5 x 5 minutes Butt touches Scapular touches On rocker board 2 ways Sit to stand and reach with weighted ball Reviewed HEP and the gym, safety and walking, answered his questions   09/16/23 Nustep level 6 x 6 minutes LE only Tmill push fwd and backward 20 s x 3 each On airex ball toss Added to HEP as noted below and performed all  with both legs Reviewed other HEP for fall and balance reactions  Spoke with him regarding the feet and possible neuropathy, the test for this being NCVT, the progressiveness of the neuropathy and the possible medications that may help with the new pains he is having Gait around the back building no device and then in the grass and uneven terrain   09/09/23 Gait outside around the back building no rest, good pace Resisted gait 40# all directions with CGA On airex 10# pulls and then chest press CGA for this Ball kicks Obstacle course Reaching for numbers on wall on and off airex Rocker board balance two ways butt and scapula wall touches Walking ball toss   09/01/23 Tmill pushes 3 x 20 s Minitramp bounce and march 20s x 3 each [employer], volleyball On airex number reaching On rocker board 2 ways , balance and reaching On upside down bosu balance  Resisted gait 40# all directions Walking ball toss Tug 12 seconds Side stepping over stick 12" toe touches  07/30/23 Discussed the correspondence with the MD office 40# resisted gait all directions On slant board two ways reaching for #'s on wall Side stepping over sticks Walking over sticks trying to have longer stride Back to wall butt and shoulder blade touches Facing wall  head and tried pelvic touches Sit-stand-throw-catch-sit with 6.6# ball Sit to stand with reaching overhead with 6.6# ball Standing ball toss looking up  07/23/23 Bike level 5 x 6 minutes Back to wall butt and scapular touches In pbars touching numbers on wall and then doing this on the airex Walking ball toss Standing on slant board two ways working on balance Cone toe touch Standing on slant board two ways reaching for numbers on wall Direction changes with walking Tandem walk  07/15/22 Nustep level 6 x 6 minutes LE only In pbars step turn touch On airex touching numbers on wall with some cognitive math  Side step on and off airex [employer], volleyball Ball kicks Side stepping over sticks Then walking without change of pace over sticks Tested ankle DF and eversion in box above, very weak He had a lot of questions about his condition noted below PATIENT EDUCATION:  Education details: POC/HEP Person educated: Patient Education method: Explanation Education comprehension: verbalized understanding  HOME EXERCISE PROGRAM: Access Code: [email or web address] URL: [email or web address] Date: 06/10/2023 Prepared by: Stacie Glaze  Exercises - Standing Marching  - 1 x daily - 7 x weekly - 2 sets - 10 reps - 3 hold - Standing Hip Abduction  - 1 x daily - 7 x weekly - 2 sets - 10 reps - 3 hold - Standing Hip Extension with Chair  - 1 x daily - 7 x weekly - 2 sets - 10 reps - 3 hold  Access Code: [email or web address] URL: [email or web address] Date: 09/16/2023 Prepared by: Stacie Glaze  Exercises - Ankle Dorsiflexion with Resistance  - 1 x daily - 7 x weekly - 3 sets - 10 reps - 3 hold - Ankle Eversion with Resistance  - 1 x daily - 7 x weekly -  3 sets - 10 reps - 3 hold - Ankle Inversion with Resistance  - 1 x daily - 7 x weekly - 3 sets - 10 reps - 3 hold - Ankle and Toe Plantarflexion with Resistance  - 1 x daily - 7 x weekly - 3 sets - 10 reps - 3 hold - Seated  Ankle Eversion with Resistance  - 1 x daily - 7 x weekly - 3 sets - 10 reps - 3 hold - Seated Ankle Inversion with Resistance and Legs Crossed  - 1 x daily - 7 x weekly - 3 sets - 10 reps - 3 hold  ASSESSMENT:  CLINICAL IMPRESSION: Patient and I talked today regarding his progress and where he is at with his balance, he is going to the gym and working with a trainer on strength and balance, he also reports that he is walking more and feels that he will be able to continue on his own  Patient is a 78 y.o. male who was seen today for physical therapy evaluation and treatment for balance issues, and unsteady gait.  He scored a 14/56 on the BERG and last year in September this score was 47/56.   OBJECTIVE IMPAIRMENTS: Abnormal gait, cardiopulmonary status limiting activity, decreased activity tolerance, decreased balance, decreased coordination, decreased endurance, decreased mobility, difficulty walking, decreased ROM, decreased strength, increased fascial restrictions, impaired perceived functional ability, increased muscle spasms, impaired flexibility, improper body mechanics, postural dysfunction, and pain.   REHAB POTENTIAL: Good  CLINICAL DECISION MAKING: Stable/uncomplicated  EVALUATION COMPLEXITY: Low   GOALS: Goals reviewed with patient? Yes  SHORT TERM GOALS: Target date: 07/10/23  Independent with initial HEP Goal status: met 07/07/23  LONG TERM GOALS: Target date: 09/07/23  Independent with advanced HEP Goal status: met 09/26/23  2.  Increase BERG balance test score to 40/56 Goal status: progressing 09/16/23 not met 10/06/23  3.  Increase 3 minute walk distance to 700 feet Goal status: met 09/01/23  4.  Decrease TUG to 15 seconds Goal status: met 09/01/23  5.  Decrease 5XSTS to 18 seconds Goal status: met 09/09/23 16 seconds  PLAN:  PT FREQUENCY: 1-2x/week  PT DURATION: 12 weeks  PLANNED INTERVENTIONS: 97164- PT Re-evaluation, 97110-Therapeutic exercises, 97530-  Therapeutic activity, 97112- Neuromuscular re-education, 97535- Self Care, 16109- Manual therapy, 97116- Gait training, 97014- Electrical stimulation (unattended), Patient/Family education, Balance training, Stair training, Dry Needling, Cryotherapy, and Moist heat.  PLAN FOR NEXT SESSION: most goals met, he has a good program in place, discharge   Jearld Lesch, PT 10/06/2023, 2:55 PM

## 2023-10-16 DIAGNOSIS — N401 Enlarged prostate with lower urinary tract symptoms: Secondary | ICD-10-CM | POA: Diagnosis not present

## 2023-10-16 DIAGNOSIS — R3912 Poor urinary stream: Secondary | ICD-10-CM | POA: Diagnosis not present

## 2023-10-16 DIAGNOSIS — N281 Cyst of kidney, acquired: Secondary | ICD-10-CM | POA: Diagnosis not present

## 2023-10-30 ENCOUNTER — Encounter: Payer: Self-pay | Admitting: Physician Assistant

## 2023-11-06 DIAGNOSIS — L910 Hypertrophic scar: Secondary | ICD-10-CM | POA: Diagnosis not present

## 2023-11-10 ENCOUNTER — Other Ambulatory Visit

## 2023-11-10 ENCOUNTER — Institutional Professional Consult (permissible substitution): Payer: Medicare Other | Admitting: Psychology

## 2023-11-10 ENCOUNTER — Ambulatory Visit: Payer: Self-pay

## 2023-11-10 ENCOUNTER — Encounter: Payer: Self-pay | Admitting: Physician Assistant

## 2023-11-10 ENCOUNTER — Ambulatory Visit: Admitting: Physician Assistant

## 2023-11-10 VITALS — BP 129/74 | HR 64 | Resp 20 | Wt 172.0 lb

## 2023-11-10 DIAGNOSIS — G3184 Mild cognitive impairment, so stated: Secondary | ICD-10-CM | POA: Diagnosis not present

## 2023-11-10 DIAGNOSIS — R413 Other amnesia: Secondary | ICD-10-CM

## 2023-11-10 NOTE — Progress Notes (Signed)
 Assessment/Plan:   Mild cognitive impairment of unclear etiology  Nicholas Mahoney is a delightful 78 y.o. RH male with a history of hypertension, hyperlipidemia, hereditary hemochromatosis, iron deficiency anemia. BPH, history of BPPV, Bilateral sensorineural hearing loss, arthritis  presenting today in follow-up for evaluation of memory loss. Patient is on donepezil  10 mg daily. Etiology of memory loss remains unclear, although the patient is highly intelligent and was able to perform well during neuropsych testing. There is some concern for neurodegenerative disease, although other components, including hearing, depression, and vascular disease may be playing a role. Patient is able to participate on ADLs and to drive short distances. Discussed biomarker testing to rule out AD. May consider other imaging, such as metabolic PET for FTD as well. We discussed psychotherapy, does not appear as interested.      Recommendations:   Follow up in 6  months. Continue donepezil  10 mg daily, side effects discussed Plasma biomarkers to further evaluate for neurodegenerative disorder such as Alzheimer's disease Repeat neuropsych evaluation in 18 to 24 months for diagnostic clarity Continue 12 supplements daily Recommend good control of cardiovascular risk factors Continue to check hearing in an effort to improve comprehension Agree with psychotherapy for anxiety, marital discord, another psychiatric distress issues Continue to control mood as per PCP    Subjective:   This patient is accompanied in the office by his wife  who supplements the history. Previous records as well as any outside records available were reviewed prior to todays visit.   Patient was last seen on 06/03/2023 last MoCA on 04/15/2023 was 27/30    Any changes in memory since last visit? "His executive function is not that good"-wife says.  Continues to have some difficulty remembering new information, instructions, recent  conversations with names, perhaps with hearing loss contributing to some of the symptoms.  LTM is normal.  He likes to read, walk the dog.  If the conversation is too fast, he may have difficulty processing information. He cannot comprehend the TV anymore according to his wife. He checked his hearing and will repeat testing for possible cochlear implant (but worried about the recovery time).   repeats oneself?  Endorsed, especially with appointments. Disoriented when walking into a room?  Patient denies     Misplacing objects?  Patient may misplace some objects such as the keys alone but not in unusual places.   Wandering behavior?   denies   Any personality changes since last visit?  He has increased frustration as before, especially with some task that used to be easier, "hearing is frustrating as well". "He has difficulty with instructions"-wife says.  There may be a component of situational depression as mentioned in prior visit. Hallucinations or paranoia?  denies   Seizures?   denies    Any sleep changes? Sleeps well, on mirtazapine. Denies vivid dreams, REM behavior or sleepwalking   Sleep apnea?   denies    Any hygiene concerns?   denies   Independent of bathing and dressing?  Endorsed  Does the patient needs help with medications?  Wife is in charge   Who is in charge of the finances?  Wife is in charge     Any changes in appetite?  denies     Patient have trouble swallowing?  denies   Does the patient cook?  Yes, but only the evening meals, he may be forgetting some common recipes and how to multitask in the kitchen.   Any kitchen accidents such as leaving  the stove on?   denies   Any headaches?    denies   Vision changes? denies Chronic pain?  denies   Ambulates with difficulty?   He uses a cane for stability, now at all times.  Decreased balance, interested in PT.     Recent falls or head injuries?    denies      Unilateral weakness, numbness or tingling? denies   Any tremors?   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 Any incontinence of urine?  Denies. Any bowel dysfunction?  denies      Patient lives with his wife  Does the patient drive?  Yes, he denies getting lost, he drives is short distance only, reaction time is decreased   prefers to have a copilot for instructions.   MRI of the brain from 05/08/23. 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 Additionally, there are foci of chronic hemosiderin deposition scattered along the bilateral cerebral hemispheres consistent with sequelae of remote subarachnoid hemorrhage, chronic microhemorrhages also noted within the cerebellar vermis and right cerebellar hemisphere, background mild chronic small vessel ischemic changes within the cerebral white matter and mild generalized cerebral atrophy.   How long did patient have memory difficulties?  For about 2-3 years "maybe longer"-wife says. Patient reports some difficulty remembering new information, instructions recent conversations, names. "He was always extremely analytical and organized, not so much now"-wife says. 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 "Normal conversation is too fast and he cannot process information" Likes to read, walk the dog.  LTM is good.  repeats oneself?  Endorsed  He has difficulty dealing with time, appts, asking time over and over again. Disoriented when walking into a room? Patient denies    Leaving objects in unusual places? "Some, such as keys and his phone" not in unusual places   Wandering behavior? Denies.   Any personality changes, or depression, anxiety? Endorsed. "He has frustration over many things, difficulty dealing with tasks that used to be easy". He has an obsession with traffic and weather,  handwashing frequently". He has difficulty with instructions. His wife thinks there is some component of depression .  Hallucinations or paranoia? denies   Seizures? denies    Any sleep changes?  Sleeps well . Denies vivid dreams, REM behavior or sleepwalking   Sleep apnea? Denies. Wife says he snores a lot.   Any hygiene concerns?  Denies.   Independent of bathing and dressing? Endorsed  Does the patient need help with medications? Patient is in charge  Who is in charge of the finances? Wife is in charge    Any changes in appetite?   Denies.     Patient have trouble swallowing?  Denies.   Does the patient cook? Not much, only the evening meals, but it is becoming more difficult and forgetting common recipes and to multitask  Any headaches?  Denies.   Chronic pain? Denies.   Ambulates with difficulty? Denies.   Needs a cane when he is on uneven ground for stability.   Recent falls or head injuries? Denies. One year ago in the house, but was mechanical (was trying to put his pants on), no head injury. He has to hold on to the stuff.     Vision changes?  Denies any new issues.  Any strokelike symptoms?  Denies.   Any tremors? Denies.   Any anosmia? Endorsed for several years, he thinks that it may have happened after Covid while in [place].   Any incontinence of urine? Denies Had prostate surgeries in the past, not currently    Any bowel dysfunction? Denies.      Patient lives with wife    History of heavy alcohol intake? Denies.   History of heavy tobacco use? Denies.   Family history of dementia?   A younger brother with possible dementia.  Does patient drive?Nicholas Mahoney, denies getting lost . 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  Retired Magazine features editor.    Past Medical History:  Diagnosis Date   BPH (benign prostatic hyperplasia)    ED (erectile dysfunction)    Hereditary hemochromatosis 04/10/2017    History of COVID-19 12/2020   asymptomatic   History of total knee replacement, bilateral    Hyperlipidemia    Lower urinary tract symptoms (LUTS)    Mild cognitive impairment of uncertain or unknown etiology 08/11/2023   Osteoarthritis of left knee 01/24/2020   Pain in joint of left knee 05/22/2020   Vitamin D  deficiency    Wears glasses    Wears hearing aid    bilateral     Past Surgical History:  Procedure Laterality Date   APPENDECTOMY  1980's   CARDIAC CATHETERIZATION     abnormal myoview/  no sig. cad,  normal LVF,  ef 60%,  false positive myoview 01-26-2008  dr Renna Cary   GREEN LIGHT LASER TURP (TRANSURETHRAL RESECTION OF PROSTATE N/A 06/13/2015   Procedure: GREEN LIGHT LASER TURP (TRANSURETHRAL RESECTION OF PROSTATE;  Surgeon: Christina Coyer, MD;  Location: [facility];  Service: Urology;  Laterality: N/A;   HERNIA REPAIR  infant   KNEE ARTHROSCOPY W/ MENISCECTOMY Bilateral left  10-04-2008/  right 11-09-2007   and chondraplasty/  synovectomy   TRANSURETHRAL RESECTION OF PROSTATE N/A 05/18/2019   Procedure: TRANSURETHRAL RESECTION OF THE PROSTATE (TURP);  Surgeon: Christina Coyer, MD;  Location: [facility];  Service: Urology;  Laterality: N/A;   TRANSURETHRAL RESECTION OF PROSTATE N/A 03/19/2022   Procedure: CYSTOSCOPY/ TRANSURETHRAL RESECTION OF THE PROSTATE (TURP) RESIDUAL;  Surgeon: Christina Coyer, MD;  Location: [facility];  Service: Urology;  Laterality: N/A;   VASECTOMY  1980's     PREVIOUS MEDICATIONS:   CURRENT MEDICATIONS:  Outpatient Encounter Medications as of 11/10/2023  Medication Sig   aspirin  81 MG tablet Take 1 tablet (81 mg total) by mouth daily.   Cholecalciferol (VITAMIN D3 PO) Take 1,000 Units by mouth daily.   cyanocobalamin (VITAMIN B12) 1000 MCG tablet 1 tablet Orally Once a day   donepezil  (ARICEPT ) 10 MG tablet Take half tablet (5 mg) daily for 2 weeks, then increase to the full tablet at 10 mg  daily   hyoscyamine (LEVSIN SL) 0.125 MG SL tablet    rosuvastatin  (CRESTOR ) 10 MG tablet TAKE 1 TABLET BY MOUTH EVERY DAY   Cyanocobalamin 250 MCG LOZG Take 250 mcg by mouth daily. (Patient not taking: Reported on 11/10/2023)   finasteride (PROSCAR) 5 MG tablet Take 5 mg by mouth daily. (Patient not taking: Reported on 11/10/2023)   mirtazapine (REMERON) 7.5 MG tablet 1 tablet before bedtime for one week then increase to 2 tabs Orally Once a day for 30 days (Patient not taking: Reported on 11/10/2023)   No facility-administered encounter medications on file  as of 11/10/2023.     Objective:     PHYSICAL EXAMINATION:    VITALS:   Vitals:   11/10/23 1254  BP: 129/74  Pulse: 64  Resp: 20  SpO2: 100%  Weight: 172 lb (78 kg)    GEN:  The patient appears stated age and is in NAD. HEENT:  Normocephalic, atraumatic.   Neurological examination:  General: NAD, well-groomed, appears stated age. Orientation: The patient is alert. Oriented to person, place and date Cranial nerves: There is good facial symmetry. Flat affect. The speech is fluent and clear. No aphasia or dysarthria. Fund of knowledge is appropriate. Recent memory impaired and remote memory is normal.  Attention and concentration are reduced. Able to name objects and repeat phrases.  Hearing is very reduced  to conversational tone .     Sensation: Sensation is intact to light touch throughout Motor: Strength is at least antigravity x4. DTR's 2/4 in UE/LE      04/15/2023   12:00 PM 05/08/2020   11:00 AM  Montreal Cognitive Assessment   Visuospatial/ Executive (0/5) 5 4  Naming (0/3) 3 3  Attention: Read list of digits (0/2) 2 2  Attention: Read list of letters (0/1) 1 1  Attention: Serial 7 subtraction starting at 100 (0/3) 3 3  Language: Repeat phrase (0/2) 2 2  Language : Fluency (0/1) 1 1  Abstraction (0/2) 2 2  Delayed Recall (0/5) 2 1  Orientation (0/6) 6 6  Total 27 25  Adjusted Score (based on education) 27 25         No data to display             Movement examination: Tone: There is normal tone in the UE/LE Abnormal movements: very mild B intention tremor.  No myoclonus.  No asterixis.   Coordination:  There is no decremation with RAM's. Normal finger to nose  Gait and Station: The patient has no difficulty arising out of a deep-seated chair without the use of the hands. The patient's stride length is short,  somewhat unsteady.  Gait is cautious and narrow.   Thank you for allowing us  the opportunity to participate in the care of this nice patient. Please do not hesitate to contact us  for any questions or concerns.   Total time spent on today's visit was 40 minutes dedicated to this patient today, preparing to see patient, examining the patient, ordering tests and/or medications and counseling the patient, documenting clinical information in the EHR or other health record, independently interpreting results and communicating results to the patient/family, discussing treatment and goals, answering patient's questions and coordinating care.  Cc:  Helyn Lobstein, MD  Tex Filbert 11/10/2023 6:07 PM

## 2023-11-10 NOTE — Patient Instructions (Addendum)
 It was a pleasure to see you today at our office.   Recommendations:  Continue donepezil   10 mg daily  Follow up  November 20 at 11:30  Plasma biomarkers [address] Check hearing     For psychiatric meds, mood meds: Please have your primary care physician manage these medications.  If you have any severe symptoms of a stroke, or other severe issues such as confusion,severe chills or fever, etc call 911 or go to the ER as you may need to be evaluated further     For assessment of decision of mental capacity and competency:  Call Dr. Laverne Potter, geriatric psychiatrist at [phone]  Counseling regarding caregiver distress, including caregiver depression, anxiety and issues regarding community resources, adult day care programs, adult living facilities, or memory care questions:  please contact your  Primary Doctor's Social Worker   Whom to call: Memory  decline, memory medications: Call our office [phone]    [email or web address]   RECOMMENDATIONS FOR ALL PATIENTS WITH MEMORY PROBLEMS: 1. Continue to exercise (Recommend 30 minutes of walking everyday, or 3 hours every week) 2. Increase social interactions - continue going to [place] and enjoy social gatherings with friends and family 3. Eat healthy, avoid fried foods and eat more fruits and vegetables 4. Maintain adequate blood pressure, blood sugar, and blood cholesterol level. Reducing the risk of stroke and cardiovascular disease also helps promoting better memory. 5. Avoid stressful situations. Live a simple life and avoid aggravations. Organize your time and prepare for the next day in anticipation. 6. Sleep well, avoid any interruptions of sleep and avoid any distractions in the bedroom that may interfere with adequate sleep quality 7. Avoid sugar, avoid sweets as there is a strong link between excessive sugar intake, diabetes, and cognitive impairment We discussed  the Mediterranean diet, which has been shown to help patients reduce the risk of progressive memory disorders and reduces cardiovascular risk. This includes eating fish, eat fruits and green leafy vegetables, nuts like almonds and hazelnuts, walnuts, and also use olive oil. Avoid fast foods and fried foods as much as possible. Avoid sweets and sugar as sugar use has been linked to worsening of memory function.  There is always a concern of gradual progression of memory problems. If this is the case, then we may need to adjust level of care according to patient needs. Support, both to the patient and caregiver, should then be put into place.         DRIVING: Regarding driving, in patients with progressive memory problems, driving will be impaired. We advise to have someone else do the driving if trouble finding directions or if minor accidents are reported. Independent driving assessment is available to determine safety of driving.   If you are interested in the driving assessment, you can contact the following:  [employer] in [place] [phone]  [facility] [phone]  [facility] [phone]  [facility] [phone] or [phone]   FALL PRECAUTIONS: Be cautious when walking. Scan the area for obstacles that may increase the risk of trips and falls. When getting up in the mornings, sit up at the edge of the bed for a few minutes before getting out of bed. Consider elevating the bed at the head end to avoid drop of blood pressure when getting up. Walk always in a well-lit room (use night lights in the walls). Avoid area rugs or power cords from appliances in the middle of the walkways. Use a walker or a  cane if necessary and consider physical therapy for balance exercise. Get your eyesight checked regularly.  FINANCIAL OVERSIGHT: Supervision, especially oversight when making financial decisions or transactions is also  recommended.  HOME SAFETY: Consider the safety of the kitchen when operating appliances like stoves, microwave oven, and blender. Consider having supervision and share cooking responsibilities until no longer able to participate in those. Accidents with firearms and other hazards in the house should be identified and addressed as well.   ABILITY TO BE LEFT ALONE: If patient is unable to contact 911 operator, consider using LifeLine, or when the need is there, arrange for someone to stay with patients. Smoking is a fire hazard, consider supervision or cessation. Risk of wandering should be assessed by caregiver and if detected at any point, supervision and safe proof recommendations should be instituted.  MEDICATION SUPERVISION: Inability to self-administer medication needs to be constantly addressed. Implement a mechanism to ensure safe administration of the medications.      Mediterranean Diet A Mediterranean diet refers to food and lifestyle choices that are based on the traditions of countries located on the [employer]. This way of eating has been shown to help prevent certain conditions and improve outcomes for people who have chronic diseases, like kidney disease and heart disease. What are tips for following this plan? Lifestyle  Cook and eat meals together with your family, when possible. Drink enough fluid to keep your urine clear or pale yellow. Be physically active every day. This includes: Aerobic exercise like running or swimming. Leisure activities like gardening, walking, or housework. Get 7-8 hours of sleep each night. If recommended by your health care provider, drink red wine in moderation. This means 1 glass a day for nonpregnant women and 2 glasses a day for men. A glass of wine equals 5 oz (150 mL). Reading food labels  Check the serving size of packaged foods. For foods such as rice and pasta, the serving size refers to the amount of cooked product, not  dry. Check the total fat in packaged foods. Avoid foods that have saturated fat or trans fats. Check the ingredients list for added sugars, such as corn syrup. Shopping  At the grocery store, buy most of your food from the areas near the walls of the store. This includes: Fresh fruits and vegetables (produce). Grains, beans, nuts, and seeds. Some of these may be available in unpackaged forms or large amounts (in bulk). Fresh seafood. Poultry and eggs. Low-fat dairy products. Buy whole ingredients instead of prepackaged foods. Buy fresh fruits and vegetables in-season from local farmers markets. Buy frozen fruits and vegetables in resealable bags. If you do not have access to quality fresh seafood, buy precooked frozen shrimp or canned fish, such as tuna, salmon, or sardines. Buy small amounts of raw or cooked vegetables, salads, or olives from the deli or salad bar at your store. Stock your pantry so you always have certain foods on hand, such as olive oil, canned tuna, canned tomatoes, rice, pasta, and beans. Cooking  Cook foods with extra-virgin olive oil instead of using butter or other vegetable oils. Have meat as a side dish, and have vegetables or grains as your main dish. This means having meat in small portions or adding small amounts of meat to foods like pasta or stew. Use beans or vegetables instead of meat in common dishes like chili or lasagna. Experiment with different cooking methods. Try roasting or broiling vegetables instead of steaming or sauteing them. Add frozen vegetables  to soups, stews, pasta, or rice. Add nuts or seeds for added healthy fat at each meal. You can add these to yogurt, salads, or vegetable dishes. Marinate fish or vegetables using olive oil, lemon juice, garlic, and fresh herbs. Meal planning  Plan to eat 1 vegetarian meal one day each week. Try to work up to 2 vegetarian meals, if possible. Eat seafood 2 or more times a week. Have healthy snacks  readily available, such as: Vegetable sticks with hummus. Greek yogurt. Fruit and nut trail mix. Eat balanced meals throughout the week. This includes: Fruit: 2-3 servings a day Vegetables: 4-5 servings a day Low-fat dairy: 2 servings a day Fish, poultry, or lean meat: 1 serving a day Beans and legumes: 2 or more servings a week Nuts and seeds: 1-2 servings a day Whole grains: 6-8 servings a day Extra-virgin olive oil: 3-4 servings a day Limit red meat and sweets to only a few servings a month What are my food choices? Mediterranean diet Recommended Grains: Whole-grain pasta. Brown rice. Bulgar wheat. Polenta. Couscous. Whole-wheat bread. Dwyane Glad. Vegetables: Artichokes. Beets. Broccoli. Cabbage. Carrots. Eggplant. Green beans. Chard. Kale. Spinach. Onions. Leeks. Peas. Squash. Tomatoes. Peppers. Radishes. Fruits: Apples. Apricots. Avocado. Berries. Bananas. Cherries. Dates. Figs. Grapes. Lemons. Melon. Oranges. Peaches. Plums. Pomegranate. Meats and other protein foods: Beans. Almonds. Sunflower seeds. Pine nuts. Peanuts. Cod. Salmon. Scallops. Shrimp. Tuna. Tilapia. Clams. Oysters. Eggs. Dairy: Low-fat milk. Cheese. Greek yogurt. Beverages: Water . Red wine. Herbal tea. Fats and oils: Extra virgin olive oil. Avocado oil. Grape seed oil. Sweets and desserts: [place] yogurt with honey. Baked apples. Poached pears. Trail mix. Seasoning and other foods: Basil. Cilantro. Coriander. Cumin. Mint. Parsley. Sage. Rosemary. Tarragon. Garlic. Oregano. Thyme. Pepper. Balsalmic vinegar. Tahini. Hummus. Tomato sauce. Olives. Mushrooms. Limit these Grains: Prepackaged pasta or rice dishes. Prepackaged cereal with added sugar. Vegetables: Deep fried potatoes (french fries). Fruits: Fruit canned in syrup. Meats and other protein foods: Beef. Pork. Lamb. Poultry with skin. Hot dogs. Helene Loader. Dairy: Ice cream. Sour cream. Whole milk. Beverages: Juice. Sugar-sweetened soft drinks. Beer. Liquor and  spirits. Fats and oils: Butter. Canola oil. Vegetable oil. Beef fat (tallow). Lard. Sweets and desserts: Cookies. Cakes. Pies. Candy. Seasoning and other foods: Mayonnaise. Premade sauces and marinades. The items listed may not be a complete list. Talk with your dietitian about what dietary choices are right for you. Summary The Mediterranean diet includes both food and lifestyle choices. Eat a variety of fresh fruits and vegetables, beans, nuts, seeds, and whole grains. Limit the amount of red meat and sweets that you eat. Talk with your health care provider about whether it is safe for you to drink red wine in moderation. This means 1 glass a day for nonpregnant women and 2 glasses a day for men. A glass of wine equals 5 oz (150 mL). This information is not intended to replace advice given to you by your health care provider. Make sure you discuss any questions you have with your health care provider. Document Released: 02/15/2016 Document Revised: 03/19/2016 Document Reviewed: 02/15/2016 Elsevier Interactive Patient Education  2017 [employer].

## 2023-11-14 LAB — QUEST AD-DETECT™, BETA-AMYLOID 42/40 RATIO, PLASMA
ABETA 40: 313 pg/mL
ABETA 42/40 RATIO: 0.137 — ABNORMAL LOW (ref >=0.170)
ABETA 42: 43 pg/mL

## 2023-11-14 LAB — QUEST AD-DETECT PHOSPHORYLATED TAU217(P-TAU217), PLASMA: Quest Detect PTAU217, Plasma: 0.42 pg/mL — ABNORMAL HIGH (ref <=0.15)

## 2023-11-14 LAB — QUEST AD-DETECT® PHOSPHORYLATED TAU181(P-TAU181), PLASMA: QUEST AD DETECT PTAU181, PLASMA: 2.48 pg/mL — ABNORMAL HIGH (ref <=1.07)

## 2023-11-18 ENCOUNTER — Encounter: Payer: Medicare Other | Admitting: Psychology

## 2023-12-02 ENCOUNTER — Ambulatory Visit: Payer: Self-pay | Admitting: Oncology

## 2023-12-02 ENCOUNTER — Inpatient Hospital Stay: Payer: Medicare Other | Attending: Oncology

## 2023-12-02 DIAGNOSIS — H903 Sensorineural hearing loss, bilateral: Secondary | ICD-10-CM | POA: Diagnosis not present

## 2023-12-02 LAB — FERRITIN: Ferritin: 76 ng/mL (ref 24–336)

## 2023-12-03 ENCOUNTER — Encounter: Payer: Self-pay | Admitting: Oncology

## 2023-12-03 NOTE — Telephone Encounter (Signed)
-----   Message from Coni Deep sent at 12/02/2023  6:02 PM EDT ----- Please call patient, the ferritin level is normal and remains in goal range, follow-up as scheduled

## 2023-12-03 NOTE — Telephone Encounter (Signed)
 LVM for patient/wife to return call to discuss labs results.

## 2023-12-03 NOTE — Telephone Encounter (Signed)
 Nicholas Mahoney returned call and notified of ferritin in goal range and to f/u on 05/18/24.

## 2023-12-03 NOTE — Progress Notes (Signed)
 LVM for return call.

## 2023-12-05 ENCOUNTER — Ambulatory Visit: Admitting: Physician Assistant

## 2023-12-08 DIAGNOSIS — L910 Hypertrophic scar: Secondary | ICD-10-CM | POA: Diagnosis not present

## 2023-12-23 ENCOUNTER — Ambulatory Visit: Payer: Medicare Other | Admitting: Physician Assistant

## 2023-12-25 DIAGNOSIS — K4091 Unilateral inguinal hernia, without obstruction or gangrene, recurrent: Secondary | ICD-10-CM | POA: Diagnosis not present

## 2024-01-27 DIAGNOSIS — H903 Sensorineural hearing loss, bilateral: Secondary | ICD-10-CM | POA: Diagnosis not present

## 2024-01-28 DIAGNOSIS — K4091 Unilateral inguinal hernia, without obstruction or gangrene, recurrent: Secondary | ICD-10-CM | POA: Diagnosis not present

## 2024-02-02 DIAGNOSIS — R531 Weakness: Secondary | ICD-10-CM | POA: Diagnosis not present

## 2024-02-02 DIAGNOSIS — R2689 Other abnormalities of gait and mobility: Secondary | ICD-10-CM | POA: Diagnosis not present

## 2024-02-02 DIAGNOSIS — M25561 Pain in right knee: Secondary | ICD-10-CM | POA: Diagnosis not present

## 2024-02-02 DIAGNOSIS — M6281 Muscle weakness (generalized): Secondary | ICD-10-CM | POA: Diagnosis not present

## 2024-02-09 DIAGNOSIS — R2689 Other abnormalities of gait and mobility: Secondary | ICD-10-CM | POA: Diagnosis not present

## 2024-02-09 DIAGNOSIS — M25561 Pain in right knee: Secondary | ICD-10-CM | POA: Diagnosis not present

## 2024-02-09 DIAGNOSIS — M6281 Muscle weakness (generalized): Secondary | ICD-10-CM | POA: Diagnosis not present

## 2024-02-09 DIAGNOSIS — R531 Weakness: Secondary | ICD-10-CM | POA: Diagnosis not present

## 2024-02-11 DIAGNOSIS — M6281 Muscle weakness (generalized): Secondary | ICD-10-CM | POA: Diagnosis not present

## 2024-02-13 DIAGNOSIS — M6281 Muscle weakness (generalized): Secondary | ICD-10-CM | POA: Diagnosis not present

## 2024-02-16 DIAGNOSIS — M6281 Muscle weakness (generalized): Secondary | ICD-10-CM | POA: Diagnosis not present

## 2024-02-18 DIAGNOSIS — M6281 Muscle weakness (generalized): Secondary | ICD-10-CM | POA: Diagnosis not present

## 2024-02-20 DIAGNOSIS — M6281 Muscle weakness (generalized): Secondary | ICD-10-CM | POA: Diagnosis not present

## 2024-02-23 DIAGNOSIS — M6281 Muscle weakness (generalized): Secondary | ICD-10-CM | POA: Diagnosis not present

## 2024-02-25 DIAGNOSIS — M6281 Muscle weakness (generalized): Secondary | ICD-10-CM | POA: Diagnosis not present

## 2024-02-27 DIAGNOSIS — M6281 Muscle weakness (generalized): Secondary | ICD-10-CM | POA: Diagnosis not present

## 2024-03-02 DIAGNOSIS — M6281 Muscle weakness (generalized): Secondary | ICD-10-CM | POA: Diagnosis not present

## 2024-03-04 DIAGNOSIS — M6281 Muscle weakness (generalized): Secondary | ICD-10-CM | POA: Diagnosis not present

## 2024-03-09 DIAGNOSIS — M6281 Muscle weakness (generalized): Secondary | ICD-10-CM | POA: Diagnosis not present

## 2024-03-10 ENCOUNTER — Other Ambulatory Visit: Payer: Self-pay | Admitting: Physician Assistant

## 2024-03-11 DIAGNOSIS — M6281 Muscle weakness (generalized): Secondary | ICD-10-CM | POA: Diagnosis not present

## 2024-03-16 DIAGNOSIS — M6281 Muscle weakness (generalized): Secondary | ICD-10-CM | POA: Diagnosis not present

## 2024-03-16 DIAGNOSIS — H903 Sensorineural hearing loss, bilateral: Secondary | ICD-10-CM | POA: Diagnosis not present

## 2024-03-17 DIAGNOSIS — Z23 Encounter for immunization: Secondary | ICD-10-CM | POA: Diagnosis not present

## 2024-03-18 DIAGNOSIS — M6281 Muscle weakness (generalized): Secondary | ICD-10-CM | POA: Diagnosis not present

## 2024-03-23 DIAGNOSIS — M6281 Muscle weakness (generalized): Secondary | ICD-10-CM | POA: Diagnosis not present

## 2024-03-25 DIAGNOSIS — M6281 Muscle weakness (generalized): Secondary | ICD-10-CM | POA: Diagnosis not present

## 2024-03-30 DIAGNOSIS — M6281 Muscle weakness (generalized): Secondary | ICD-10-CM | POA: Diagnosis not present

## 2024-03-31 DIAGNOSIS — E785 Hyperlipidemia, unspecified: Secondary | ICD-10-CM | POA: Diagnosis not present

## 2024-03-31 DIAGNOSIS — N401 Enlarged prostate with lower urinary tract symptoms: Secondary | ICD-10-CM | POA: Diagnosis not present

## 2024-03-31 DIAGNOSIS — G479 Sleep disorder, unspecified: Secondary | ICD-10-CM | POA: Diagnosis not present

## 2024-03-31 DIAGNOSIS — Z860100 Personal history of colon polyps, unspecified: Secondary | ICD-10-CM | POA: Diagnosis not present

## 2024-03-31 DIAGNOSIS — R2689 Other abnormalities of gait and mobility: Secondary | ICD-10-CM | POA: Diagnosis not present

## 2024-03-31 DIAGNOSIS — G3184 Mild cognitive impairment, so stated: Secondary | ICD-10-CM | POA: Diagnosis not present

## 2024-03-31 DIAGNOSIS — I700 Atherosclerosis of aorta: Secondary | ICD-10-CM | POA: Diagnosis not present

## 2024-03-31 DIAGNOSIS — Z6823 Body mass index (BMI) 23.0-23.9, adult: Secondary | ICD-10-CM | POA: Diagnosis not present

## 2024-04-20 DIAGNOSIS — M6281 Muscle weakness (generalized): Secondary | ICD-10-CM | POA: Diagnosis not present

## 2024-04-22 DIAGNOSIS — Z1331 Encounter for screening for depression: Secondary | ICD-10-CM | POA: Diagnosis not present

## 2024-04-22 DIAGNOSIS — M6281 Muscle weakness (generalized): Secondary | ICD-10-CM | POA: Diagnosis not present

## 2024-04-22 DIAGNOSIS — Z0000 Encounter for general adult medical examination without abnormal findings: Secondary | ICD-10-CM | POA: Diagnosis not present

## 2024-04-27 DIAGNOSIS — M6281 Muscle weakness (generalized): Secondary | ICD-10-CM | POA: Diagnosis not present

## 2024-04-28 DIAGNOSIS — M6281 Muscle weakness (generalized): Secondary | ICD-10-CM | POA: Diagnosis not present

## 2024-05-03 DIAGNOSIS — N401 Enlarged prostate with lower urinary tract symptoms: Secondary | ICD-10-CM | POA: Diagnosis not present

## 2024-05-04 DIAGNOSIS — M6281 Muscle weakness (generalized): Secondary | ICD-10-CM | POA: Diagnosis not present

## 2024-05-06 ENCOUNTER — Telehealth: Payer: Self-pay | Admitting: Oncology

## 2024-05-06 DIAGNOSIS — M6281 Muscle weakness (generalized): Secondary | ICD-10-CM | POA: Diagnosis not present

## 2024-05-06 NOTE — Telephone Encounter (Signed)
 Left detailed message about PT rescheduled appt time and date.

## 2024-05-11 DIAGNOSIS — M6281 Muscle weakness (generalized): Secondary | ICD-10-CM | POA: Diagnosis not present

## 2024-05-13 DIAGNOSIS — M6281 Muscle weakness (generalized): Secondary | ICD-10-CM | POA: Diagnosis not present

## 2024-05-18 ENCOUNTER — Ambulatory Visit: Payer: Medicare Other | Admitting: Oncology

## 2024-05-18 ENCOUNTER — Other Ambulatory Visit: Payer: Medicare Other

## 2024-05-18 DIAGNOSIS — M6281 Muscle weakness (generalized): Secondary | ICD-10-CM | POA: Diagnosis not present

## 2024-05-20 DIAGNOSIS — M6281 Muscle weakness (generalized): Secondary | ICD-10-CM | POA: Diagnosis not present

## 2024-05-24 ENCOUNTER — Inpatient Hospital Stay: Attending: Oncology

## 2024-05-24 ENCOUNTER — Inpatient Hospital Stay: Admitting: Oncology

## 2024-05-24 ENCOUNTER — Encounter: Payer: Self-pay | Admitting: Oncology

## 2024-05-24 DIAGNOSIS — N400 Enlarged prostate without lower urinary tract symptoms: Secondary | ICD-10-CM | POA: Diagnosis not present

## 2024-05-24 DIAGNOSIS — M1710 Unilateral primary osteoarthritis, unspecified knee: Secondary | ICD-10-CM | POA: Diagnosis not present

## 2024-05-24 DIAGNOSIS — H9193 Unspecified hearing loss, bilateral: Secondary | ICD-10-CM | POA: Diagnosis not present

## 2024-05-24 DIAGNOSIS — F0390 Unspecified dementia without behavioral disturbance: Secondary | ICD-10-CM | POA: Insufficient documentation

## 2024-05-24 LAB — CBC WITH DIFFERENTIAL (CANCER CENTER ONLY)
Abs Immature Granulocytes: 0.03 K/uL (ref 0.00–0.07)
Basophils Absolute: 0.1 K/uL (ref 0.0–0.1)
Basophils Relative: 1 %
Eosinophils Absolute: 0 K/uL (ref 0.0–0.5)
Eosinophils Relative: 0 %
HCT: 43.1 % (ref 39.0–52.0)
Hemoglobin: 14.8 g/dL (ref 13.0–17.0)
Immature Granulocytes: 0 %
Lymphocytes Relative: 41 %
Lymphs Abs: 3.9 K/uL (ref 0.7–4.0)
MCH: 32.3 pg (ref 26.0–34.0)
MCHC: 34.3 g/dL (ref 30.0–36.0)
MCV: 94.1 fL (ref 80.0–100.0)
Monocytes Absolute: 0.5 K/uL (ref 0.1–1.0)
Monocytes Relative: 6 %
Neutro Abs: 4.9 K/uL (ref 1.7–7.7)
Neutrophils Relative %: 52 %
Platelet Count: 187 K/uL (ref 150–400)
RBC: 4.58 MIL/uL (ref 4.22–5.81)
RDW: 11.9 % (ref 11.5–15.5)
WBC Count: 9.4 K/uL (ref 4.0–10.5)
nRBC: 0 % (ref 0.0–0.2)

## 2024-05-24 LAB — FERRITIN: Ferritin: 91 ng/mL (ref 24–336)

## 2024-05-24 NOTE — Progress Notes (Signed)
   [facility] OFF[facility] PROGRESS NOTE   Diagnosis: Hemochromatosis  INTERVAL HISTORY:     Nicholas Mahoney returns as scheduled.  He reports feeling well.  He fell approximately 5 days ago while having bushes and developed an ecchymosis at his right forehead.  He has balance difficulty due to knee arthritis.  He is now being followed by neurology for memory loss and has been diagnosed with dementia.  Objective:  Vital signs in last 24 hours:  Blood pressure 137/65, pulse 72, temperature 98.7 F (37.1 C), temperature source Temporal, resp. rate 18, height 6' 1 (1.854 m), weight 175 lb 14.4 oz (79.8 kg), SpO2 98%.   Resp: Lungs clear bilaterally Cardio: Regular rate and rhythm GI: No hepatosplenomegaly, nontender, no mass, mild venous engorgement at the upper abdomen Vascular: Delores discoloration of the lower leg bilaterally, no edema  Skin: Healing abrasion and resolving ecchymosis at the right forehead  Portacath/PICC-without erythema  Lab Results:  Lab Results  Component Value Date   WBC 9.4 05/24/2024   HGB 14.8 05/24/2024   HCT 43.1 05/24/2024   MCV 94.1 05/24/2024   PLT 187 05/24/2024   NEUTROABS 4.9 05/24/2024    CMP  Lab Results  Component Value Date   NA 140 10/19/2018   K 4.5 10/19/2018   CL 106 10/19/2018   CO2 24 10/19/2018   GLUCOSE 84 10/19/2018   BUN 21 10/19/2018   CREATININE 0.79 10/19/2018   CALCIUM  9.2 10/19/2018   PROT 6.9 09/07/2019   ALBUMIN 4.6 09/07/2019   AST 57 (H) 09/07/2019   ALT 32 01/19/2021   ALKPHOS 80 09/07/2019   BILITOT 0.7 09/07/2019   GFRNONAA >60 10/19/2018   GFRAA >60 10/19/2018     Medications: I have reviewed the patient's current medications.   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   BPH, status post TUR x2   3.   History of positional vertigo   4.   Bilateral hearing loss 5.   Knee arthritis-status post bilateral knee replacement 6.   Low ferritin 05/21/2022, negative stool Hemoccults 7.   Dementia     Disposition: Nicholas Mahoney has hereditary hemochromatosis.  He has not undergone phlebotomy for years.  Ferritin level remained in goal range when he was here in May.  We will follow-up on the ferritin level from today.  He will return for a lab visit in 6 months and an office visit in 12 months.  Arley Hof, MD  05/24/2024  3:01 PM

## 2024-05-25 ENCOUNTER — Telehealth: Payer: Self-pay | Admitting: *Deleted

## 2024-05-25 DIAGNOSIS — M6281 Muscle weakness (generalized): Secondary | ICD-10-CM | POA: Diagnosis not present

## 2024-05-25 NOTE — Telephone Encounter (Signed)
 Notified Nicholas Mahoney that ferritin level is in goal range, follow-up as scheduled

## 2024-05-26 NOTE — Progress Notes (Signed)
 Assessment/Plan:    Mild cognitive impairment of unclear etiology  Nicholas Mahoney is a delightful 78 y.o. RH male with a history ofhypertension, hyperlipidemia, hereditary hemochromatosis, iron deficiency anemia. BPH, history of BPPV, Bilateral sensorineural hearing loss, arthritis  presenting today in follow-up for evaluation of memory loss. Patient is on donepezil  10 mg daily***.  Etiology of memory loss remains unclear, likely multifactorial given other components including hearing, depression and vascular disease possibly playing a role.  He is able to participate in his ADLs and to drive short distances.  We also discussed psychotherapy but he is not interested at time.     Recommendations:   Follow up in   months. Continue donepezil  10 mg daily, side effects discussed Repeat neuropsych evaluation in 12 months for diagnostic clarity Continue to check hearing in an effort to improve comprehension Recommend psychotherapy for anxiety, marital discord and other psychiatric distress issues Recommend good control of cardiovascular risk factors Continue to control mood as per PCP    Subjective:   This patient is accompanied in the office by his wife***  who supplements the history. Previous records as well as any outside records available were reviewed prior to todays visit.   Patient was last seen on 11/10/2023***.    Any changes in memory since last visit? .  Continues to have difficulty remembering new information, instructions, recent conversations and names, some of these issues may be attributed to decreased hearing.  The conversation is too fast he may had difficulty processing information. LTM is normal.  He likes to read, walk the dog.  Wife reports that he cannot apprehend the TV anymore***. repeats oneself?  Endorsed, especially with appointments Disoriented when walking into a room?  Patient denies ***  Misplacing objects?  Patient denies   Wandering behavior?    Denies. Any personality changes since last visit?  As before, he has increased frustration especially with tasks that used to be easier.  He has difficulty following instructions per wife's report.  There may be a component of situational depression. Hallucinations or paranoia?  Denies.   Seizures?   Denies.    Any sleep changes? Sleeps well with mirtazapine. Does not sleep very well***.   Denies vivid dreams, REM behavior or sleepwalking   Sleep apnea?   denies ***  Any hygiene concerns?   Denies.   Independent of bathing and dressing?  Endorsed  Does the patient needs help with medications?  Wife is in charge *** Who is in charge of the finances?  Wife is in charge   *** Any changes in appetite?  denies ***   Patient have trouble swallowing?  Denies.   Does the patient cook?  Yes, only the evening meals, he may be forgetting some common recipes or how to multitask in the kitchen. Any kitchen accidents such as leaving the stove on?   Denies.   Any headaches?    Denies.   Vision changes? Denies. Chronic pain?  Denies.   Ambulates with difficulty?    Denies.  Uses a cane for stability due to decreased balance due to arthritis of the knee.***  Recent falls or head injuries?    Denies.      Unilateral weakness, numbness or tingling?  Denies.   Any tremors?  Denies.   Any anosmia?    Endorsed, for many years.   Any incontinence of urine?  Denies.   Any bowel dysfunction?  Denies.      Patient lives with his wife.*** Does  the patient drive?  Yes, he denies hearing loss, he drives only short distances, wife reports that he has decreased reaction time.  He prefers to have a copilot for instructions.***  Past Medical History:  Diagnosis Date   BPH (benign prostatic hyperplasia)    ED (erectile dysfunction)    Hereditary hemochromatosis 04/10/2017   History of COVID-19 12/2020   asymptomatic   History of total knee replacement, bilateral    Hyperlipidemia    Lower urinary tract symptoms  (LUTS)    Mild cognitive impairment of uncertain or unknown etiology 08/11/2023   Osteoarthritis of left knee 01/24/2020   Pain in joint of left knee 05/22/2020   Vitamin D  deficiency    Wears glasses    Wears hearing aid    bilateral     Past Surgical History:  Procedure Laterality Date   APPENDECTOMY  1980's   CARDIAC CATHETERIZATION     abnormal myoview/  no sig. cad,  normal LVF,  ef 60%,  false positive myoview 01-26-2008  dr jeffrie   GREEN LIGHT LASER TURP (TRANSURETHRAL RESECTION OF PROSTATE N/A 06/13/2015   Procedure: GREEN LIGHT LASER TURP (TRANSURETHRAL RESECTION OF PROSTATE;  Surgeon: Donnice Brooks, MD;  Location: [facility];  Service: Urology;  Laterality: N/A;   HERNIA REPAIR  infant   KNEE ARTHROSCOPY W/ MENISCECTOMY Bilateral left  10-04-2008/  right 11-09-2007   and chondraplasty/  synovectomy   TRANSURETHRAL RESECTION OF PROSTATE N/A 05/18/2019   Procedure: TRANSURETHRAL RESECTION OF THE PROSTATE (TURP);  Surgeon: Brooks Donnice, MD;  Location: [facility];  Service: Urology;  Laterality: N/A;   TRANSURETHRAL RESECTION OF PROSTATE N/A 03/19/2022   Procedure: CYSTOSCOPY/ TRANSURETHRAL RESECTION OF THE PROSTATE (TURP) RESIDUAL;  Surgeon: Brooks Donnice, MD;  Location: [facility];  Service: Urology;  Laterality: N/A;   VASECTOMY  1980's     PREVIOUS MEDICATIONS:   CURRENT MEDICATIONS:  Outpatient Encounter Medications as of 05/27/2024  Medication Sig   aspirin  81 MG tablet Take 1 tablet (81 mg total) by mouth daily.   Cholecalciferol (VITAMIN D3 PO) Take 1,000 Units by mouth daily.   cyanocobalamin (VITAMIN B12) 1000 MCG tablet 1 tablet Orally Once a day   Cyanocobalamin 250 MCG LOZG Take 250 mcg by mouth daily. (Patient not taking: Reported on 11/10/2023)   donepezil  (ARICEPT ) 10 MG tablet TAKE HALF TABLET (5 MG) DAILY FOR 2 WEEKS, THEN INCREASE TO THE FULL TABLET AT 10 MG DAILY   finasteride (PROSCAR) 5 MG  tablet Take 5 mg by mouth daily. (Patient not taking: Reported on 05/24/2024)   hyoscyamine (LEVSIN SL) 0.125 MG SL tablet    mirtazapine (REMERON) 7.5 MG tablet 1 tablet before bedtime for one week then increase to 2 tabs Orally Once a day for 30 days (Patient not taking: Reported on 05/24/2024)   rosuvastatin  (CRESTOR ) 10 MG tablet TAKE 1 TABLET BY MOUTH EVERY DAY   No facility-administered encounter medications on file as of 05/27/2024.     Objective:     PHYSICAL EXAMINATION:    VITALS:  There were no vitals filed for this visit.  GEN:  The patient appears stated age and is in NAD. HEENT:  Normocephalic, atraumatic.   Neurological examination:  General: NAD, well-groomed, appears stated age. Orientation: The patient is alert. Oriented to person, place and not to date.*** Cranial nerves: There is good facial symmetry.The speech is fluent and clear. No aphasia or dysarthria. Fund of knowledge is appropriate. Recent memory impaired and remote  memory is normal.  Attention and concentration are normal.  Able to name objects and repeat phrases.  Hearing is intact to conversational tone ***.   Delayed recall *** Sensation: Sensation is intact to light touch throughout Motor: Strength is at least antigravity x4. DTR's 2/4 in UE/LE      04/15/2023   12:00 PM 05/08/2020   11:00 AM  Montreal Cognitive Assessment   Visuospatial/ Executive (0/5) 5 4  Naming (0/3) 3 3  Attention: Read list of digits (0/2) 2 2  Attention: Read list of letters (0/1) 1 1  Attention: Serial 7 subtraction starting at 100 (0/3) 3 3  Language: Repeat phrase (0/2) 2 2  Language : Fluency (0/1) 1 1  Abstraction (0/2) 2 2  Delayed Recall (0/5) 2 1  Orientation (0/6) 6 6  Total 27 25  Adjusted Score (based on education) 27 25        No data to display             Movement examination: Tone: There is normal tone in the UE/LE Abnormal movements:  no tremor.  No myoclonus.  No asterixis.    Coordination:  There is no decremation with RAM's. Normal finger to nose  Gait and Station: The patient has no difficulty arising out of a deep-seated chair without the use of the hands. The patient's stride length is good.  Gait is cautious and narrow.   Thank you for allowing us  the opportunity to participate in the care of this nice patient. Please do not hesitate to contact us  for any questions or concerns.   Total time spent on today's visit was *** minutes dedicated to this patient today, preparing to see patient, examining the patient, ordering tests and/or medications and counseling the patient, documenting clinical information in the EHR or other health record, independently interpreting results and communicating results to the patient/family, discussing treatment and goals, answering patient's questions and coordinating care.  Cc:  Aisha Harvey, MD  Camie Sevin 05/26/2024 6:07 AM

## 2024-05-27 ENCOUNTER — Ambulatory Visit: Admitting: Physician Assistant

## 2024-05-27 VITALS — BP 144/80 | HR 64 | Ht 73.0 in | Wt 174.0 lb

## 2024-05-27 DIAGNOSIS — R413 Other amnesia: Secondary | ICD-10-CM | POA: Diagnosis not present

## 2024-05-27 DIAGNOSIS — M6281 Muscle weakness (generalized): Secondary | ICD-10-CM | POA: Diagnosis not present

## 2024-05-27 DIAGNOSIS — G3184 Mild cognitive impairment, so stated: Secondary | ICD-10-CM

## 2024-05-27 NOTE — Patient Instructions (Addendum)
 It was a pleasure to see you today at our office.   Recommendations:  Continue donepezil   10 mg daily  June 2 at 11:30       For psychiatric meds, mood meds: Please have your primary care physician manage these medications.  If you have any severe symptoms of a stroke, or other severe issues such as confusion,severe chills or fever, etc call 911 or go to the ER as you may need to be evaluated further     For assessment of decision of mental capacity and competency:  Call Dr. Rosaline Nine, geriatric psychiatrist at [phone]  Counseling regarding caregiver distress, including caregiver depression, anxiety and issues regarding community resources, adult day care programs, adult living facilities, or memory care questions:  please contact your  Primary Doctor's Social Worker   Whom to call: Memory  decline, memory medications: Call our office [phone]    [email or web address]   RECOMMENDATIONS FOR ALL PATIENTS WITH MEMORY PROBLEMS: 1. Continue to exercise (Recommend 30 minutes of walking everyday, or 3 hours every week) 2. Increase social interactions - continue going to [place] and enjoy social gatherings with friends and family 3. Eat healthy, avoid fried foods and eat more fruits and vegetables 4. Maintain adequate blood pressure, blood sugar, and blood cholesterol level. Reducing the risk of stroke and cardiovascular disease also helps promoting better memory. 5. Avoid stressful situations. Live a simple life and avoid aggravations. Organize your time and prepare for the next day in anticipation. 6. Sleep well, avoid any interruptions of sleep and avoid any distractions in the bedroom that may interfere with adequate sleep quality 7. Avoid sugar, avoid sweets as there is a strong link between excessive sugar intake, diabetes, and cognitive impairment We discussed the Mediterranean diet, which has been shown to help  patients reduce the risk of progressive memory disorders and reduces cardiovascular risk. This includes eating fish, eat fruits and green leafy vegetables, nuts like almonds and hazelnuts, walnuts, and also use olive oil. Avoid fast foods and fried foods as much as possible. Avoid sweets and sugar as sugar use has been linked to worsening of memory function.  There is always a concern of gradual progression of memory problems. If this is the case, then we may need to adjust level of care according to patient needs. Support, both to the patient and caregiver, should then be put into place.         FALL PRECAUTIONS: Be cautious when walking. Scan the area for obstacles that may increase the risk of trips and falls. When getting up in the mornings, sit up at the edge of the bed for a few minutes before getting out of bed. Consider elevating the bed at the head end to avoid drop of blood pressure when getting up. Walk always in a well-lit room (use night lights in the walls). Avoid area rugs or power cords from appliances in the middle of the walkways. Use a walker or a cane if necessary and consider physical therapy for balance exercise. Get your eyesight checked regularly.  FINANCIAL OVERSIGHT: Supervision, especially oversight when making financial decisions or transactions is also recommended.  HOME SAFETY: Consider the safety of the kitchen when operating appliances like stoves, microwave oven, and blender. Consider having supervision and share cooking responsibilities until no longer able to participate in those. Accidents with firearms and other hazards in the house should be identified and addressed as well.   ABILITY TO BE LEFT ALONE: If patient is unable to  contact 911 operator, consider using LifeLine, or when the need is there, arrange for someone to stay with patients. Smoking is a fire hazard, consider supervision or cessation. Risk of wandering should be assessed by caregiver and if detected  at any point, supervision and safe proof recommendations should be instituted.  MEDICATION SUPERVISION: Inability to self-administer medication needs to be constantly addressed. Implement a mechanism to ensure safe administration of the medications.      Mediterranean Diet A Mediterranean diet refers to food and lifestyle choices that are based on the traditions of countries located on the [place]. This way of eating has been shown to help prevent certain conditions and improve outcomes for people who have chronic diseases, like kidney disease and heart disease. What are tips for following this plan? Lifestyle  Cook and eat meals together with your family, when possible. Drink enough fluid to keep your urine clear or pale yellow. Be physically active every day. This includes: Aerobic exercise like running or swimming. Leisure activities like gardening, walking, or housework. Get 7-8 hours of sleep each night. If recommended by your health care provider, drink red wine in moderation. This means 1 glass a day for nonpregnant women and 2 glasses a day for men. A glass of wine equals 5 oz (150 mL). Reading food labels  Check the serving size of packaged foods. For foods such as rice and pasta, the serving size refers to the amount of cooked product, not dry. Check the total fat in packaged foods. Avoid foods that have saturated fat or trans fats. Check the ingredients list for added sugars, such as corn syrup. Shopping  At the grocery store, buy most of your food from the areas near the walls of the store. This includes: Fresh fruits and vegetables (produce). Grains, beans, nuts, and seeds. Some of these may be available in unpackaged forms or large amounts (in bulk). Fresh seafood. Poultry and eggs. Low-fat dairy products. Buy whole ingredients instead of prepackaged foods. Buy fresh fruits and vegetables in-season from local farmers markets. Buy frozen fruits and vegetables in  resealable bags. If you do not have access to quality fresh seafood, buy precooked frozen shrimp or canned fish, such as tuna, salmon, or sardines. Buy small amounts of raw or cooked vegetables, salads, or olives from the deli or salad bar at your store. Stock your pantry so you always have certain foods on hand, such as olive oil, canned tuna, canned tomatoes, rice, pasta, and beans. Cooking  Cook foods with extra-virgin olive oil instead of using butter or other vegetable oils. Have meat as a side dish, and have vegetables or grains as your main dish. This means having meat in small portions or adding small amounts of meat to foods like pasta or stew. Use beans or vegetables instead of meat in common dishes like chili or lasagna. Experiment with different cooking methods. Try roasting or broiling vegetables instead of steaming or sauteing them. Add frozen vegetables to soups, stews, pasta, or rice. Add nuts or seeds for added healthy fat at each meal. You can add these to yogurt, salads, or vegetable dishes. Marinate fish or vegetables using olive oil, lemon juice, garlic, and fresh herbs. Meal planning  Plan to eat 1 vegetarian meal one day each week. Try to work up to 2 vegetarian meals, if possible. Eat seafood 2 or more times a week. Have healthy snacks readily available, such as: Vegetable sticks with hummus. Greek yogurt. Fruit and nut trail mix. Eat balanced  meals throughout the week. This includes: Fruit: 2-3 servings a day Vegetables: 4-5 servings a day Low-fat dairy: 2 servings a day Fish, poultry, or lean meat: 1 serving a day Beans and legumes: 2 or more servings a week Nuts and seeds: 1-2 servings a day Whole grains: 6-8 servings a day Extra-virgin olive oil: 3-4 servings a day Limit red meat and sweets to only a few servings a month What are my food choices? Mediterranean diet Recommended Grains: Whole-grain pasta. Brown rice. Bulgar wheat. Polenta. Couscous.  Whole-wheat bread. [place]. Vegetables: Artichokes. Beets. Broccoli. Cabbage. Carrots. Eggplant. Green beans. Chard. Kale. Spinach. Onions. Leeks. Peas. Squash. Tomatoes. Peppers. Radishes. Fruits: Apples. Apricots. Avocado. Berries. Bananas. Cherries. Dates. Figs. Grapes. Lemons. Melon. Oranges. Peaches. Plums. Pomegranate. Meats and other protein foods: Beans. Almonds. Sunflower seeds. Pine nuts. Peanuts. Cod. Salmon. Scallops. Shrimp. Tuna. Tilapia. Clams. Oysters. Eggs. Dairy: Low-fat milk. Cheese. Greek yogurt. Beverages: Water . Red wine. Herbal tea. Fats and oils: Extra virgin olive oil. Avocado oil. Grape seed oil. Sweets and desserts: Greek yogurt with honey. Baked apples. Poached pears. Trail mix. Seasoning and other foods: Basil. Cilantro. Coriander. Cumin. Mint. Parsley. Sage. Rosemary. Tarragon. Garlic. Oregano. Thyme. Pepper. Balsalmic vinegar. Tahini. Hummus. Tomato sauce. Olives. Mushrooms. Limit these Grains: Prepackaged pasta or rice dishes. Prepackaged cereal with added sugar. Vegetables: Deep fried potatoes (french fries). Fruits: Fruit canned in syrup. Meats and other protein foods: Beef. Pork. Lamb. Poultry with skin. Hot dogs. [place]. Dairy: Ice cream. Sour cream. Whole milk. Beverages: Juice. Sugar-sweetened soft drinks. Beer. Liquor and spirits. Fats and oils: Butter. Canola oil. Vegetable oil. Beef fat (tallow). Lard. Sweets and desserts: Cookies. Cakes. Pies. Candy. Seasoning and other foods: Mayonnaise. Premade sauces and marinades. The items listed may not be a complete list. Talk with your dietitian about what dietary choices are right for you. Summary The Mediterranean diet includes both food and lifestyle choices. Eat a variety of fresh fruits and vegetables, beans, nuts, seeds, and whole grains. Limit the amount of red meat and sweets that you eat. Talk with your health care provider about whether it is safe for you to drink red wine in moderation. This  means 1 glass a day for nonpregnant women and 2 glasses a day for men. A glass of wine equals 5 oz (150 mL). This information is not intended to replace advice given to you by your health care provider. Make sure you discuss any questions you have with your health care provider. Document Released: 02/15/2016 Document Revised: 03/19/2016 Document Reviewed: 02/15/2016 Elsevier Interactive Patient Education  2017 [employer].

## 2024-06-01 ENCOUNTER — Other Ambulatory Visit: Payer: Medicare Other

## 2024-06-01 ENCOUNTER — Ambulatory Visit: Payer: Medicare Other | Admitting: Oncology

## 2024-06-01 DIAGNOSIS — M6281 Muscle weakness (generalized): Secondary | ICD-10-CM | POA: Diagnosis not present

## 2024-06-03 ENCOUNTER — Other Ambulatory Visit: Payer: Self-pay | Admitting: Physician Assistant

## 2024-06-08 DIAGNOSIS — H524 Presbyopia: Secondary | ICD-10-CM | POA: Diagnosis not present

## 2024-06-08 DIAGNOSIS — H5203 Hypermetropia, bilateral: Secondary | ICD-10-CM | POA: Diagnosis not present

## 2024-06-08 DIAGNOSIS — H52223 Regular astigmatism, bilateral: Secondary | ICD-10-CM | POA: Diagnosis not present

## 2024-06-08 DIAGNOSIS — M6281 Muscle weakness (generalized): Secondary | ICD-10-CM | POA: Diagnosis not present

## 2024-06-08 DIAGNOSIS — H53143 Visual discomfort, bilateral: Secondary | ICD-10-CM | POA: Diagnosis not present

## 2024-06-08 DIAGNOSIS — Z961 Presence of intraocular lens: Secondary | ICD-10-CM | POA: Diagnosis not present

## 2024-06-10 DIAGNOSIS — M6281 Muscle weakness (generalized): Secondary | ICD-10-CM | POA: Diagnosis not present

## 2024-06-16 ENCOUNTER — Ambulatory Visit: Payer: Self-pay

## 2024-06-16 ENCOUNTER — Institutional Professional Consult (permissible substitution): Payer: Medicare Other | Admitting: Psychology

## 2024-06-23 DIAGNOSIS — I872 Venous insufficiency (chronic) (peripheral): Secondary | ICD-10-CM | POA: Diagnosis not present

## 2024-06-23 DIAGNOSIS — R0989 Other specified symptoms and signs involving the circulatory and respiratory systems: Secondary | ICD-10-CM | POA: Diagnosis not present

## 2024-06-23 DIAGNOSIS — Z6823 Body mass index (BMI) 23.0-23.9, adult: Secondary | ICD-10-CM | POA: Diagnosis not present

## 2024-06-23 DIAGNOSIS — M79672 Pain in left foot: Secondary | ICD-10-CM | POA: Diagnosis not present

## 2024-06-23 DIAGNOSIS — M79671 Pain in right foot: Secondary | ICD-10-CM | POA: Diagnosis not present

## 2024-07-07 ENCOUNTER — Telehealth: Payer: Self-pay | Admitting: *Deleted

## 2024-07-07 NOTE — Telephone Encounter (Signed)
 Nicholas Mahoney had called to obtain his f/u appointments. Informed her that the 6 month lab is on 11/22/24 at 0900. She requests the 1 year f/u to be on 11/12 at 0900 and to be called with appointment in February, as [place]. She adds that she can check Mychart as well. Scheduling message sent.

## 2024-07-09 ENCOUNTER — Encounter: Payer: Medicare Other | Admitting: Psychology

## 2024-07-20 ENCOUNTER — Other Ambulatory Visit: Payer: Self-pay | Admitting: Vascular Surgery

## 2024-07-20 DIAGNOSIS — R0989 Other specified symptoms and signs involving the circulatory and respiratory systems: Secondary | ICD-10-CM

## 2024-08-18 ENCOUNTER — Ambulatory Visit (HOSPITAL_COMMUNITY)

## 2024-08-18 ENCOUNTER — Ambulatory Visit (HOSPITAL_COMMUNITY): Admitting: Vascular Surgery

## 2024-11-22 ENCOUNTER — Inpatient Hospital Stay

## 2024-12-21 ENCOUNTER — Ambulatory Visit: Admitting: Physician Assistant

## 2025-05-19 ENCOUNTER — Inpatient Hospital Stay

## 2025-05-19 ENCOUNTER — Inpatient Hospital Stay: Admitting: Oncology
